# Patient Record
Sex: Male | Born: 1985 | Race: White | Hispanic: Yes | Marital: Single | Smoking: Never smoker
Health system: Southern US, Community
[De-identification: ages and names within clinical notes are randomized; demographics above are authoritative.]

## PROBLEM LIST (undated history)

## (undated) DIAGNOSIS — R519 Headache, unspecified: Secondary | ICD-10-CM

## (undated) DIAGNOSIS — I10 Essential (primary) hypertension: Secondary | ICD-10-CM

## (undated) DIAGNOSIS — E119 Type 2 diabetes mellitus without complications: Secondary | ICD-10-CM

## (undated) DIAGNOSIS — U071 COVID-19: Secondary | ICD-10-CM

## (undated) DIAGNOSIS — F1910 Other psychoactive substance abuse, uncomplicated: Secondary | ICD-10-CM

## (undated) DIAGNOSIS — K8020 Calculus of gallbladder without cholecystitis without obstruction: Secondary | ICD-10-CM

## (undated) DIAGNOSIS — F603 Borderline personality disorder: Secondary | ICD-10-CM

## (undated) HISTORY — PX: WISDOM TOOTH EXTRACTION: SHX21

## (undated) HISTORY — PX: CHOLECYSTECTOMY: SHX55

---
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
## 2006-03-17 ENCOUNTER — Ambulatory Visit: Payer: Self-pay | Admitting: Psychiatry

## 2006-03-17 ENCOUNTER — Inpatient Hospital Stay (HOSPITAL_COMMUNITY): Admission: AD | Admit: 2006-03-17 | Discharge: 2006-03-21 | Payer: Self-pay | Admitting: Psychiatry

## 2006-12-07 ENCOUNTER — Ambulatory Visit: Payer: Self-pay | Admitting: *Deleted

## 2006-12-07 ENCOUNTER — Inpatient Hospital Stay (HOSPITAL_COMMUNITY): Admission: AD | Admit: 2006-12-07 | Discharge: 2006-12-14 | Payer: Self-pay | Admitting: *Deleted

## 2009-01-11 ENCOUNTER — Inpatient Hospital Stay (HOSPITAL_COMMUNITY): Admission: RE | Admit: 2009-01-11 | Discharge: 2009-01-16 | Payer: Self-pay | Admitting: Psychiatry

## 2009-01-11 ENCOUNTER — Ambulatory Visit: Payer: Self-pay | Admitting: Psychiatry

## 2010-07-02 LAB — GLUCOSE, CAPILLARY
Glucose-Capillary: 110 mg/dL — ABNORMAL HIGH (ref 70–99)
Glucose-Capillary: 112 mg/dL — ABNORMAL HIGH (ref 70–99)
Glucose-Capillary: 120 mg/dL — ABNORMAL HIGH (ref 70–99)
Glucose-Capillary: 129 mg/dL — ABNORMAL HIGH (ref 70–99)
Glucose-Capillary: 149 mg/dL — ABNORMAL HIGH (ref 70–99)
Glucose-Capillary: 178 mg/dL — ABNORMAL HIGH (ref 70–99)
Glucose-Capillary: 190 mg/dL — ABNORMAL HIGH (ref 70–99)
Glucose-Capillary: 192 mg/dL — ABNORMAL HIGH (ref 70–99)
Glucose-Capillary: 204 mg/dL — ABNORMAL HIGH (ref 70–99)
Glucose-Capillary: 218 mg/dL — ABNORMAL HIGH (ref 70–99)
Glucose-Capillary: 255 mg/dL — ABNORMAL HIGH (ref 70–99)
Glucose-Capillary: 313 mg/dL — ABNORMAL HIGH (ref 70–99)
Glucose-Capillary: 317 mg/dL — ABNORMAL HIGH (ref 70–99)
Glucose-Capillary: 320 mg/dL — ABNORMAL HIGH (ref 70–99)
Glucose-Capillary: 344 mg/dL — ABNORMAL HIGH (ref 70–99)
Glucose-Capillary: 370 mg/dL — ABNORMAL HIGH (ref 70–99)
Glucose-Capillary: 382 mg/dL — ABNORMAL HIGH (ref 70–99)
Glucose-Capillary: 390 mg/dL — ABNORMAL HIGH (ref 70–99)
Glucose-Capillary: 49 mg/dL — ABNORMAL LOW (ref 70–99)
Glucose-Capillary: 53 mg/dL — ABNORMAL LOW (ref 70–99)
Glucose-Capillary: 53 mg/dL — ABNORMAL LOW (ref 70–99)
Glucose-Capillary: 57 mg/dL — ABNORMAL LOW (ref 70–99)
Glucose-Capillary: 85 mg/dL (ref 70–99)
Glucose-Capillary: 85 mg/dL (ref 70–99)
Glucose-Capillary: 90 mg/dL (ref 70–99)
Glucose-Capillary: 96 mg/dL (ref 70–99)

## 2010-07-02 LAB — COMPREHENSIVE METABOLIC PANEL
ALT: 25 U/L (ref 0–53)
AST: 24 U/L (ref 0–37)
Albumin: 4.2 g/dL (ref 3.5–5.2)
Alkaline Phosphatase: 115 U/L (ref 39–117)
BUN: 10 mg/dL (ref 6–23)
CO2: 31 mEq/L (ref 19–32)
Calcium: 9.6 mg/dL (ref 8.4–10.5)
Chloride: 103 mEq/L (ref 96–112)
Creatinine, Ser: 0.59 mg/dL (ref 0.4–1.5)
GFR calc Af Amer: >60 mL/min (ref >60)
GFR calc non Af Amer: >60 mL/min (ref >60)
Glucose, Bld: 50 mg/dL — ABNORMAL LOW (ref 70–99)
Potassium: 3.9 mEq/L (ref 3.5–5.1)
Sodium: 140 mEq/L (ref 135–145)
Total Bilirubin: 0.6 mg/dL (ref 0.3–1.2)
Total Protein: 7.7 g/dL (ref 6.0–8.3)

## 2010-07-02 LAB — CBC
HCT: 43 % (ref 39.0–52.0)
Hemoglobin: 14.5 g/dL (ref 13.0–17.0)
MCHC: 33.7 g/dL (ref 30.0–36.0)
MCV: 91.1 fL (ref 78.0–100.0)
Platelets: 309 10*3/uL (ref 150–400)
RBC: 4.72 MIL/uL (ref 4.22–5.81)
RDW: 13 % (ref 11.5–15.5)
WBC: 7.4 10*3/uL (ref 4.0–10.5)

## 2010-07-02 LAB — TSH: TSH: 2.37 u[IU]/mL (ref 0.350–4.500)

## 2010-08-11 NOTE — Discharge Summary (Signed)
NAME:  Raymond [name], Raymond [name] NO.:  [id number]   MEDICAL RECORD NO.:  [id number]          PATIENT TYPE:  IPS   LOCATION:  0507                          FACILITY:  BH   PHYSICIAN:  Jasmine Pang, M.D. DATE OF BIRTH:  [birth year]   DATE OF ADMISSION:  12/07/2006  DATE OF DISCHARGE:  12/14/2006                               DISCHARGE SUMMARY   IDENTIFICATION:  This is a 25 year old white male who was admitted on a  voluntary basis from [facility] ED on December 07, 2006.   HISTORY OF PRESENT ILLNESS:  The patient states he was at [employer] and  stole a bottle of maximum strength of Mucinex.  He took all the pills in  the bathroom.  Their staff realized that there was something amiss and  found his empty bottle of pills.  Security was called and apprehended  him.  Then, 911 was called and he was taken to the ED at [facility].  He states he has lost his job because of insubordination to  his supervisor.  He has been abusing dextromethorphan about 20 to 30  tablets on a binge in the past 2 weeks daily with a break about every  fourth day.  He has no transportation.  He has a poor relationship with  his father.  He sees Dr. Bayard Males in [place].  This is the third or  fourth [facility] admission.  He was at [facility] in 2006  and 2007.  He has 2 pending charges for paraphernalia.  He has cyber-  stalking charge.  He also states he shoplifts.  He says his mother has  bipolar disorder.  He drinks alcohol every 2 to 3 weeks as indicated  above.  He abuses dextromethorphan.  He has diabetes type 1,  uncontrolled, and migraine headaches.  He is supposed to be on Topamax,  but he is not taking this.  He is also on insulin 70/30, 30 units in the  morning and at h.s.  He is allergic to IBUPROFEN.  He has been on  numerous psychiatric medicines in the past including Cymbalta, which  helped; Effexor, which gave him withdrawal effects; Depakote, which  caused him to be drowsy; Seroquel caused him to be tired; Lexapro  worsened his symptoms; Prozac; and gabapentin.   PHYSICAL EXAMINATION:  This was done at the [facility] ED.  He  was given 1 g/kg of charcoal for the overdose on dextromethorphan  (Mucinex).  There were no other acute physical problems.   DIAGNOSTIC STUDIES:  Included a BMET, which had a slightly decreased CO2  of 95 and a slightly decreased creatinine of 0.57, platelets were  slightly increased to 285,000.  CBC was within normal limits.  Alcohol  level was 0.5.  UDS was negative.  Liver profile was remarkable for an  AST of 105 and an ALT of 171 and alkaline phosphatase of 191.   HOSPITAL COURSE:  Upon admission, the patient was started on a NovoLog  glycemic control protocol at a moderate strength.  He was also started  on Topamax 25 mg p.o.  daily and trazodone 50 mg p.o. q.h.s.  He was  started on 70/30 insulin 30 units in the morning and 30 units at p.m.  Topamax was discontinued and trazodone was discontinued.  He was instead  started on Ambien 5 mg p.o. q.h.s. 1 to 2 tablets p.r.n.  He was started  on Lamisil 1% cream to affected ringworm area on his arms b.i.d.  On  December 09, 2006, he was started on Celexa 20 mg p.o. daily and  Risperdal 0.5 mg p.o. q.h.s.  He was also started on Risperdal 0.5 mg M-  Tabs p.o. q.6h. p.r.n. anxiety.  The patient tolerated these medications  well with no significant side effects.  He was appropriate in the  hospital and able to participate in unit therapeutic groups and  activities.  He was talkative and cooperative with me.  He states he  feels he is bipolar.  He had no current psychiatric treatment.  He  admitted to be using dextromethorphan, alcohol, marijuana, mushrooms,  cocaine, and ecstasy.  He discussed his demoralization about the poor  relationship he has with his father.  He states his father would not let  him see his brother because he is afraid he will turn  out like me.  He  had some difficulty sleeping with middle of the night awakening.  Mood  continued to be somewhat angry and depressed and anxious.  Initially, as  hospitalization progressed, his mental status improved.  He was still  somewhat anxious with some dysphoria, but improved from admission  status.  He was still having some middle of the night awakening.  There  was no suicidal or homicidal ideation.  No auditory or visual  hallucinations.  He talked about the breakup with his girlfriend, which  he states had been very difficult for him.  He plans to go home to live  with his older brother.  On December 14, 2006, mental status had  improved markedly from admission status.  The patient was friendly and  cooperative with good eye contact.  Speech was normal rate and flow.  Psychomotor activity was within normal limits.  Mood, euthymic.  Affect,  wide range.  There was no suicidal or homicidal ideation.  No thoughts  of self-injurious behavior.  No auditory or visual hallucinations.  No  paranoia or delusions.  Thoughts were logical and goal directed.  Thought content, no predominant theme.  Cognitive was grossly back to  baseline.  It was felt the patient was safe to be discharged home today  to live with his brother.   DISCHARGE DIAGNOSES:  AXIS I:  Bipolar disorder, not otherwise  specified.  AXIS II:  Features of personality disorder, not otherwise specified.  AXIS III:  Ringworm on his left arm, diabetes mellitus type 1,  uncontrolled.  AXIS IV:  Severe (problems with primary support group, housing problem,  economic problem, problems related to legal system, medical problems,  burden of psychiatric illness).  AXIS V:  Global assessment of functioning upon discharge was 50.  Global  assessment of functioning upon admission 41.  Global assessment of  functioning highest past year 60 to 65.   DISCHARGE PLANS:  There were no specific activity level or dietary   restrictions.   POST HOSPITAL CARE PLANS:  The patient will be seen at the [facility] [place] Thursday, December 15, 2006, at 9:00 a.m.   DISCHARGE MEDICATIONS:  1. He was started on insulin NovoLog  mix 70/30, 30 units in the      morning and 15 units at 5:00 p.m.  2. Lamisil 1% cream twice daily to affected area.  3. Celexa 20 mg daily.  4. Risperdal 0.5 mg M-Tab at bedtime.  5. Ambien 5 mg at bedtime if needed.   He was also directed to go to the [facility] to establish  medical care for his diabetes.      Jasmine Pang, M.D.  Electronically Signed     BHS/MEDQ  D:  12/14/2006  T:  12/14/2006  Job:  [phone]

## 2010-08-14 NOTE — Discharge Summary (Signed)
NAME:  Raymond [name], Raymond [name]              ACCOUNT NO.:  [id number]   MEDICAL RECORD NO.:  [id number]          PATIENT TYPE:  IPS   LOCATION:  0502                          FACILITY:  [facility]   PHYSICIAN:  Anselm Jungling, MD  DATE OF BIRTH:  1985-08-14   DATE OF ADMISSION:  03/17/2006  DATE OF DISCHARGE:  03/21/2006                               DISCHARGE SUMMARY   IDENTIFYING DATA AND REASON FOR ADMISSION:  The patient is a 25 year old  single Caucasian male who presented with lacerations that he initially  reported were from falling down a well and being attacked by a mountain  lion.  He subsequently admitted that this was not true.  He apparently  had inflicted the injuries to communicate something to his girlfriend.  He had been taking Effexor prior to admission, but stopped during the  week before he came in.  He had no known chemical dependency issues.  Please refer to the admission note for further details pertaining to the  symptoms, circumstances and history that led to his hospitalization.  He  was given an initial Axis I diagnosis of bipolar disorder by history,  currently depressed, and rule out adjustment disorder with mixed  disturbance of emotions and conduct.   MEDICAL AND LABORATORY:  The patient had his laceration injuries treated  at the emergency department with suturing.  He presented with a bandaged  left arm.  He was alert, fully oriented, pleasant and polite, and once  he disavowed the mountain lion story, appeared to be a good historian.  His mood was depressed with a flat affect.  There were no signs or  symptoms of psychosis or thought disorder.  He denied any suicidal  ideation, and verbalized a desire for help.   He was restarted on his usual Effexor XR 150 mg daily.  To further  stabilize his bipolar disorder, he was started on a trial of Depakote at  bedtime.   He was involved in various therapeutic groups and activities designed  help him acquire better  coping skills, a better understanding of his  underlying disorders and dynamics, and the development of an aftercare  plan.   The patient came to [place] with insulin-dependent diabetes mellitus, and had  significant blood pressure fluctuations over the first 24 hours of his  stay.  This was reviewed with the nurse practitioner, and the diabetic  care coordinator for the hospital was contacted.   On the fourth hospital day there was a family session involving the  patient and his mother.  They discussed helping the patient to have  appropriate support in managing stress when he goes home.  They  discussed the diagnosis of bipolar disorder.  Coping skills were  discussed.  The family and patient were given information on suicide  prevention as well as the suicide prevention pamphlet and the crisis  hotline numbers card, which they agreed to use if needed.   The patient did reasonably well in the milieu program, and by the fifth  hospital day appeared appropriate for discharge.  He agreed to the  following aftercare plan.  AFTERCARE:  The patient was to follow-up with [facility] with an appointment on April 11, 2006 with Bartholomew Crews, and  with Dr. __________ on April 05, 2006.   DISCHARGE MEDICATIONS:  Effexor XR 150 mg daily, Depakote ER 500 mg  q.h.s., and his usual insulin regimen.   He was encouraged to follow up regarding his wound care and diabetic  management with his usual physician.   DISCHARGE DIAGNOSES:  AXIS I:  Bipolar disorder, type 2, most recently  depressed.   AXIS II: Deferred.   AXIS III: Self-inflicted lacerations.   AXIS IV: Stressors severe.   AXIS V: GAF on discharge 60.      Anselm Jungling, MD  Electronically Signed     SPB/MEDQ  D:  04/08/2006  T:  04/10/2006  Job:  [id number]

## 2010-08-14 NOTE — Discharge Summary (Signed)
NAME:  Raymond [name], Raymond [name]              ACCOUNT NO.:  [id number]   MEDICAL RECORD NO.:  [id number]          PATIENT TYPE:  IPS   LOCATION:  0502                          FACILITY:  BH   PHYSICIAN:  Geoffery Lyons, M.D.      DATE OF BIRTH:  1985/05/04   DATE OF ADMISSION:  08/19/2004  DATE OF DISCHARGE:  08/24/2004                                 DISCHARGE SUMMARY   CHIEF COMPLAINT AND PRESENT ILLNESS:  This was the first admission to [facility] for this 25 year old Hispanic male, single,  involuntarily committed.  Apparently, he has not been taking his insulin for  two or three days, became groggy at admission.  Diabetes mellitus was  uncontrolled.  He was admitted to the ICU and medically stabilized.  Endorsed mood swings with depression, hopelessness since high school, more  so since graduation due to poor support system.  Anxious, cutting on arms,  living out of a car, struggling with sexuality issues and diabetes mellitus  management, poor support.   PAST PSYCHIATRIC HISTORY:  First time at [facility].  No prior  admissions.  Endorsed mood swings since high school.  PCP tried Lamictal but  did not work according to his report.   ALCOHOL/DRUG HISTORY:  Pops Ritalin occasionally.   MEDICAL HISTORY:  Diabetes mellitus, insulin-dependent age 36, neuropathy  both feet.   MEDICATIONS:  Insulin 70/30 30 units in the morning, 12 units at suppertime.   PHYSICAL EXAMINATION:  Performed and failed to show any acute findings.   LABORATORY DATA:  CBC within normal limits.  Blood chemistry with glucose  398.  Liver enzymes with SGOT 39, SGPT 28, alkaline phosphatase 124,  hemoglobin A1C 13.7.  Hepatitis apparently was negative.  TSH 1.871.   MENTAL STATUS EXAM:  Fully alert, pleasant, cooperative male with lip  piercings.  Polite, bright affect, somewhat expansive.  Speech normal rate,  tempo and production.  Mood elevated.  Affect broad with some giddiness.  Thought  processes logical, coherent and relevant.  Suicidal ruminations.  No  active plan.  Endorsed racing thoughts and agitation.  No delusions.  No  hallucinations.  Cognition was well-preserved.   ADMISSION DIAGNOSES:   AXIS I:  Rule out bipolar disorder, depressed.   AXIS II:  No diagnosis.   AXIS III:  1.  Diabetes mellitus, type 1.  2.  Tinea corporis.  3.  Diabetic neuropathy.   AXIS IV:  Moderate.   AXIS V:  Global Assessment of Functioning upon admission 30; highest Global  Assessment of Functioning in the last year 65-70.   HOSPITAL COURSE:  He was admitted.  He was started in individual and group  psychotherapy.  He was initially given Ambien for sleep.  Maintained on his  insulin Novolin 70/30 30 units in the morning, 12 units in the afternoon.  Internal medicine was consulted due to uncontrolled diabetes.  We followed  the recommendations.  He was started on Lexapro 5 mg per day and Depakote ER  250 mg twice a day.  He did endorse that he might have been upset with his  diabetes mellitus and said that he would rather be dead.  He had said he  would not kill himself.  Endorsed cutting as a way of dealing with the  emotional pain.  Not to hurt himself.  Endorsed he has been living by  himself in a car.  Works in a Research scientist (medical).  Claimed he liked it.  Also endorsed that he was ready to move on.  He was going back to school  next semester.  Could not afford to go immediately after high school due to  financial difficulties.  The Lamictal trial that was already discussed, he  claimed, made him feel worse.  Endorsed that at times, he got too high,  different from feeling normal.  Endorsed multiple traumatic events in his  life that have affected him.  He did admit to feeling very anxious around  other people, having a hard time opening up.  We continued to work with him.  He readily started feeling better.  On May 29th, he was in full contact with  reality.  There were  no suicidal ideation, no homicidal ideation, no  hallucinations, no delusions.  Endorsed he was feeling much better.  He was  motivated to doing better.  Endorsed the worse time was when he was by  himself and he gets lonely.  He feels more depressed but felt that he had  worked enough on himself and had enough coping skills that he could handle  it this time around much better.   DISCHARGE DIAGNOSES:   AXIS I:  Mood disorder not otherwise specified.   AXIS II:  No diagnosis.   AXIS III:  1.  Diabetes mellitus, type 1.  2.  Tinea corporis.  3.  Status post acute diabetic ketoacidosis.  4.  Diabetic neuropathy.   AXIS IV:  Moderate.   AXIS V:  Global Assessment of Functioning upon discharge 50-55.   DISCHARGE MEDICATIONS:  1.  Lexapro 10 mg, 1/2 daily.  2.  Novolin 40 units subcutaneous at 7 a.m.  3.  Novolin 20 units subcutaneous at 5 p.m.   FOLLOW UP:  [facility].       IL/MEDQ  D:  09/21/2004  T:  09/22/2004  Job:  [id number]

## 2010-11-05 ENCOUNTER — Ambulatory Visit (HOSPITAL_COMMUNITY)
Admission: RE | Admit: 2010-11-05 | Discharge: 2010-11-05 | Disposition: A | Discharge status: Home / Self Care | Payer: Self-pay | Attending: Psychiatry | Admitting: Psychiatry

## 2010-11-05 DIAGNOSIS — F39 Unspecified mood [affective] disorder: Secondary | ICD-10-CM | POA: Insufficient documentation

## 2011-01-08 LAB — HEPATIC FUNCTION PANEL
ALT: 70 — ABNORMAL HIGH
AST: 33
Albumin: 3.3 — ABNORMAL LOW
Alkaline Phosphatase: 141 — ABNORMAL HIGH
Bilirubin, Direct: 0.1
Indirect Bilirubin: 0.4
Total Bilirubin: 0.5
Total Protein: 6.3

## 2011-01-08 LAB — HEPATITIS PANEL, ACUTE
HCV Ab: NEGATIVE
Hep A IgM: NEGATIVE
Hep B C IgM: NEGATIVE
Hepatitis B Surface Ag: NEGATIVE

## 2011-01-08 LAB — GLUCOSE, RANDOM: Glucose, Bld: 459 — ABNORMAL HIGH

## 2012-03-29 DIAGNOSIS — E119 Type 2 diabetes mellitus without complications: Secondary | ICD-10-CM

## 2012-03-29 HISTORY — DX: Type 2 diabetes mellitus without complications: E11.9

## 2014-07-21 ENCOUNTER — Emergency Department (HOSPITAL_COMMUNITY)
Admission: EM | Admit: 2014-07-21 | Discharge: 2014-07-22 | Disposition: A | Discharge status: Home / Self Care | Payer: Self-pay | Attending: Emergency Medicine | Admitting: Emergency Medicine

## 2014-07-21 ENCOUNTER — Encounter (HOSPITAL_COMMUNITY): Payer: Self-pay | Admitting: Emergency Medicine

## 2014-07-21 ENCOUNTER — Emergency Department (HOSPITAL_COMMUNITY): Payer: Self-pay

## 2014-07-21 DIAGNOSIS — Z8719 Personal history of other diseases of the digestive system: Secondary | ICD-10-CM | POA: Insufficient documentation

## 2014-07-21 DIAGNOSIS — E119 Type 2 diabetes mellitus without complications: Secondary | ICD-10-CM | POA: Insufficient documentation

## 2014-07-21 DIAGNOSIS — T1491XA Suicide attempt, initial encounter: Secondary | ICD-10-CM

## 2014-07-21 DIAGNOSIS — F332 Major depressive disorder, recurrent severe without psychotic features: Secondary | ICD-10-CM | POA: Insufficient documentation

## 2014-07-21 DIAGNOSIS — Z720 Tobacco use: Secondary | ICD-10-CM | POA: Insufficient documentation

## 2014-07-21 DIAGNOSIS — Z794 Long term (current) use of insulin: Secondary | ICD-10-CM | POA: Insufficient documentation

## 2014-07-21 DIAGNOSIS — R45851 Suicidal ideations: Secondary | ICD-10-CM

## 2014-07-21 HISTORY — DX: Calculus of gallbladder without cholecystitis without obstruction: K80.20

## 2014-07-21 LAB — COMPREHENSIVE METABOLIC PANEL
ALT: 22 U/L (ref 0–53)
AST: 20 U/L (ref 0–37)
Albumin: 3 g/dL — ABNORMAL LOW (ref 3.5–5.2)
Alkaline Phosphatase: 100 U/L (ref 39–117)
Anion gap: 5 (ref 5–15)
BUN: 11 mg/dL (ref 6–23)
CO2: 26 mmol/L (ref 19–32)
Calcium: 8 mg/dL — ABNORMAL LOW (ref 8.4–10.5)
Chloride: 110 mmol/L (ref 96–112)
Creatinine, Ser: 0.78 mg/dL (ref 0.50–1.35)
GFR calc Af Amer: >90 mL/min (ref >90)
GFR calc non Af Amer: >90 mL/min (ref >90)
Glucose, Bld: 154 mg/dL — ABNORMAL HIGH (ref 70–99)
Potassium: 3.3 mmol/L — ABNORMAL LOW (ref 3.5–5.1)
Sodium: 141 mmol/L (ref 135–145)
Total Bilirubin: 0.3 mg/dL (ref 0.3–1.2)
Total Protein: 5.3 g/dL — ABNORMAL LOW (ref 6.0–8.3)

## 2014-07-21 LAB — CBG MONITORING, ED
Glucose-Capillary: 222 mg/dL — ABNORMAL HIGH (ref 70–99)
Glucose-Capillary: 42 mg/dL — CL (ref 70–99)
Glucose-Capillary: 56 mg/dL — ABNORMAL LOW (ref 70–99)
Glucose-Capillary: 177 mg/dL — ABNORMAL HIGH (ref 70–99)
Glucose-Capillary: 245 mg/dL — ABNORMAL HIGH (ref 70–99)

## 2014-07-21 LAB — RAPID URINE DRUG SCREEN, HOSP PERFORMED
Amphetamines: NOT DETECTED
Barbiturates: NOT DETECTED
Benzodiazepines: NOT DETECTED
Cocaine: NOT DETECTED
Opiates: NOT DETECTED
Tetrahydrocannabinol: NOT DETECTED

## 2014-07-21 LAB — I-STAT CHEM 8, ED
BUN: 13 mg/dL (ref 6–23)
Calcium, Ion: 1.28 mmol/L — ABNORMAL HIGH (ref 1.12–1.23)
Chloride: 100 mmol/L (ref 96–112)
Creatinine, Ser: 1 mg/dL (ref 0.50–1.35)
Glucose, Bld: 496 mg/dL — ABNORMAL HIGH (ref 70–99)
HCT: 46 % (ref 39.0–52.0)
Hemoglobin: 15.6 g/dL (ref 13.0–17.0)
Potassium: 3.9 mmol/L (ref 3.5–5.1)
Sodium: 138 mmol/L (ref 135–145)
TCO2: 24 mmol/L (ref 0–100)

## 2014-07-21 LAB — CBC WITH DIFFERENTIAL/PLATELET
Basophils Absolute: 0 10*3/uL (ref 0.0–0.1)
Basophils Absolute: 0 10*3/uL (ref 0.0–0.1)
Basophils Relative: 0 % (ref 0–1)
Basophils Relative: 1 % (ref 0–1)
Eosinophils Absolute: 0.1 10*3/uL (ref 0.0–0.7)
Eosinophils Absolute: 0.1 10*3/uL (ref 0.0–0.7)
Eosinophils Relative: 1 % (ref 0–5)
Eosinophils Relative: 2 % (ref 0–5)
HCT: 43.8 % (ref 39.0–52.0)
HCT: 36.1 % — ABNORMAL LOW (ref 39.0–52.0)
Hemoglobin: 15 g/dL (ref 13.0–17.0)
Hemoglobin: 12.4 g/dL — ABNORMAL LOW (ref 13.0–17.0)
Lymphocytes Relative: 41 % (ref 12–46)
Lymphocytes Relative: 45 % (ref 12–46)
Lymphs Abs: 3.1 10*3/uL (ref 0.7–4.0)
Lymphs Abs: 2.8 10*3/uL (ref 0.7–4.0)
MCH: 29.2 pg (ref 26.0–34.0)
MCH: 29.5 pg (ref 26.0–34.0)
MCHC: 34.2 g/dL (ref 30.0–36.0)
MCHC: 34.3 g/dL (ref 30.0–36.0)
MCV: 85.4 fL (ref 78.0–100.0)
MCV: 86 fL (ref 78.0–100.0)
Monocytes Absolute: 0.5 10*3/uL (ref 0.1–1.0)
Monocytes Absolute: 0.5 10*3/uL (ref 0.1–1.0)
Monocytes Relative: 6 % (ref 3–12)
Monocytes Relative: 8 % (ref 3–12)
Neutro Abs: 3.9 10*3/uL (ref 1.7–7.7)
Neutro Abs: 2.8 10*3/uL (ref 1.7–7.7)
Neutrophils Relative %: 52 % (ref 43–77)
Neutrophils Relative %: 45 % (ref 43–77)
Platelets: 364 10*3/uL (ref 150–400)
Platelets: 298 10*3/uL (ref 150–400)
RBC: 5.13 MIL/uL (ref 4.22–5.81)
RBC: 4.2 MIL/uL — ABNORMAL LOW (ref 4.22–5.81)
RDW: 12 % (ref 11.5–15.5)
RDW: 12 % (ref 11.5–15.5)
WBC: 7.6 10*3/uL (ref 4.0–10.5)
WBC: 6.1 10*3/uL (ref 4.0–10.5)

## 2014-07-21 LAB — I-STAT CG4 LACTIC ACID, ED
Lactic Acid, Venous: 1.75 mmol/L (ref 0.5–2.0)
Lactic Acid, Venous: 0.97 mmol/L (ref 0.5–2.0)

## 2014-07-21 LAB — ACETAMINOPHEN LEVEL
Acetaminophen (Tylenol), Serum: <10 ug/mL — ABNORMAL LOW (ref 10–30)
Acetaminophen (Tylenol), Serum: <10 ug/mL — ABNORMAL LOW (ref 10–30)

## 2014-07-21 LAB — CBC
HCT: 38 % — ABNORMAL LOW (ref 39.0–52.0)
Hemoglobin: 12.8 g/dL — ABNORMAL LOW (ref 13.0–17.0)
MCH: 29.1 pg (ref 26.0–34.0)
MCHC: 33.7 g/dL (ref 30.0–36.0)
MCV: 86.4 fL (ref 78.0–100.0)
Platelets: 313 10*3/uL (ref 150–400)
RBC: 4.4 MIL/uL (ref 4.22–5.81)
RDW: 12.1 % (ref 11.5–15.5)
WBC: 6.5 10*3/uL (ref 4.0–10.5)

## 2014-07-21 LAB — PROTIME-INR
INR: 0.82 (ref 0.00–1.49)
Prothrombin Time: 11.3 seconds — ABNORMAL LOW (ref 11.6–15.2)

## 2014-07-21 LAB — LIPASE, BLOOD: Lipase: 26 U/L (ref 11–59)

## 2014-07-21 LAB — ETHANOL: Alcohol, Ethyl (B): <5 mg/dL (ref 0–9)

## 2014-07-21 LAB — SALICYLATE LEVEL: Salicylate Lvl: <4 mg/dL (ref 2.8–20.0)

## 2014-07-21 MED ORDER — SODIUM CHLORIDE 0.9 % IV BOLUS (SEPSIS)
1000.0000 mL | Freq: Once | INTRAVENOUS | Status: AC
  Administered 2014-07-21: 1000 mL via INTRAVENOUS

## 2014-07-21 MED ORDER — ONDANSETRON HCL 4 MG PO TABS: 4.0000 mg | ORAL_TABLET | Freq: Three times a day (TID) | ORAL | Status: DC | PRN

## 2014-07-21 MED ORDER — ALUM & MAG HYDROXIDE-SIMETH 200-200-20 MG/5ML PO SUSP: 30.0000 mL | ORAL | Status: DC | PRN

## 2014-07-21 NOTE — ED Notes (Signed)
Attempted to call report to TCU. Will try back

## 2014-07-21 NOTE — ED Notes (Signed)
Resting quietly with eye closed. Easily arousable. Verbally responsive. Resp even and unlabored. ABC's intact. No behavior problems noted. NAD noted. IV saline lock patent and intact. Family reported plans to leave but left contact numbers : [phone] and [phone].

## 2014-07-21 NOTE — BHH Counselor (Signed)
Contacted the following facilities for placement:  NO APPROPRIATE BEDS CURRENTLY AVAILABLE.  [facility]: [facility], [facility], per [facility], per [facility], per [facility], per [facility], per [facility], per [facility], per [facility], per [facility], per [facility], per [facility] Rad[facility], per [facility], per [facility] per [facility], per [facility], per [facility], per Jonah  NO RESPONSE: [facility]   [address]Raejean Swinford [facility], [facility], [facility], [facility] Triage Specialist [phone]

## 2014-07-21 NOTE — ED Notes (Signed)
Chales AbrahamsMary Ann with poison controlled called and given update on pt's condition.

## 2014-07-21 NOTE — BH Assessment (Addendum)
Tele Assessment Note   Raymond [name] is an 29 y.o. male who presents to [facility] accompanied by mother, [name] and brother, Irving ShowsMiguel. Patient reports suicidal attempt by drinking bleach after argument with his wife. Patient reported gesture was "a self harming attempt and if it would have ended in suicide that would have been fine." Patient stated he has been having ongoing issues since 29 y/o and reported multiple suicide attempts in the past. Patient identified current stressors as his wife, work and fiances. Patient endorses depressive sx such as fatigue, irritability, worthlessness and guilt. Patient stated he sleeps more than 12 hours a day. Patient denies HI and AVH. Patient reported ongoing use of cough medication when he gets in arguments with wife. Patient stated he will take 16 -30mg  tablets, sometimes daily depending on level of stress. Patient denies any other drug use.  Patient Ox4. Patient presents with flat, depressed mood. 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 Patient currently living with his wife and 29 year old daughter. Patient reported being diabetic and taking over the counter insulin. Patient stated he currently does not see a MD for insulin.   Patient reports 5 previous hospitalizations with most recent in July 2015 at [facility]. Patient stated he last had outpatient therapy with therapist in [place] in 2009 but no current outpatient.   Mother and brother discuss concern about patient's dependence on cough medication with him having 314 year old daughter. Patient stated he is open for outpatient tx following inpatient stabilization.   Axis I: Mood Disorder Unspecified Axis II: Borderline Personality Disorder (by patient account)  Past Medical History:  Past Medical History  Diagnosis Date  . Gallstones     Past Surgical History  Procedure Laterality Date  . Cholecystectomy      Family History: History reviewed. No  pertinent family history.  Social History:  reports that he has been smoking.  He has never used smokeless tobacco. He reports that he drinks alcohol. He reports that he does not use illicit drugs.  Additional Social History:  Alcohol / Drug Use Pain Medications: none Prescriptions: none Over the Counter: Insulin (for diabetes) History of alcohol / drug use?: Yes Longest period of sobriety (when/how long): unk Negative Consequences of Use: Personal relationships Substance #1 Name of Substance 1: cough medicine 1 - Age of First Use: 22 1 - Amount (size/oz): 16- 30mg  tablets 1 - Frequency: daily- occasional "depending on level of stress" 1 - Duration: ongoing 1 - Last Use / Amount: 07/20/14  CIWA: CIWA-Ar BP: (!) 158/101 mmHg Pulse Rate: 96 COWS:    PATIENT STRENGTHS: (choose at least two) Ability for insight Average or above average intelligence Communication skills Work skills  Allergies:  Allergies  Allergen Reactions  . Tramadol Other (See Comments)    Hypoglycemia Shock  . Ibuprofen Swelling    Home Medications:  (Not in a hospital admission)  OB/GYN Status:  No LMP for male patient.  General Assessment Data Location of Assessment: [facility] Is this a Tele or Face-to-Face Assessment?: Tele Assessment Is this an Initial Assessment or a Re-assessment for this encounter?: Initial Assessment Living Arrangements: Spouse/significant other Can pt return to current living arrangement?: Yes Admission Status: Voluntary Is patient capable of signing voluntary admission?: Yes Transfer from: Acute Hospital Referral Source: Self/Family/Friend     [facility] Living Arrangements: Spouse/significant other Name of Psychiatrist: none Name of Therapist: none     Risk  to self with the past 6 months Suicidal Ideation: Yes-Currently Present Suicidal Intent: Yes-Currently Present Is patient at risk for suicide?: Yes Suicidal Plan?: Yes-Currently Present Specify  Current Suicidal Plan: drinking bleach Access to Means: Yes Specify Access to Suicidal Means: bleach in the home What has been your use of drugs/alcohol within the last 12 months?: cough meds Previous Attempts/Gestures: Yes How many times?:  (multiple) Other Self Harm Risks: unk Triggers for Past Attempts: Spouse contact Intentional Self Injurious Behavior: None Family Suicide History: Unknown Recent stressful life event(s): Conflict (Comment) (Conflict with wife) Persecutory voices/beliefs?: No Depression: Yes Depression Symptoms: Despondent, Fatigue, Guilt, Loss of interest in usual pleasures, Feeling worthless/self pity, Feeling angry/irritable Substance abuse history and/or treatment for substance abuse?: Yes Suicide prevention information given to non-admitted patients: Not applicable  Risk to Others within the past 6 months Homicidal Ideation: No Thoughts of Harm to Others: No Current Homicidal Intent: No Current Homicidal Plan: No Access to Homicidal Means: No Identified Victim: NA History of harm to others?: No Assessment of Violence: None Noted Violent Behavior Description: Patient calm and cooperative during assessment.  Does patient have access to weapons?: No Criminal Charges Pending?: No Does patient have a court date: No  Psychosis Hallucinations: None noted Delusions: None noted  Mental Status Report Appearance/Hygiene: In scrubs Eye Contact: Good Motor Activity: Unremarkable Speech: Logical/coherent Level of Consciousness: Alert Mood: Depressed, Anxious Affect: Anxious, Depressed Anxiety Level: Minimal Thought Processes: Coherent, Relevant Judgement: Impaired Orientation: Person, Place, Time, Situation Obsessive Compulsive Thoughts/Behaviors: Minimal  Cognitive Functioning Concentration: Normal Memory: Recent Intact, Remote Intact IQ: Average Insight: Good Impulse Control: Poor Appetite: Poor Weight Loss:  (unk) Weight Gain:  (unk) Sleep:  Increased Total Hours of Sleep: 12 Vegetative Symptoms: Staying in bed  ADLScreening [facility]) Patient's cognitive ability adequate to safely complete daily activities?: Yes Patient able to express need for assistance with ADLs?: Yes Independently performs ADLs?: Yes (appropriate for developmental age)  Prior Inpatient Therapy Prior Inpatient Therapy: Yes Prior Therapy Dates: most recent 09/2013 Prior Therapy Facilty/Provider(s): [facility] Reason for Treatment: suicidal attempt  Prior Outpatient Therapy Prior Outpatient Therapy: Yes Prior Therapy Dates: 2009 Prior Therapy Facilty/Provider(s): [facility]- [facility] Reason for Treatment: BPD  ADL Screening (condition at time of admission) Patient's cognitive ability adequate to safely complete daily activities?: Yes Is the patient deaf or have difficulty hearing?: No Does the patient have difficulty seeing, even when wearing glasses/contacts?: No Does the patient have difficulty concentrating, remembering, or making decisions?: No Patient able to express need for assistance with ADLs?: Yes Independently performs ADLs?: Yes (appropriate for developmental age)       Abuse/Neglect Assessment (Assessment to be complete while patient is alone) Physical Abuse: Denies Verbal Abuse: Denies Sexual Abuse: Denies Exploitation of patient/patient's resources: Denies     Merchant navy officer (For Healthcare) Does patient have an advance directive?: No    Additional Information 1:1 In Past 12 Months?: No CIRT Risk: No Elopement Risk: No Does patient have medical clearance?: Yes   Disposition: To be evaluated by psychiatry this AM for disposition.  Disposition Initial Assessment Completed for this Encounter: Yes Disposition of Patient: Other dispositions Other disposition(s): Other (Comment) (Clinician to review with extender)  Saleena Tamas R 07/21/2014 8:17 AM

## 2014-07-21 NOTE — Progress Notes (Signed)
Clinician contacted Dr. Nicanor AlconPalumbo to gather information prior to assessment. MD reported patient presents to ED due to overdose attempt by drinking bleach.  Patient reports several previous suicide attempts in the past. [facility].    Clinician contacted Marylee FlorasBlanche, RN to set up tele assessment. Assessment to be initiated in 10 mins.   Nira Retortelilah Tunya Held, MSW, LCSW Triage Specialist [phone],

## 2014-07-21 NOTE — ED Provider Notes (Signed)
Patient signed out to follow-up repeat labs and reassess. Patient had multiple repeat labs and hemoglobin stabilized at 12, patient denies any vomiting, no blood in the stools, vitals unremarkable. Patient well-appearing on recheck. Poison control called by nursing for updated labs. Patient medically clear at this time and final recommendations per behavioral health for psychiatric care/inpatient. Pt tolerating po.   Labs Reviewed  PROTIME-INR - Abnormal; Notable for the following:    Prothrombin Time 11.3 (*)    All other components within normal limits  ACETAMINOPHEN LEVEL - Abnormal; Notable for the following:    Acetaminophen (Tylenol), Serum <10.0 (*)    All other components within normal limits  ACETAMINOPHEN LEVEL - Abnormal; Notable for the following:    Acetaminophen (Tylenol), Serum <10.0 (*)    All other components within normal limits  CBC WITH DIFFERENTIAL/PLATELET - Abnormal; Notable for the following:    RBC 4.20 (*)    Hemoglobin 12.4 (*)    HCT 36.1 (*)    All other components within normal limits  COMPREHENSIVE METABOLIC PANEL - Abnormal; Notable for the following:    Potassium 3.3 (*)    Glucose, Bld 154 (*)    Calcium 8.0 (*)    Total Protein 5.3 (*)    Albumin 3.0 (*)    All other components within normal limits  CBC - Abnormal; Notable for the following:    Hemoglobin 12.8 (*)    HCT 38.0 (*)    All other components within normal limits  I-STAT CHEM 8, ED - Abnormal; Notable for the following:    Glucose, Bld 496 (*)    Calcium, Ion 1.28 (*)    All other components within normal limits  CBG MONITORING, ED - Abnormal; Notable for the following:    Glucose-Capillary 222 (*)    All other components within normal limits  CBG MONITORING, ED - Abnormal; Notable for the following:    Glucose-Capillary 42 (*)    All other components within normal limits  CBG MONITORING, ED - Abnormal; Notable for the following:    Glucose-Capillary 56 (*)    All other components  within normal limits  CBG MONITORING, ED - Abnormal; Notable for the following:    Glucose-Capillary 177 (*)    All other components within normal limits  CBC WITH DIFFERENTIAL/PLATELET  LIPASE, BLOOD  ETHANOL  SALICYLATE LEVEL  URINE RAPID DRUG SCREEN (HOSP PERFORMED)  I-STAT CG4 LACTIC ACID, ED  I-STAT CG4 LACTIC ACID, ED    Suicide attempt    Blane OharaJoshua Jameshia Hayashida, MD 07/21/14 1539

## 2014-07-21 NOTE — ED Notes (Addendum)
Awake. Verbally responsive. A/O x4. Resp even and unlabored. No audible adventitious breath sounds noted. ABC's intact. Pt CBG at 56. Pt given OJ with packs of regular sugar. PA aware. Pt order meal tray for hypoglycemia episode.

## 2014-07-21 NOTE — ED Notes (Signed)
TTS to call with telepysch in room.

## 2014-07-21 NOTE — ED Notes (Signed)
Resting quietly with eye closed. Easily arousable. Verbally responsive. Resp even and unlabored. ABC's intact. No behavior problems noted. NAD noted. Sitter at bedside.  

## 2014-07-21 NOTE — ED Provider Notes (Signed)
CSN: [id number]     Arrival date & time 07/21/14  0505 History   First MD Initiated Contact with Patient 07/21/14 [phone]     Chief Complaint  Patient presents with  . Suicidal     (Consider location/radiation/quality/duration/timing/severity/associated sxs/prior Treatment) Patient is a 29 y.o. male presenting with mental health disorder. The history is provided by the patient and the EMS personnel.  Mental Health Problem Presenting symptoms: suicide attempt   Patient accompanied by:  Law enforcement Degree of incapacity (severity):  Severe Onset quality:  Sudden Timing:  Constant Progression:  Unchanged Chronicity:  Recurrent Context: noncompliance   Relieved by:  Nothing Worsened by:  Nothing tried Ineffective treatments:  None tried Associated symptoms: no abdominal pain and no weight change   Risk factors: hx of mental illness, hx of suicide attempts and recent psychiatric admission   Drank a small amount of chlorox bleach at 4 am in attempt to kill himself.  Has OD on multiple substances in the past  Past Medical History  Diagnosis Date  . Gallstones    Past Surgical History  Procedure Laterality Date  . Cholecystectomy     History reviewed. No pertinent family history. History  Substance Use Topics  . Smoking status: Current Some Day Smoker  . Smokeless tobacco: Never Used  . Alcohol Use: Yes    Review of Systems  Gastrointestinal: Negative for abdominal pain.  All other systems reviewed and are negative.     Allergies  Tramadol and Ibuprofen  Home Medications   Prior to Admission medications   Medication Sig Start Date End Date Taking? Authorizing Provider  insulin NPH-regular Human (NOVOLIN 70/30) (70-30) 100 UNIT/ML injection Inject 25 Units into the skin 2 (two) times daily with a meal.   Yes Historical Provider, MD   BP 130/76 mmHg  Pulse 89  Temp(Src) 98.2 F (36.8 C) (Oral)  Resp 18  SpO2 97% Physical Exam  Constitutional: He is oriented to  person, place, and time. He appears well-developed and well-nourished. No distress.  HENT:  Head: Normocephalic and atraumatic.  Mouth/Throat: Oropharynx is clear and moist.  Eyes: Conjunctivae are normal. Pupils are equal, round, and reactive to light.  Neck: Normal range of motion. Neck supple.  Cardiovascular: Normal rate, regular rhythm and intact distal pulses.   Pulmonary/Chest: Effort normal and breath sounds normal. No respiratory distress. He has no wheezes. He has no rales.  Abdominal: Soft. Bowel sounds are normal. There is no tenderness. There is no rebound and no guarding.  Musculoskeletal: Normal range of motion.  Neurological: He is alert and oriented to person, place, and time. He has normal reflexes.  Skin: Skin is warm and dry.  Psychiatric: He expresses suicidal ideation.    ED Course  Procedures (including critical care time) Labs Review Labs Reviewed  I-STAT CHEM 8, ED - Abnormal; Notable for the following:    Glucose, Bld 496 (*)    Calcium, Ion 1.28 (*)    All other components within normal limits  CBC WITH DIFFERENTIAL/PLATELET  LIPASE, BLOOD  PROTIME-INR  ETHANOL  ACETAMINOPHEN LEVEL  SALICYLATE LEVEL  URINE RAPID DRUG SCREEN (HOSP PERFORMED)  I-STAT CG4 LACTIC ACID, ED    Imaging Review Dg Chest Portable 1 View  07/21/2014   CLINICAL DATA:  Initial evaluation for suicidal ideation.  EXAM: PORTABLE CHEST - 1 VIEW  COMPARISON:  Prior study 09/27/2006  FINDINGS: The cardiac and mediastinal silhouettes are stable in size and contour, and remain within normal limits.  The lungs  are normally inflated. No airspace consolidation, pleural effusion, or pulmonary edema is identified. There is no pneumothorax.  No acute osseous abnormality identified.  IMPRESSION: No active cardiopulmonary disease.   Electronically Signed   By: Rise Mu M.D.   On: 07/21/2014 06:21     EKG Interpretation   Date/Time:  Sunday Lionardo Haze 24 2016 06:19:53 EDT Ventricular  Rate:  86 PR Interval:  143 QRS Duration: 105 QT Interval:  385 QTC Calculation: 460 R Axis:   58 Text Interpretation:  Sinus rhythm ST elev, probable normal early repol  pattern Confirmed by [facility]  MD, Maddilyn Campus ([id number]) on 07/21/2014 6:39:04  AM      MDM   Final diagnoses:  None  No signs of perforations on exam or xray.  Labs unremarkable.    Per [employer] report wife went to magistrate to commit patient.    Will need inpatient psychiatric therapy when cleared medically.  Will need a recheck of labs and reassessment at 9 am.    NPO until dinner and advance to clears.  IVF started.  Holding orders placed.        Cy Blamer, MD 07/21/14 [phone]

## 2014-07-21 NOTE — Consult Note (Addendum)
[facility] Consult   Reason for Consult:  ED Physician Referring Physician:  Depression, Suicide attempt by ingesting bleach Patient Identification: Raymond [name] MRN:  [id number] Principal Diagnosis: MDD  Diagnosis:  There are no active problems to display for this patient.   Total Time spent with patient: 30 minutes  Subjective:   Raymond [name] is a 29 y.o. male patient admitted with  Suicidal attempt- reportedly ingested bleach.  HPI:   Patient is a 29 year old married man, lives with wife and young child, employed .  States he has been feeling more depressed recently due to increasing marital conflict,discord. In the context of ongoing argument with wife he impulsively ingested bleach, after which a family member  Contacted EMS . Reports he has a history of depression, and had had prior  Psychiatric admissions for suicidal ideations. (+) history of overdose attempts and (+) history of self cutting but not in a long time. He also has a history of " cough medication" / dextromethorphan abuse, particularly during periods of increased marital stress. Last used 1-2 days ago. Medical History is remarkable for Type I DM.  At this time he states he remains depressed , reports low energy, sadness, some anhedonia, and states he feels he needs psychiatric admission. Denies any psychotic symptoms. He was not taking any psychiatric medications prior to his admission.     HPI Elements:  Long history of Depression, worsening recently in the context of marital discord, resulting in suicidal ideations and impulsive suicidal gesture by ingesting bleach.  Past Medical History:  Past Medical History  Diagnosis Date  . Gallstones     Past Surgical History  Procedure Laterality Date  . Cholecystectomy     Family History: History reviewed. No pertinent family history. Social History:  History  Alcohol Use  . Yes     History  Drug Use No    History   Social  History  . Marital Status: Single    Spouse Name: N/A  . Number of Children: N/A  . Years of Education: N/A   Social History Main Topics  . Smoking status: Current Some Day Smoker  . Smokeless tobacco: Never Used  . Alcohol Use: Yes  . Drug Use: No  . Sexual Activity: Not on file   Other Topics Concern  . None   Social History Narrative  . None   Additional Social History:    Pain Medications: none Prescriptions: none Over the Counter: Insulin (for diabetes) History of alcohol / drug use?: Yes Longest period of sobriety (when/how long): unk Negative Consequences of Use: Personal relationships Name of Substance 1: cough medicine 1 - Age of First Use: 22 1 - Amount (size/oz): 16- 61m tablets 1 - Frequency: daily- occasional "depending on level of stress" 1 - Duration: ongoing 1 - Last Use / Amount: 07/20/14                   Allergies:   Allergies  Allergen Reactions  . Tramadol Other (See Comments)    Hypoglycemia Shock  . Ibuprofen Swelling    Labs:  Results for orders placed or performed during the hospital encounter of 07/21/14 (from the past 48 hour(s))  CBC with Differential/Platelet     Status: None   Collection Time: 07/21/14  5:36 AM  Result Value Ref Range   WBC 7.6 4.0 - 10.5 K/uL   RBC 5.13 4.22 - 5.81 MIL/uL   Hemoglobin 15.0 13.0 - 17.0 g/dL  HCT 43.8 39.0 - 52.0 %   MCV 85.4 78.0 - 100.0 fL   MCH 29.2 26.0 - 34.0 pg   MCHC 34.2 30.0 - 36.0 g/dL   RDW 12.0 11.5 - 15.5 %   Platelets 364 150 - 400 K/uL   Neutrophils Relative % 52 43 - 77 %   Neutro Abs 3.9 1.7 - 7.7 K/uL   Lymphocytes Relative 41 12 - 46 %   Lymphs Abs 3.1 0.7 - 4.0 K/uL   Monocytes Relative 6 3 - 12 %   Monocytes Absolute 0.5 0.1 - 1.0 K/uL   Eosinophils Relative 1 0 - 5 %   Eosinophils Absolute 0.1 0.0 - 0.7 K/uL   Basophils Relative 0 0 - 1 %   Basophils Absolute 0.0 0.0 - 0.1 K/uL  Lipase, blood     Status: None   Collection Time: 07/21/14  5:36 AM  Result  Value Ref Range   Lipase 26 11 - 59 U/L  Protime-INR     Status: Abnormal   Collection Time: 07/21/14  5:36 AM  Result Value Ref Range   Prothrombin Time 11.3 (L) 11.6 - 15.2 seconds   INR 0.82 0.00 - 1.49  Ethanol     Status: None   Collection Time: 07/21/14  5:36 AM  Result Value Ref Range   Alcohol, Ethyl (B) <5 0 - 9 mg/dL    Comment:        LOWEST DETECTABLE LIMIT FOR SERUM ALCOHOL IS 11 mg/dL FOR MEDICAL PURPOSES ONLY   Acetaminophen level     Status: Abnormal   Collection Time: 07/21/14  5:36 AM  Result Value Ref Range   Acetaminophen (Tylenol), Serum <10.0 (L) 10 - 30 ug/mL    Comment:        THERAPEUTIC CONCENTRATIONS VARY SIGNIFICANTLY. A RANGE OF 10-30 ug/mL MAY BE AN EFFECTIVE CONCENTRATION FOR MANY PATIENTS. HOWEVER, SOME ARE BEST TREATED AT CONCENTRATIONS OUTSIDE THIS RANGE. ACETAMINOPHEN CONCENTRATIONS >150 ug/mL AT 4 HOURS AFTER INGESTION AND >50 ug/mL AT 12 HOURS AFTER INGESTION ARE OFTEN ASSOCIATED WITH TOXIC REACTIONS.   Salicylate level     Status: None   Collection Time: 07/21/14  5:36 AM  Result Value Ref Range   Salicylate Lvl <7.4 2.8 - 20.0 mg/dL  I-Stat Chem 8, ED     Status: Abnormal   Collection Time: 07/21/14  5:47 AM  Result Value Ref Range   Sodium 138 135 - 145 mmol/L   Potassium 3.9 3.5 - 5.1 mmol/L   Chloride 100 96 - 112 mmol/L   BUN 13 6 - 23 mg/dL   Creatinine, Ser 1.00 0.50 - 1.35 mg/dL   Glucose, Bld 496 (H) 70 - 99 mg/dL   Calcium, Ion 1.28 (H) 1.12 - 1.23 mmol/L   TCO2 24 0 - 100 mmol/L   Hemoglobin 15.6 13.0 - 17.0 g/dL   HCT 46.0 39.0 - 52.0 %  I-Stat CG4 Lactic Acid, ED     Status: None   Collection Time: 07/21/14  5:47 AM  Result Value Ref Range   Lactic Acid, Venous 1.75 0.5 - 2.0 mmol/L  Drug screen panel, emergency     Status: None   Collection Time: 07/21/14  6:45 AM  Result Value Ref Range   Opiates NONE DETECTED NONE DETECTED   Cocaine NONE DETECTED NONE DETECTED   Benzodiazepines NONE DETECTED NONE  DETECTED   Amphetamines NONE DETECTED NONE DETECTED   Tetrahydrocannabinol NONE DETECTED NONE DETECTED   Barbiturates NONE DETECTED NONE DETECTED  Comment:        DRUG SCREEN FOR MEDICAL PURPOSES ONLY.  IF CONFIRMATION IS NEEDED FOR ANY PURPOSE, NOTIFY LAB WITHIN 5 DAYS.        LOWEST DETECTABLE LIMITS FOR URINE DRUG SCREEN Drug Class       Cutoff (ng/mL) Amphetamine      1000 Barbiturate      200 Benzodiazepine   023 Tricyclics       343 Opiates          300 Cocaine          300 THC              50   POC CBG, ED     Status: Abnormal   Collection Time: 07/21/14  7:25 AM  Result Value Ref Range   Glucose-Capillary 222 (H) 70 - 99 mg/dL  I-Stat CG4 Lactic Acid, ED     Status: None   Collection Time: 07/21/14  8:57 AM  Result Value Ref Range   Lactic Acid, Venous 0.97 0.5 - 2.0 mmol/L  Acetaminophen level     Status: Abnormal   Collection Time: 07/21/14  9:44 AM  Result Value Ref Range   Acetaminophen (Tylenol), Serum <10.0 (L) 10 - 30 ug/mL    Comment:        THERAPEUTIC CONCENTRATIONS VARY SIGNIFICANTLY. A RANGE OF 10-30 ug/mL MAY BE AN EFFECTIVE CONCENTRATION FOR MANY PATIENTS. HOWEVER, SOME ARE BEST TREATED AT CONCENTRATIONS OUTSIDE THIS RANGE. ACETAMINOPHEN CONCENTRATIONS >150 ug/mL AT 4 HOURS AFTER INGESTION AND >50 ug/mL AT 12 HOURS AFTER INGESTION ARE OFTEN ASSOCIATED WITH TOXIC REACTIONS.   CBC with Differential/Platelet     Status: Abnormal   Collection Time: 07/21/14  9:47 AM  Result Value Ref Range   WBC 6.1 4.0 - 10.5 K/uL   RBC 4.20 (L) 4.22 - 5.81 MIL/uL   Hemoglobin 12.4 (L) 13.0 - 17.0 g/dL    Comment: RESULT REPEATED AND VERIFIED DELTA CHECK NOTED    HCT 36.1 (L) 39.0 - 52.0 %   MCV 86.0 78.0 - 100.0 fL   MCH 29.5 26.0 - 34.0 pg   MCHC 34.3 30.0 - 36.0 g/dL   RDW 12.0 11.5 - 15.5 %   Platelets 298 150 - 400 K/uL   Neutrophils Relative % 45 43 - 77 %   Neutro Abs 2.8 1.7 - 7.7 K/uL   Lymphocytes Relative 45 12 - 46 %   Lymphs Abs 2.8  0.7 - 4.0 K/uL   Monocytes Relative 8 3 - 12 %   Monocytes Absolute 0.5 0.1 - 1.0 K/uL   Eosinophils Relative 2 0 - 5 %   Eosinophils Absolute 0.1 0.0 - 0.7 K/uL   Basophils Relative 1 0 - 1 %   Basophils Absolute 0.0 0.0 - 0.1 K/uL  Comprehensive metabolic panel     Status: Abnormal   Collection Time: 07/21/14  9:47 AM  Result Value Ref Range   Sodium 141 135 - 145 mmol/L   Potassium 3.3 (L) 3.5 - 5.1 mmol/L   Chloride 110 96 - 112 mmol/L    Comment: DELTA CHECK NOTED   CO2 26 19 - 32 mmol/L   Glucose, Bld 154 (H) 70 - 99 mg/dL   BUN 11 6 - 23 mg/dL   Creatinine, Ser 0.78 0.50 - 1.35 mg/dL   Calcium 8.0 (L) 8.4 - 10.5 mg/dL   Total Protein 5.3 (L) 6.0 - 8.3 g/dL   Albumin 3.0 (L) 3.5 - 5.2 g/dL   AST  20 0 - 37 U/L   ALT 22 0 - 53 U/L   Alkaline Phosphatase 100 39 - 117 U/L   Total Bilirubin 0.3 0.3 - 1.2 mg/dL   GFR calc non Af Amer >90 >90 mL/min   GFR calc Af Amer >90 >90 mL/min    Comment: (NOTE) The eGFR has been calculated using the CKD EPI equation. This calculation has not been validated in all clinical situations. eGFR's persistently <90 mL/min signify possible Chronic Kidney Disease.    Anion gap 5 5 - 15  CBG monitoring, ED     Status: Abnormal   Collection Time: 07/21/14 12:18 PM  Result Value Ref Range   Glucose-Capillary 42 (LL) 70 - 99 mg/dL    Vitals: Blood pressure 169/96, pulse 73, temperature 97.8 F (36.6 C), temperature source Oral, resp. rate 20, SpO2 98 %.  Risk to Self: Suicidal Ideation: Yes-Currently Present Suicidal Intent: Yes-Currently Present Is patient at risk for suicide?: Yes Suicidal Plan?: Yes-Currently Present Specify Current Suicidal Plan: drinking bleach Access to Means: Yes Specify Access to Suicidal Means: bleach in the home What has been your use of drugs/alcohol within the last 12 months?: cough meds How many times?:  (multiple) Other Self Harm Risks: unk Triggers for Past Attempts: Spouse contact Intentional Self  Injurious Behavior: None Risk to Others: Homicidal Ideation: No Thoughts of Harm to Others: No Current Homicidal Intent: No Current Homicidal Plan: No Access to Homicidal Means: No Identified Victim: NA History of harm to others?: No Assessment of Violence: None Noted Violent Behavior Description: Patient calm and cooperative during assessment.  Does patient have access to weapons?: No Criminal Charges Pending?: No Does patient have a court date: No Prior Inpatient Therapy: Prior Inpatient Therapy: Yes Prior Therapy Dates: most recent 09/2013 Prior Therapy Facilty/Provider(s): [facility] Reason for Treatment: suicidal attempt Prior Outpatient Therapy: Prior Outpatient Therapy: Yes Prior Therapy Dates: 2009 Prior Therapy Facilty/Provider(s): [facility]- Levada Dy Reason for Treatment: BPD  Current Facility-Administered Medications  Medication Dose Route Frequency Provider Last Rate Last Dose  . alum & mag hydroxide-simeth (MAALOX/MYLANTA) 200-200-20 MG/5ML suspension 30 mL  30 mL Oral PRN April Palumbo, MD      . ondansetron [facility]) tablet 4 mg  4 mg Oral Q8H PRN April Palumbo, MD       Current Outpatient Prescriptions  Medication Sig Dispense Refill  . insulin NPH-regular Human (NOVOLIN 70/30) (70-30) 100 UNIT/ML injection Inject 25 Units into the skin 2 (two) times daily with a meal.      Musculoskeletal: Strength & Muscle Tone: within normal limits Gait & Station: normal Patient leans: N/A  Psychiatric Specialty Exam: Physical Exam  ROS  Blood pressure 169/96, pulse 73, temperature 97.8 F (36.6 C), temperature source Oral, resp. rate 20, SpO2 98 %.There is no height or weight on file to calculate BMI.  General Appearance: Fairly Groomed- multiple tattoos visible on hands , face   Eye Contact::  Good  Speech:  Normal Rate  Volume:  Normal  Mood:  Depressed  Affect:  Constricted  Thought Process:  Goal Directed and Linear  Orientation:  Full (Time, Place,  and Person)  Thought Content:  denies hallucinations, no delusions, ruminative about relationship issues   Suicidal Thoughts:  Yes.  without intent/plan- at this time denies any ongoing plan or intention of hurting self and contracts for safety on the unit. Denies any homicidal or violent ideations towards wife .  Homicidal Thoughts:  No  Memory:  recent and remote grossly intact  Judgement:  Good  Insight:  Good  Psychomotor Activity:  Decreased  Concentration:  Good  Recall:  Good  Fund of Knowledge:Good  Language: Good  Akathisia:  Negative  Handed:  Right  AIMS (if indicated):     Assets:  Desire for Improvement Resilience Vocational/Educational  ADL's:  Fair   Cognition: WNL  Sleep:      Medical Decision Making: Review of Psycho-Social Stressors (1), Review or order clinical lab tests (1), Established Problem, Worsening (2) and Review of Medication Regimen & Side Effects (2)  Treatment Plan Summary: See below   Plan:  Recommend psychiatric Inpatient admission when medically cleared. Patient is in agreement and states he is willing to sign self in voluntarily  Patient in ED and at this time being medically cleared. Labs remarkable for hypokalemia  And hypoglycemia. Patient encouraged to consider antidepressant therapy once further medically cleared   Jamear Carbonneau, [facility] 07/21/2014 12:32 PM

## 2014-07-21 NOTE — ED Notes (Signed)
Resting quietly with eye closed. Easily arousable. Verbally responsive. Resp even and unlabored. ABC's intact. No behavior problems noted. IV infusing NS at 9399ml/hr x2 liters. NAD noted. Family at bedside.

## 2014-07-21 NOTE — ED Notes (Signed)
Patient appears calm, cooperative. Denies SI, HI, AVH at present. Reports feelings of anxiety 3/10, depression 5/10. States that he has been sleeping too much in recent weeks. Reports fluctuations in appetite in recent weeks.  Encouragement offered. Oriented to the unit.  Q 15 safety checks in place.

## 2014-07-21 NOTE — ED Notes (Signed)
Resting quietly with eye closed. Easily arousable. Verbally responsive. Resp even and unlabored. ABC's intact. No behavior problems noted. IV saline lock patent and intact. No reported of n/v at this time. Family at bedside.

## 2014-07-21 NOTE — ED Notes (Signed)
Resting quietly with eye closed. Easily arousable. Verbally responsive. Resp even and unlabored. ABC's intact. No behavior problems noted. CBG increased to 177. NAD noted. Sitter at bedside.

## 2014-07-21 NOTE — ED Notes (Signed)
Resting quietly with eye closed. Easily arousable. Verbally responsive. Resp even and unlabored. ABC's intact. No behavior problems noted. NAD noted.  

## 2014-07-21 NOTE — ED Notes (Signed)
Resting quietly with eye closed. Easily arousable. Verbally responsive. Resp even and unlabored. ABC's intact. No behavior problems noted. IV saline lock patent and intact. NAD noted. Family at bedside.

## 2014-07-21 NOTE — ED Notes (Signed)
Bed: WA09 Expected date:  Expected time:  Means of arrival:  Comments: EMS 29 yo M, SI / drank bleach

## 2014-07-21 NOTE — ED Notes (Signed)
Resting quietly with eye closed. Easily arousable. Verbally responsive. Resp even and unlabored. ABC's intact. No behavior problems noted. NAD noted. IV saline lock patent and intact. Family at bedside.

## 2014-07-21 NOTE — ED Notes (Signed)
Contact Poison Controlled and d/t condition stable pt cleared of their services at this time.

## 2014-07-21 NOTE — ED Notes (Signed)
Brought in by EMS from home  after pt's suicide attempt.  Pt's brother called EMS and reported that pt wanted to kill self and drank "Chlorox bleach"--- pt drank an unknown amount of the bleach solution.  Pt reported that he just "took a few sips".  Pt arrived to ED A/Ox4, denies abdominal pain, denies nausea or vomiting.

## 2014-07-21 NOTE — ED Notes (Signed)
Provided pt with OJ

## 2014-07-21 NOTE — Progress Notes (Signed)
Clinician completed TTS assessment.   Nira Retortelilah Shamel Galyean, MSW, LCSW Triage Specialist [phone],

## 2014-07-21 NOTE — ED Notes (Signed)
Resting quietly with eye closed. Easily arousable. Verbally responsive. Resp even and unlabored. ABC's intact. No behavior problems noted. NAD noted. IV saline lock patent and intact. Family at bedside. 

## 2014-07-21 NOTE — ED Notes (Addendum)
Resting quietly with eye closed. Easily arousable. Verbally responsive. Resp even and unlabored. ABC's intact. No behavior problems noted. NAD noted. Sitter at bedside.  

## 2014-07-22 ENCOUNTER — Inpatient Hospital Stay (HOSPITAL_COMMUNITY)
Admission: AD | Admit: 2014-07-22 | Discharge: 2014-07-28 | DRG: 885 | Disposition: A | Discharge status: Home / Self Care | Payer: Federal, State, Local not specified - Other | Source: Intra-hospital | Attending: Psychiatry | Admitting: Psychiatry

## 2014-07-22 ENCOUNTER — Encounter (HOSPITAL_COMMUNITY): Payer: Self-pay | Admitting: *Deleted

## 2014-07-22 DIAGNOSIS — F1910 Other psychoactive substance abuse, uncomplicated: Secondary | ICD-10-CM | POA: Diagnosis not present

## 2014-07-22 DIAGNOSIS — G4700 Insomnia, unspecified: Secondary | ICD-10-CM | POA: Diagnosis present

## 2014-07-22 DIAGNOSIS — E119 Type 2 diabetes mellitus without complications: Secondary | ICD-10-CM | POA: Diagnosis present

## 2014-07-22 DIAGNOSIS — T1491XA Suicide attempt, initial encounter: Secondary | ICD-10-CM | POA: Insufficient documentation

## 2014-07-22 DIAGNOSIS — G4710 Hypersomnia, unspecified: Secondary | ICD-10-CM | POA: Diagnosis present

## 2014-07-22 DIAGNOSIS — F419 Anxiety disorder, unspecified: Secondary | ICD-10-CM | POA: Diagnosis present

## 2014-07-22 DIAGNOSIS — T1491 Suicide attempt: Secondary | ICD-10-CM | POA: Diagnosis not present

## 2014-07-22 DIAGNOSIS — IMO0002 Reserved for concepts with insufficient information to code with codable children: Secondary | ICD-10-CM | POA: Diagnosis present

## 2014-07-22 DIAGNOSIS — F332 Major depressive disorder, recurrent severe without psychotic features: Secondary | ICD-10-CM | POA: Diagnosis present

## 2014-07-22 DIAGNOSIS — T5492XA Toxic effect of unspecified corrosive substance, intentional self-harm, initial encounter: Secondary | ICD-10-CM | POA: Diagnosis not present

## 2014-07-22 HISTORY — DX: Type 2 diabetes mellitus without complications: E11.9

## 2014-07-22 LAB — GLUCOSE, CAPILLARY
Glucose-Capillary: 246 mg/dL — ABNORMAL HIGH (ref 70–99)
Glucose-Capillary: 145 mg/dL — ABNORMAL HIGH (ref 70–99)

## 2014-07-22 LAB — CBG MONITORING, ED
Glucose-Capillary: 288 mg/dL — ABNORMAL HIGH (ref 70–99)
Glucose-Capillary: 131 mg/dL — ABNORMAL HIGH (ref 70–99)

## 2014-07-22 MED ORDER — INSULIN ASPART 100 UNIT/ML ~~LOC~~ SOLN
0.0000 [IU] | Freq: Three times a day (TID) | SUBCUTANEOUS | Status: DC
Start: 2014-07-22 — End: 2014-07-22
  Administered 2014-07-22: 5 [IU] via SUBCUTANEOUS
  Administered 2014-07-22: 1 [IU] via SUBCUTANEOUS
  Filled 2014-07-22: qty 1

## 2014-07-22 MED ORDER — INSULIN ASPART PROT & ASPART (70-30 MIX) 100 UNIT/ML ~~LOC~~ SUSP
25.0000 [IU] | Freq: Two times a day (BID) | SUBCUTANEOUS | Status: DC
  Administered 2014-07-22 – 2014-07-28 (×12): 25 [IU] via SUBCUTANEOUS

## 2014-07-22 MED ORDER — TRAZODONE HCL 50 MG PO TABS: 50.0000 mg | ORAL_TABLET | Freq: Every day | ORAL | Status: DC

## 2014-07-22 MED ORDER — INSULIN ASPART PROT & ASPART (70-30 MIX) 100 UNIT/ML ~~LOC~~ SUSP
25.0000 [IU] | Freq: Two times a day (BID) | SUBCUTANEOUS | Status: DC
  Administered 2014-07-22: 25 [IU] via SUBCUTANEOUS
  Filled 2014-07-22: qty 10

## 2014-07-22 MED ORDER — ALUM & MAG HYDROXIDE-SIMETH 200-200-20 MG/5ML PO SUSP
30.0000 mL | ORAL | Status: DC | PRN
  Administered 2014-07-24: 30 mL via ORAL
  Filled 2014-07-22: qty 30

## 2014-07-22 MED ORDER — ONDANSETRON HCL 4 MG PO TABS: 4.0000 mg | ORAL_TABLET | Freq: Three times a day (TID) | ORAL | Status: DC | PRN

## 2014-07-22 MED ORDER — TRAZODONE HCL 50 MG PO TABS
50.0000 mg | ORAL_TABLET | Freq: Every day | ORAL | Status: DC
  Administered 2014-07-22 – 2014-07-23 (×2): 50 mg via ORAL
  Filled 2014-07-22 (×4): qty 1

## 2014-07-22 MED ORDER — INSULIN ASPART 100 UNIT/ML ~~LOC~~ SOLN
0.0000 [IU] | Freq: Three times a day (TID) | SUBCUTANEOUS | Status: DC
  Administered 2014-07-22: 3 [IU] via SUBCUTANEOUS
  Administered 2014-07-23: 7 [IU] via SUBCUTANEOUS
  Administered 2014-07-23 – 2014-07-24 (×3): 5 [IU] via SUBCUTANEOUS
  Administered 2014-07-24: 2 [IU] via SUBCUTANEOUS
  Administered 2014-07-25: 1 [IU] via SUBCUTANEOUS
  Administered 2014-07-25: 7 [IU] via SUBCUTANEOUS
  Administered 2014-07-25: 9 [IU] via SUBCUTANEOUS
  Administered 2014-07-26: 2 [IU] via SUBCUTANEOUS
  Administered 2014-07-26: 3 [IU] via SUBCUTANEOUS
  Administered 2014-07-26: 2 [IU] via SUBCUTANEOUS
  Administered 2014-07-27: 3 [IU] via SUBCUTANEOUS
  Administered 2014-07-27: 5 [IU] via SUBCUTANEOUS
  Administered 2014-07-27: 2 [IU] via SUBCUTANEOUS
  Administered 2014-07-28 (×2): 1 [IU] via SUBCUTANEOUS

## 2014-07-22 NOTE — Consult Note (Signed)
[facility] Psychiatry Consult   Reason for Consult:  ED Physician Referring Physician:  Depression, Suicide attempt by ingesting bleach Patient Identification: Raymond [name] MRN:  [id number] Principal Diagnosis: MDD  Diagnosis:  There are no active problems to display for this patient.   Total Time spent with patient: 30 minutes  Subjective:   Raymond [name] is a 29 y.o. male patient admitted with  Suicidal attempt- reportedly ingested bleach.  HPI:   Patient is a 29 year old married man, lives with wife and young child, employed .  States he has been feeling more depressed recently due to increasing marital conflict,discord. In the context of ongoing argument with wife he impulsively ingested bleach, after which a family member  Contacted EMS . Reports he has a history of depression, and had had prior  Psychiatric admissions for suicidal ideations. (+) history of overdose attempts and (+) history of self cutting but not in a long time. He also has a history of " cough medication" / dextromethorphan abuse, particularly during periods of increased marital stress. Last used 1-2 days ago. Medical History is remarkable for Type I DM.  At this time he states he remains depressed , reports low energy, sadness, some anhedonia, and states he feels he needs psychiatric admission. Denies any psychotic symptoms. He was not taking any psychiatric medications prior to his admission.  07/21/14:  Patient was arguing with providers this morning because he was informed that his actions warranted suicidal diagnosis.  Patient stated that his taking the cough syrup and Bleach was "just to relieve my anger" and not suicide.  He also admitted to multiple ingestion of different house hold liquids and chemicals when ever there is an argument with his wife.  We  Have accepted patient for admission and we will be treating his Borderline personality disorder with counseling and group therapy.  We will also  encourage patient to consider antidepressant for his mood.  He denies SI/HI/AVH.  He has an assigned room and will be moving to his room as soon as transportation is here.   HPI Elements:  Long history of Depression, worsening recently in the context of marital discord, resulting in suicidal ideations and impulsive suicidal gesture by ingesting bleach.  Past Medical History:  Past Medical History  Diagnosis Date  . Gallstones     Past Surgical History  Procedure Laterality Date  . Cholecystectomy     Family History: History reviewed. No pertinent family history. Social History:  History  Alcohol Use  . Yes     History  Drug Use No    History   Social History  . Marital Status: Single    Spouse Name: N/A  . Number of Children: N/A  . Years of Education: N/A   Social History Main Topics  . Smoking status: Current Some Day Smoker  . Smokeless tobacco: Never Used  . Alcohol Use: Yes  . Drug Use: No  . Sexual Activity: Not on file   Other Topics Concern  . None   Social History Narrative  . None   Additional Social History:    Pain Medications: none Prescriptions: none Over the Counter: Insulin (for diabetes) History of alcohol / drug use?: Yes Longest period of sobriety (when/how long): unk Negative Consequences of Use: Personal relationships Name of Substance 1: cough medicine 1 - Age of First Use: 22 1 - Amount (size/oz): 16- $RemoveBefore'30mg'KREvXgUxnaxtL$  tablets 1 - Frequency: daily- occasional "depending on level of stress" 1 - Duration: ongoing  1 - Last Use / Amount: 07/20/14                   Allergies:   Allergies  Allergen Reactions  . Tramadol Other (See Comments)    Hypoglycemia Shock  . Ibuprofen Swelling    Labs:  Results for orders placed or performed during the hospital encounter of 07/21/14 (from the past 48 hour(s))  CBC with Differential/Platelet     Status: None   Collection Time: 07/21/14  5:36 AM  Result Value Ref Range   WBC 7.6 4.0 - 10.5 K/uL    RBC 5.13 4.22 - 5.81 MIL/uL   Hemoglobin 15.0 13.0 - 17.0 g/dL   HCT 43.8 39.0 - 52.0 %   MCV 85.4 78.0 - 100.0 fL   MCH 29.2 26.0 - 34.0 pg   MCHC 34.2 30.0 - 36.0 g/dL   RDW 12.0 11.5 - 15.5 %   Platelets 364 150 - 400 K/uL   Neutrophils Relative % 52 43 - 77 %   Neutro Abs 3.9 1.7 - 7.7 K/uL   Lymphocytes Relative 41 12 - 46 %   Lymphs Abs 3.1 0.7 - 4.0 K/uL   Monocytes Relative 6 3 - 12 %   Monocytes Absolute 0.5 0.1 - 1.0 K/uL   Eosinophils Relative 1 0 - 5 %   Eosinophils Absolute 0.1 0.0 - 0.7 K/uL   Basophils Relative 0 0 - 1 %   Basophils Absolute 0.0 0.0 - 0.1 K/uL  Lipase, blood     Status: None   Collection Time: 07/21/14  5:36 AM  Result Value Ref Range   Lipase 26 11 - 59 U/L  Protime-INR     Status: Abnormal   Collection Time: 07/21/14  5:36 AM  Result Value Ref Range   Prothrombin Time 11.3 (L) 11.6 - 15.2 seconds   INR 0.82 0.00 - 1.49  Ethanol     Status: None   Collection Time: 07/21/14  5:36 AM  Result Value Ref Range   Alcohol, Ethyl (B) <5 0 - 9 mg/dL    Comment:        LOWEST DETECTABLE LIMIT FOR SERUM ALCOHOL IS 11 mg/dL FOR MEDICAL PURPOSES ONLY   Acetaminophen level     Status: Abnormal   Collection Time: 07/21/14  5:36 AM  Result Value Ref Range   Acetaminophen (Tylenol), Serum <10.0 (L) 10 - 30 ug/mL    Comment:        THERAPEUTIC CONCENTRATIONS VARY SIGNIFICANTLY. A RANGE OF 10-30 ug/mL MAY BE AN EFFECTIVE CONCENTRATION FOR MANY PATIENTS. HOWEVER, SOME ARE BEST TREATED AT CONCENTRATIONS OUTSIDE THIS RANGE. ACETAMINOPHEN CONCENTRATIONS >150 ug/mL AT 4 HOURS AFTER INGESTION AND >50 ug/mL AT 12 HOURS AFTER INGESTION ARE OFTEN ASSOCIATED WITH TOXIC REACTIONS.   Salicylate level     Status: None   Collection Time: 07/21/14  5:36 AM  Result Value Ref Range   Salicylate Lvl <9.5 2.8 - 20.0 mg/dL  I-Stat Chem 8, ED     Status: Abnormal   Collection Time: 07/21/14  5:47 AM  Result Value Ref Range   Sodium 138 135 - 145 mmol/L    Potassium 3.9 3.5 - 5.1 mmol/L   Chloride 100 96 - 112 mmol/L   BUN 13 6 - 23 mg/dL   Creatinine, Ser 1.00 0.50 - 1.35 mg/dL   Glucose, Bld 496 (H) 70 - 99 mg/dL   Calcium, Ion 1.28 (H) 1.12 - 1.23 mmol/L   TCO2 24 0 - 100 mmol/L  Hemoglobin 15.6 13.0 - 17.0 g/dL   HCT 46.0 39.0 - 52.0 %  I-Stat CG4 Lactic Acid, ED     Status: None   Collection Time: 07/21/14  5:47 AM  Result Value Ref Range   Lactic Acid, Venous 1.75 0.5 - 2.0 mmol/L  Drug screen panel, emergency     Status: None   Collection Time: 07/21/14  6:45 AM  Result Value Ref Range   Opiates NONE DETECTED NONE DETECTED   Cocaine NONE DETECTED NONE DETECTED   Benzodiazepines NONE DETECTED NONE DETECTED   Amphetamines NONE DETECTED NONE DETECTED   Tetrahydrocannabinol NONE DETECTED NONE DETECTED   Barbiturates NONE DETECTED NONE DETECTED    Comment:        DRUG SCREEN FOR MEDICAL PURPOSES ONLY.  IF CONFIRMATION IS NEEDED FOR ANY PURPOSE, NOTIFY LAB WITHIN 5 DAYS.        LOWEST DETECTABLE LIMITS FOR URINE DRUG SCREEN Drug Class       Cutoff (ng/mL) Amphetamine      1000 Barbiturate      200 Benzodiazepine   789 Tricyclics       381 Opiates          300 Cocaine          300 THC              50   POC CBG, ED     Status: Abnormal   Collection Time: 07/21/14  7:25 AM  Result Value Ref Range   Glucose-Capillary 222 (H) 70 - 99 mg/dL  I-Stat CG4 Lactic Acid, ED     Status: None   Collection Time: 07/21/14  8:57 AM  Result Value Ref Range   Lactic Acid, Venous 0.97 0.5 - 2.0 mmol/L  Acetaminophen level     Status: Abnormal   Collection Time: 07/21/14  9:44 AM  Result Value Ref Range   Acetaminophen (Tylenol), Serum <10.0 (L) 10 - 30 ug/mL    Comment:        THERAPEUTIC CONCENTRATIONS VARY SIGNIFICANTLY. A RANGE OF 10-30 ug/mL MAY BE AN EFFECTIVE CONCENTRATION FOR MANY PATIENTS. HOWEVER, SOME ARE BEST TREATED AT CONCENTRATIONS OUTSIDE THIS RANGE. ACETAMINOPHEN CONCENTRATIONS >150 ug/mL AT 4 HOURS  AFTER INGESTION AND >50 ug/mL AT 12 HOURS AFTER INGESTION ARE OFTEN ASSOCIATED WITH TOXIC REACTIONS.   CBC with Differential/Platelet     Status: Abnormal   Collection Time: 07/21/14  9:47 AM  Result Value Ref Range   WBC 6.1 4.0 - 10.5 K/uL   RBC 4.20 (L) 4.22 - 5.81 MIL/uL   Hemoglobin 12.4 (L) 13.0 - 17.0 g/dL    Comment: RESULT REPEATED AND VERIFIED DELTA CHECK NOTED    HCT 36.1 (L) 39.0 - 52.0 %   MCV 86.0 78.0 - 100.0 fL   MCH 29.5 26.0 - 34.0 pg   MCHC 34.3 30.0 - 36.0 g/dL   RDW 12.0 11.5 - 15.5 %   Platelets 298 150 - 400 K/uL   Neutrophils Relative % 45 43 - 77 %   Neutro Abs 2.8 1.7 - 7.7 K/uL   Lymphocytes Relative 45 12 - 46 %   Lymphs Abs 2.8 0.7 - 4.0 K/uL   Monocytes Relative 8 3 - 12 %   Monocytes Absolute 0.5 0.1 - 1.0 K/uL   Eosinophils Relative 2 0 - 5 %   Eosinophils Absolute 0.1 0.0 - 0.7 K/uL   Basophils Relative 1 0 - 1 %   Basophils Absolute 0.0 0.0 - 0.1 K/uL  Comprehensive metabolic  panel     Status: Abnormal   Collection Time: 07/21/14  9:47 AM  Result Value Ref Range   Sodium 141 135 - 145 mmol/L   Potassium 3.3 (L) 3.5 - 5.1 mmol/L   Chloride 110 96 - 112 mmol/L    Comment: DELTA CHECK NOTED   CO2 26 19 - 32 mmol/L   Glucose, Bld 154 (H) 70 - 99 mg/dL   BUN 11 6 - 23 mg/dL   Creatinine, Ser 0.78 0.50 - 1.35 mg/dL   Calcium 8.0 (L) 8.4 - 10.5 mg/dL   Total Protein 5.3 (L) 6.0 - 8.3 g/dL   Albumin 3.0 (L) 3.5 - 5.2 g/dL   AST 20 0 - 37 U/L   ALT 22 0 - 53 U/L   Alkaline Phosphatase 100 39 - 117 U/L   Total Bilirubin 0.3 0.3 - 1.2 mg/dL   GFR calc non Af Amer >90 >90 mL/min   GFR calc Af Amer >90 >90 mL/min    Comment: (NOTE) The eGFR has been calculated using the CKD EPI equation. This calculation has not been validated in all clinical situations. eGFR's persistently <90 mL/min signify possible Chronic Kidney Disease.    Anion gap 5 5 - 15  CBG monitoring, ED     Status: Abnormal   Collection Time: 07/21/14 12:18 PM  Result Value  Ref Range   Glucose-Capillary 42 (LL) 70 - 99 mg/dL  CBG monitoring, ED     Status: Abnormal   Collection Time: 07/21/14  1:47 PM  Result Value Ref Range   Glucose-Capillary 56 (L) 70 - 99 mg/dL  CBC     Status: Abnormal   Collection Time: 07/21/14  2:16 PM  Result Value Ref Range   WBC 6.5 4.0 - 10.5 K/uL   RBC 4.40 4.22 - 5.81 MIL/uL   Hemoglobin 12.8 (L) 13.0 - 17.0 g/dL   HCT 38.0 (L) 39.0 - 52.0 %   MCV 86.4 78.0 - 100.0 fL   MCH 29.1 26.0 - 34.0 pg   MCHC 33.7 30.0 - 36.0 g/dL   RDW 12.1 11.5 - 15.5 %   Platelets 313 150 - 400 K/uL  CBG monitoring, ED     Status: Abnormal   Collection Time: 07/21/14  2:41 PM  Result Value Ref Range   Glucose-Capillary 177 (H) 70 - 99 mg/dL  CBG monitoring, ED     Status: Abnormal   Collection Time: 07/21/14  6:45 PM  Result Value Ref Range   Glucose-Capillary 245 (H) 70 - 99 mg/dL  CBG monitoring, ED     Status: Abnormal   Collection Time: 07/22/14  7:54 AM  Result Value Ref Range   Glucose-Capillary 288 (H) 70 - 99 mg/dL    Vitals: Blood pressure 148/84, pulse 73, temperature 97.8 F (36.6 C), temperature source Oral, resp. rate 18, SpO2 100 %.  Risk to Self: Suicidal Ideation: Yes-Currently Present Suicidal Intent: Yes-Currently Present Is patient at risk for suicide?: Yes Suicidal Plan?: Yes-Currently Present Specify Current Suicidal Plan: drinking bleach Access to Means: Yes Specify Access to Suicidal Means: bleach in the home What has been your use of drugs/alcohol within the last 12 months?: cough meds How many times?:  (multiple) Other Self Harm Risks: unk Triggers for Past Attempts: Spouse contact Intentional Self Injurious Behavior: None Risk to Others: Homicidal Ideation: No Thoughts of Harm to Others: No Current Homicidal Intent: No Current Homicidal Plan: No Access to Homicidal Means: No Identified Victim: NA History of harm to others?: No  Assessment of Violence: None Noted Violent Behavior Description: Patient  calm and cooperative during assessment.  Does patient have access to weapons?: No Criminal Charges Pending?: No Does patient have a court date: No Prior Inpatient Therapy: Prior Inpatient Therapy: Yes Prior Therapy Dates: most recent 09/2013 Prior Therapy Facilty/Provider(s): [facility] Reason for Treatment: suicidal attempt Prior Outpatient Therapy: Prior Outpatient Therapy: Yes Prior Therapy Dates: 2009 Prior Therapy Facilty/Provider(s): [facility]- [facility] Reason for Treatment: BPD  Current Facility-Administered Medications  Medication Dose Route Frequency Provider Last Rate Last Dose  . alum & mag hydroxide-simeth (MAALOX/MYLANTA) 200-200-20 MG/5ML suspension 30 mL  30 mL Oral PRN April Palumbo, MD      . insulin aspart (novoLOG) injection 0-9 Units  0-9 Units Subcutaneous TID WC Dorie Rank, MD   5 Units at 07/22/14 [phone]  . insulin aspart protamine- aspart (NOVOLOG MIX 70/30) injection 25 Units  25 Units Subcutaneous BID WC Dorie Rank, MD   25 Units at 07/22/14 [phone]  . ondansetron (ZOFRAN) tablet 4 mg  4 mg Oral Q8H PRN April Palumbo, MD      . traZODone (DESYREL) tablet 50 mg  50 mg Oral QHS Kaedance Magos       Current Outpatient Prescriptions  Medication Sig Dispense Refill  . insulin NPH-regular Human (NOVOLIN 70/30) (70-30) 100 UNIT/ML injection Inject 25 Units into the skin 2 (two) times daily with a meal.      Musculoskeletal: Strength & Muscle Tone: within normal limits Gait & Station: normal Patient leans: N/A  Psychiatric Specialty Exam: Physical Exam  ROS  Blood pressure 148/84, pulse 73, temperature 97.8 F (36.6 C), temperature source Oral, resp. rate 18, SpO2 100 %.There is no height or weight on file to calculate BMI.  General Appearance: Fairly Groomed- multiple tattoos visible on hands , face   Eye Contact::  Good  Speech:  Normal Rate  Volume:  Normal  Mood:  Depressed  Affect:  Constricted  Thought Process:  Goal Directed and Linear   Orientation:  Full (Time, Place, and Person)  Thought Content:  denies hallucinations, no delusions, ruminative about relationship issues   Suicidal Thoughts:  No-.  Homicidal Thoughts:  No  Memory:  recent and remote grossly intact   Judgement:  Good  Insight:  Good  Psychomotor Activity:  Decreased  Concentration:  Good  Recall:  Good  Fund of Knowledge:Good  Language: Good  Akathisia:  Negative  Handed:  Right  AIMS (if indicated):     Assets:  Desire for Improvement Resilience Vocational/Educational  ADL's:  Fair   Cognition: WNL  Sleep:      Medical Decision Making: Review of Psycho-Social Stressors (1), Review or order clinical lab tests (1), Established Problem, Worsening (2) and Review of Medication Regimen & Side Effects (2)  Treatment Plan Summary: See below   Plan:  Recommend psychiatric Inpatient admission when medically cleared. Patient is in agreement and states he is willing to sign self in voluntarily.  He is reluctant on starting antidepressant.  He has agreed to engage in counseling and group discussion for his Borderline personality disorder.   Delfin Gant   PMHNP-BC 07/22/2014 11:51 AM Patient seen face-to-face for psychiatric evaluation, chart reviewed and case discussed with the physician extender and developed treatment plan. Reviewed the information documented and agree with the treatment plan. Corena Pilgrim, MD

## 2014-07-22 NOTE — Progress Notes (Signed)
D: Pt observed talking and watching tv with the other patients in the dayroom. Pt verbalizes to be adjusting well to the unit. Pt present for group this evening. Pt is negative for any SI/HI/AVH. Pt is cooperative with his current POC.  A: Writer administered scheduled medications to pt, per MD orders. Continued support and avialabity as needed was extended to this pt. Staff continue to monitor pt with q4415min checks.  R: No adverse drug reactions noted. Pt receptive to treatment. Pt remains safe at this time.

## 2014-07-22 NOTE — ED Provider Notes (Signed)
Pt has history of diabetes.  Blood sugar elevated this am.  Pt's home insulin had not been ordered.  Pharmacy notes indicate pt takes 25-50 units BID.  Will start with 25 units bid.  Continue to monitor blood sugar.  Raymond [name], MLinwood Dibbles 07/22/14 [phone]

## 2014-07-22 NOTE — [facility] Counselor (Signed)
[facility] per Berneice Heinrichina Tate ,[facility] Dub MikesLugo accepting. Report [phone].  Raymond PlummerKristin Vonya [name], M.S., LPCA, [place]ASA, [facility] Licensed Professional Counselor Associate  Triage Specialist  [facility]  Therapeutic Triage Services Phone: [phone] Fax: [phone]

## 2014-07-22 NOTE — ED Notes (Signed)
Patient transferred to [facility].  Left the unit ambulatory with [facility] [employer].  All belongings given to the driver.  

## 2014-07-22 NOTE — Progress Notes (Signed)
[facility]  Pt has self-reported history of abusing cough medications.  He and his wife were arguing and pt stated,"I drank bleach I was feeling so frustrated"  Pt would not elaborate on the amount taken. He lives with his wife and has one daughter.  He denies any S/I or H/I and denies any A/V/H. Past medical history IDDM takes 70/30 25 units at breakfast and 25 units at dinner. He was given the opportunity to get numbers from cell phone but denied he needed any numbers. We did discuss how we don't go back into lockers and he did voice understanding. He is wanting help with his "frustration and anxiety"  He was oriented to the unit.

## 2014-07-22 NOTE — BH Assessment (Signed)
[facility] Assessment Progress Note  Per Thedore MinsMojeed Akintayo, MD, this pt requires psychiatric hospitalization at this time.  Berneice Heinrichina Tate, RN, [facility] Rm [facility].  Pt has signed Voluntary Admission and Consent for Treatment, as well as Consent to Release Information, and signed forms [facility].  Pt's nurse, Rudean HittDawnaly, has been notified, and agrees to send original paperwork along with pt via [place] [place], and to call report to [phone].  Doylene Canninghomas Janeil Schexnayder, MA Triage Specialist [phone]

## 2014-07-22 NOTE — Tx Team (Signed)
Initial Interdisciplinary Treatment Plan   PATIENT STRESSORS: Marital or family conflict Substance abuse   PATIENT STRENGTHS: Ability for insight Average or above average intelligence General fund of knowledge   PROBLEM LIST: Problem List/Patient Goals Date to be addressed Date deferred Reason deferred Estimated date of resolution  "better ways to cope with my frustration" 07/22/14     "find some positive coping skills"                                                 DISCHARGE CRITERIA:  Improved stabilization in mood, thinking, and/or behavior Motivation to continue treatment in a less acute level of care  PRELIMINARY DISCHARGE PLAN: Outpatient therapy  PATIENT/FAMIILY INVOLVEMENT: This treatment plan has been presented to and reviewed with the patient, Raymond [name]. The patient has been given the opportunity to ask questions and make suggestions.  Jule SerKent, Traci Gafford Gail 07/22/2014, 4:05 PM

## 2014-07-23 DIAGNOSIS — IMO0002 Reserved for concepts with insufficient information to code with codable children: Secondary | ICD-10-CM | POA: Diagnosis present

## 2014-07-23 LAB — GLUCOSE, CAPILLARY
Glucose-Capillary: 76 mg/dL (ref 70–99)
Glucose-Capillary: 294 mg/dL — ABNORMAL HIGH (ref 70–99)
Glucose-Capillary: 327 mg/dL — ABNORMAL HIGH (ref 70–99)
Glucose-Capillary: 174 mg/dL — ABNORMAL HIGH (ref 70–99)

## 2014-07-23 MED ORDER — BUPROPION HCL ER (XL) 150 MG PO TB24
150.0000 mg | ORAL_TABLET | Freq: Every day | ORAL | Status: DC
  Administered 2014-07-23 – 2014-07-26 (×4): 150 mg via ORAL
  Filled 2014-07-23 (×6): qty 1

## 2014-07-23 MED ORDER — TRAZODONE HCL 50 MG PO TABS
50.0000 mg | ORAL_TABLET | Freq: Every evening | ORAL | Status: DC | PRN
  Administered 2014-07-23: 50 mg via ORAL
  Filled 2014-07-23 (×2): qty 1

## 2014-07-23 NOTE — Progress Notes (Signed)
Pt may wear lip ring on unit per [place], AC. Due to pt's religious practices.

## 2014-07-23 NOTE — Tx Team (Signed)
Interdisciplinary Treatment Plan Update (Adult)   Date: 07/23/2014   Time Reviewed: 10:25 AM  Progress in Treatment:  Attending groups: yes  Participating in groups:  Yes  Taking medication as prescribed: Yes  Tolerating medication: Yes  Family/Significant othe contact made: Not yet. SPE required for this pt.    Patient understands diagnosis: Yes, AEB seeking treatment for SI attempt (drank bleach), increased depression, cough medication abuse, and for medication stabilization.  Discussing patient identified problems/goals with staff: Yes  Medical problems stabilized or resolved: Yes  Denies suicidal/homicidal ideation: Yes during self report.  Patient has not harmed self or Others: Yes  New problem(s) identified:  Discharge Plan or Barriers: Pt plans to return home [facility]. CSW assessing.  Additional comments: Raymond MontaneVictor A [name] is an 29 y.o. male who presents to [facility] accompanied by mother, [name] and brother, Irving ShowsMiguel. Patient reports suicidal attempt by drinking bleach after argument with his wife. Patient reported gesture was "a self harming attempt and if it would have ended in suicide that would have been fine." Patient stated he has been having ongoing issues since 29 y/o and reported multiple suicide attempts in the past. Patient identified current stressors as his wife, work and fiances. Patient endorses depressive sx such as fatigue, irritability, worthlessness and guilt. Patient stated he sleeps more than 12 hours a day. Patient denies HI and AVH. Patient reported ongoing use of cough medication when he gets in arguments with wife. Patient stated he will take 16 -30mg  tablets, sometimes daily depending on level of stress. Patient denies any other drug use.Patient Ox4. Patient presents with flat, depressed mood. 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 Patient currently living with his wife and 29 year old  daughter. Patient reported being diabetic and taking over the counter insulin. Patient stated he currently does not see a MD for insulin. Patient reports 5 previous hospitalizations with [facility]. Patient stated he last had outpatient therapy with therapist in [place] in 2009 but no current outpatient. Mother and brother discuss concern about patient's dependence on cough medication with him having 29 year old daughter. Patient stated he is open for outpatient tx following inpatient stabilization.  Reason for Continuation of Hospitalization: Medication management Depression/anxiety/mood instability Estimated length of stay: 3-5 days  For review of initial/current patient goals, please see plan of care.  Attendees:  Patient:    Family:    Physician: [name] MD 07/23/2014 10:28 AM   Nursing: Meriam SpragueBeverly RN; Cala BradfordKimberly RN; ArthurtownBrittany RN 07/23/2014 10:28 AM   Clinical Social Worker Nyja Westbrook Smart, LCSWA  07/23/2014 10:28 AM   Other: Earley AbideKristin D. LCSWA; Gerilyn PilgrimQuylle H. LCSW 07/23/2014 10:28 AM   Other: Darden DatesJennifer C. Nurse CM 07/23/2014 10:28 AM   Other: Liliane Badeolora Sutton, Community Care Coordinator  07/23/2014 10:28 AM   Other:    Scribe for Treatment Team:  Herbert SetaHeather Smart LCSWA 07/23/2014 10:28 AM

## 2014-07-23 NOTE — Progress Notes (Signed)
Recreation Therapy Notes  Animal-Assisted Activity (AAA) Program Checklist/Progress Notes Patient Eligibility Criteria Checklist & Daily Group note for Rec Tx Intervention  Date: 04.26.2016 Time: 2:45pm Location: [address] Dayroom    AAA/T Program Assumption of Risk Form signed by Patient/ or Parent Legal Guardian yes  Patient is free of allergies or sever asthma yes  Patient reports no fear of animals yes  Patient reports no history of cruelty to animals yes  Patient understands his/her participation is voluntary yes  Patient washes hands before animal contact yes  Patient washes hands after animal contact yes  Behavioral Response: Engaged, Appropriate   Education: Hand Washing, Appropriate Animal Interaction   Education Outcome: Acknowledges education.   Clinical Observations/Feedback: Patient actively engaged in session, interacting appropriately with therapy dog and peers.   Timiya Howells L Durell Lofaso, LRT/CTRS  Gautam Langhorst L 07/23/2014 5:01 PM 

## 2014-07-23 NOTE — H&P (Signed)
Psychiatric Admission Assessment Adult  Patient Identification: Raymond [name] MRN:  [id number] Date of Evaluation:  07/23/2014 Chief Complaint:  Mood Disorder Unspecified Principal Diagnosis: <principal problem not specified> Diagnosis:   Patient Active Problem List   Diagnosis Date Noted  . Severe recurrent major depression without psychotic features [F33.2] 07/22/2014  . Major depressive disorder, recurrent, severe without psychotic features [F33.2]   . Suicide attempt [T14.91]    History of Present Illness:: 29 Y/o male who states he got in argument with wife and "impulsively" drank some bleach. States they had gotten into an argument earlier about bills etc. States that wife found out he had been taking cough medicine. That led into another argument. States that she told him she was not going to let him see his daughter. States when they argue he takes "something" to sedate himself as he cant handle it. States he used to take cough medicine "recreationally" until she got pregnant. States a year ago he started using it after they argue. States he does things he knows he could be hurt and does not avoid. When he was 17 went trough a "Bulimia" phase. Now sometimes he induces his vomit when he feels too stress The initial assessment at the ED is as follows: Raymond [name] is an 29 y.o. male who presents to [facility] accompanied by mother, Arna Medici and brother,  Shows. Patient reports suicidal attempt by drinking bleach after argument with his wife. Patient reported gesture was "a self harming attempt and if it would have ended in suicide that would have been fine." Patient stated he has been having ongoing issues since 29 y/o and reported multiple suicide attempts in the past. Patient identified current stressors as his wife, work and fiances. Patient endorses depressive sx such as fatigue, irritability, worthlessness and guilt. Patient stated he sleeps more than 12 hours a day. Patient denies HI and AVH.  Patient reported ongoing use of cough medication when he gets in arguments with wife. Patient stated he will take 16 -30mg  tablets, sometimes daily depending on level of stress. Patient denies any other drug use. Patient Ox4. Patient presents with flat, depressed mood. Patient stated he is currently working full time as a Designer, fashion/clothing at [employer]. Patient currently living with his wife and 61 year old daughter. Patient reported being diabetic and taking over the counter insulin. Patient stated he currently does not see a MD for insulin.  Patient reports 5 previous hospitalizations with most recent in July 2015 at [facility]. Patient stated he last had outpatient therapy with therapist in [place] in 2009 but no current outpatient.  Mother and brother discuss concern about patient's dependence on cough medication with him having 55 year old daughter. Patient stated he is open for outpatient tx following inpatient stabilization.   Elements:  Location:  mood disorder cough medicine abuse. Quality:  using cough medicine to cope with stress. Severity:  severe. Timing:  every day. Duration:  builing up as the conflict with wife escalates. Context:  mood disorder, increased conflict with wife using cough medicine to cope drank bleach "impulisvely" history of suicidal gesutres self harm behavior . Associated Signs/Symptoms: Depression Symptoms:  depressed mood, anhedonia, hypersomnia, fatigue, difficulty concentrating, anxiety, hypersomnia, loss of energy/fatigue, disturbed sleep,  "obsession with death" since he was diagnosed with Diabetes Last year or so the thoughts of suicide have been coming back (Hypo) Manic Symptoms:  Irritable Mood, Labiality of Mood, Anxiety Symptoms:  "desesperacion" Psychotic Symptoms:  Paranoia, PTSD Symptoms: Negative Total  Time spent with patient: 45 minutes  Past Medical History:  Past Medical History  Diagnosis Date  . Gallstones   . Diabetes  mellitus without complication     Past Surgical History  Procedure Laterality Date  . Cholecystectomy     Family History: History reviewed. No pertinent family history.  Mother; Bipolar Disorder parents used to drink do drugs Social History:  History  Alcohol Use  . Yes     History  Drug Use No    History   Social History  . Marital Status: Single    Spouse Name: N/A  . Number of Children: N/A  . Years of Education: N/A   Social History Main Topics  . Smoking status: Never Smoker   . Smokeless tobacco: Never Used  . Alcohol Use: Yes  . Drug Use: No  . Sexual Activity: Yes    Birth Control/ Protection: None   Other Topics Concern  . 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  HENT: Negative.   Eyes: Negative.   Respiratory: Negative.   Cardiovascular: Negative.   Gastrointestinal: Negative.   Genitourinary: Negative.   Musculoskeletal: Negative.   Skin: Negative.   Neurological: Positive for weakness.  Endo/Heme/Allergies: Negative.   Psychiatric/Behavioral: Positive for depression. The patient is nervous/anxious.     Blood pressure 134/91, pulse 82, temperature 97.6 F (36.4 C), temperature source Oral, resp. rate 18, height 5\' 10"  (1.778 m), weight 87.544 kg (193 lb), SpO2 99 %.Body mass index is 27.69 kg/(m^2).  General Appearance: Fairly Groomed  Patent attorneyye Contact::  Fair  Speech:  Clear and Coherent  Volume:   Normal  Mood:  Anxious and Depressed  Affect:  Restricted  Thought Process:  Coherent and Goal Directed  Orientation:  Full (Time, Place, and Person)  Thought Content:  symtptoms events worries concerns  Suicidal Thoughts:  No  Homicidal Thoughts:  No  Memory:  Immediate;   Fair Recent;   Fair Remote;   Fair  Judgement:  Fair  Insight:  Present  Psychomotor Activity:  Restlessness  Concentration:  Fair  Recall:  FiservFair  Fund of Knowledge:Fair  Language: Fair  Akathisia:  No  Handed:  Right  AIMS (if indicated):     Assets:  Desire for Improvement  ADL's:  Intact  Cognition: WNL  Sleep:      Risk to Self: Is patient at risk for suicide?: No Risk to Others:   Prior Inpatient Therapy:  [facility], [facility] a month before they closed Prior Outpatient Therapy:  Not currently  Alcohol Screening: 1. How often do you have a drink containing alcohol?: Monthly or less 2. How many drinks containing alcohol do you have on a typical day when you are drinking?: 1 or 2 3. How often do you have six or more drinks on one occasion?: Never Preliminary Score: 0 9. Have you or  someone else been injured as a result of your drinking?: No 10. Has a relative or friend or a doctor or another health worker been concerned about your drinking or suggested you cut down?: No Alcohol Use Disorder Identification Test Final Score (AUDIT): 1 Brief Intervention: AUDIT score less than 7 or less-screening does not suggest unhealthy drinking-brief intervention not indicated  Allergies:   Allergies  Allergen Reactions  . Tramadol Other (See Comments)    Hypoglycemia Shock  . Ibuprofen Swelling   Lab Results:  Results for orders placed or performed during the hospital encounter of 07/22/14 (from the past 48 hour(s))  Glucose, capillary     Status: Abnormal   Collection Time: 07/22/14  4:57 PM  Result Value Ref Range   Glucose-Capillary 246 (H) 70 - 99 mg/dL  Glucose, capillary     Status: Abnormal    Collection Time: 07/22/14  9:01 PM  Result Value Ref Range   Glucose-Capillary 145 (H) 70 - 99 mg/dL   Comment 1 Notify RN   Glucose, capillary     Status: None   Collection Time: 07/23/14  6:18 AM  Result Value Ref Range   Glucose-Capillary 76 70 - 99 mg/dL   Current Medications: Current Facility-Administered Medications  Medication Dose Route Frequency Provider Last Rate Last Dose  . alum & mag hydroxide-simeth (MAALOX/MYLANTA) 200-200-20 MG/5ML suspension 30 mL  30 mL Oral PRN Earney Navy, NP      . insulin aspart (novoLOG) injection 0-9 Units  0-9 Units Subcutaneous TID WC Earney Navy, NP   3 Units at 07/22/14 1725  . insulin aspart protamine- aspart (NOVOLOG MIX 70/30) injection 25 Units  25 Units Subcutaneous BID WC Earney Navy, NP   25 Units at 07/23/14 0752  . ondansetron (ZOFRAN) tablet 4 mg  4 mg Oral Q8H PRN Earney Navy, NP      . traZODone (DESYREL) tablet 50 mg  50 mg Oral QHS Earney Navy, NP   50 mg at 07/22/14 2255   PTA Medications: Prescriptions prior to admission  Medication Sig Dispense Refill Last Dose  . insulin NPH-regular Human (NOVOLIN 70/30) (70-30) 100 UNIT/ML injection Inject 25 Units into the skin 2 (two) times daily with a meal.   07/21/2014 at Unknown time    Previous Psychotropic Medications: Yes Wellbutrin, Depakote, Abilify, Lexapro ( made the depression worst) , Effexor Lamictal Risperdal,   Substance Abuse History in the last 12 months:  Yes.      Consequences of Substance Abuse: Legal Consequences:  one DWI  Results for orders placed or performed during the hospital encounter of 07/22/14 (from the past 72 hour(s))  Glucose, capillary     Status: Abnormal   Collection Time: 07/22/14  4:57 PM  Result Value Ref Range   Glucose-Capillary 246 (H) 70 - 99 mg/dL  Glucose, capillary     Status: Abnormal   Collection Time: 07/22/14  9:01 PM  Result Value Ref Range   Glucose-Capillary 145 (H) 70 - 99 mg/dL   Comment  1 Notify RN   Glucose, capillary     Status: None   Collection Time: 07/23/14  6:18 AM  Result Value Ref Range   Glucose-Capillary 76 70 - 99 mg/dL    Observation Level/Precautions:  15 minute checks  Laboratory:  As per the ED  Psychotherapy:  Individual/group  Medications:  Will start a trial with Wellbutrin XL 150 mg in AM  Consultations:    Discharge Concerns:    Estimated  LOS: 3-5 days  Other:     Psychological Evaluations: No   Treatment Plan Summary: Daily contact with patient to assess and evaluate symptoms and progress in treatment and Medication management Supportive approach/coping skills Cough medicine abuse; assess for withdrawal/develop a relapse prevention plan Depression; will start a trial with Wellbutrin XL 150 mg that he found effective in the past Stress management: will work on developing healthier stress management skills to take the place of the cough medicine Will work with CBT/mindfulness  Medical Decision Making:  Review of Psycho-Social Stressors (1), Review or order clinical lab tests (1), Review of Medication Regimen & Side Effects (2) and Review of New Medication or Change in Dosage (2)  I certify that inpatient services furnished can reasonably be expected to improve the patient's condition.   Camran Keady A 4/26/20168:44 AM

## 2014-07-23 NOTE — [facility] Group Notes (Signed)
[facility] LCSW Group Therapy  07/23/2014 1:23 PM  Type of Therapy:  Group Therapy  Participation Level:  Active  Participation Quality:  Attentive  Affect:  Appropriate  Cognitive:  Alert and Oriented  Insight:  Engaged  Engagement in Therapy:  Engaged  Modes of Intervention:  Confrontation, Discussion, Education, Exploration, Problem-solving, Rapport Building, Socialization and Support  Summary of Progress/Problems: [facility] Speaker came to talk about his personal journey with substance abuse and addiction. The pt processed ways by which to relate to the speaker. MHA speaker provided handouts and educational information pertaining to groups and services offered by the [facility].  Smart, Raymond [name] LCSWA 07/23/2014, 1:23 PM
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
## 2014-07-23 NOTE — Plan of Care (Signed)
Problem: Ineffective individual coping Goal: STG: Patient will remain free from self harm Outcome: Progressing Patient remains free from self harm. 15 minute checks continued per protocol for patient safety.   Problem: Diagnosis: Increased Risk For Suicide Attempt Goal: STG-Patient Will Attend All Groups On The Unit Outcome: Progressing Patient is attending unit groups today. Goal: STG-Patient Will Comply With Medication Regime Outcome: Progressing Patient has adhered to medication regimen today with ease.     

## 2014-07-23 NOTE — [facility] Suicide Risk Assessment (Signed)
[facility]   Nursing information obtained from:    Demographic factors:    Current Mental Status:    Loss Factors:    Historical Factors:    Risk Reduction Factors:    Total Time spent with patient: 45 minutes Principal Problem: Severe recurrent major depression without psychotic features Diagnosis:   Patient Active Problem List   Diagnosis Date Noted  . 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 Severe recurrent major depression without psychotic features [F33.2] 07/22/2014  . Suicide attempt [T14.91]      Continued Clinical Symptoms:  Alcohol Use Disorder Identification Test Final Score (AUDIT): 1 The "Alcohol Use Disorders Identification Test", Guidelines for Use in Primary Care, Second Edition.  World Science writerHealth Organization [facility](WHO). Score between 0-7:  no or low risk or alcohol related problems. Score between 8-15:  moderate risk of alcohol related problems. Score between 16-19:  high risk of alcohol related problems. Score 20 or above:  warrants further diagnostic evaluation for alcohol dependence and treatment.   CLINICAL FACTORS:   Depression:   Comorbid alcohol abuse/dependence Impulsivity  Psychiatric Specialty Exam: Physical Exam  ROS  Blood pressure 134/91, pulse 82, temperature 97.6 F (36.4 C), temperature source Oral, resp. rate 18, height 5\' 10"  (1.778 m), weight 87.544 kg (193 lb), SpO2 99 %.Body mass index is 27.69 kg/(m^2).   COGNITIVE FEATURES THAT CONTRIBUTE TO RISK:  Closed-mindedness, Polarized thinking and Thought constriction (tunnel vision)    SUICIDE RISK:   Moderate:  Frequent suicidal ideation with limited intensity, and duration, some specificity in terms of plans, no associated intent, good self-control, limited dysphoria/symptomatology, some risk factors present, and identifiable protective factors, including available and accessible social support.  PLAN OF CARE: Supportive approach/coping skills               Cough medicine abuse: work a relapse prevention plan                               Depression; start a trial with Wellbutrin XL 150 mg in AM                               CBT/mindfulness, strategies to deal with the stress without having to use cough medicine  Medical Decision Making:  Review of Psycho-Social Stressors (1), Review or order clinical lab tests (1), Review of Medication Regimen & Side Effects (2) and Review of New Medication or Change in Dosage (2)  I certify that inpatient services furnished can reasonably be expected to improve the patient's condition.   Shaun Runyon A 07/23/2014, 6:23 PM

## 2014-07-23 NOTE — Progress Notes (Signed)
D: Patient is alert and oriented. Pt's mood and affect is pleasant/euthymic and appropriate to circumstance. Pt denies SI/HI and AVH. Pt is attending unit groups. Pt denies having any complaints today. A: Active listening by RN. Encouragement/Support provided to pt. Medication education reviewed with pt. Scheduled medications administered per providers orders (See MAR). 15 minute checks continued per protocol for patient safety.  R: Patient cooperative and receptive to nursing interventions. Pt remains safe.

## 2014-07-23 NOTE — Progress Notes (Signed)
D:Patient in the hallway on approach.  Patient appears anxious but states he is depressed.  Patient states he has bee trying to eat well to keep his blood sugar under control.  Patient denies SI/HI and denies AVH. A: Staff to monitor Q 15 mins for safety.  Encouragement and support offered.  Scheduled medications administered per orders. R: Patient remains safe on the unit.  Patient attended group tonight.  Patient visible on the unit and interacting with peers.  Patient taking administered medications.

## 2014-07-23 NOTE — [facility] Group Notes (Signed)
[facility] Group Notes:  (Nursing/MHT/Case Management/Adjunct)  Date:  07/23/2014  Time:  0915am  Type of Therapy:  Nurse Education  Participation Level:  Active  Participation Quality:  Appropriate and Attentive  Affect:  Appropriate  Cognitive:  Alert and Appropriate  Insight:  Appropriate and Good  Engagement in Group:  Engaged  Modes of Intervention:  Discussion, Education and Support  Summary of Progress/Problems: Patient attended group, remained engaged, and responded appropriately when prompted.  Lendell CapriceGuthrie, Kestrel Mis A 07/23/2014, 10:37 AM

## 2014-07-24 LAB — GLUCOSE, CAPILLARY
Glucose-Capillary: 274 mg/dL — ABNORMAL HIGH (ref 70–99)
Glucose-Capillary: 157 mg/dL — ABNORMAL HIGH (ref 70–99)
Glucose-Capillary: 285 mg/dL — ABNORMAL HIGH (ref 70–99)
Glucose-Capillary: 87 mg/dL (ref 70–99)

## 2014-07-24 MED ORDER — LISINOPRIL 5 MG PO TABS
5.0000 mg | ORAL_TABLET | Freq: Every day | ORAL | Status: DC
Start: 2014-07-24 — End: 2014-07-28
  Administered 2014-07-24 – 2014-07-28 (×5): 5 mg via ORAL
  Filled 2014-07-24 (×4): qty 1
  Filled 2014-07-24: qty 14
  Filled 2014-07-24: qty 1
  Filled 2014-07-24: qty 14
  Filled 2014-07-24 (×2): qty 1

## 2014-07-24 MED ORDER — TRAZODONE HCL 100 MG PO TABS
100.0000 mg | ORAL_TABLET | Freq: Every evening | ORAL | Status: DC | PRN
  Administered 2014-07-24 – 2014-07-25 (×2): 100 mg via ORAL
  Filled 2014-07-24 (×6): qty 1

## 2014-07-24 NOTE — Progress Notes (Signed)
[facility] MD Progress Note  07/24/2014 4:38 PM Raymond [name]  MRN:  [id number] Subjective:  Raymond [name] endorses that he is still dealing with depression and worry but that he has started feeling what he felt before on Wellbutrin, her worry decreasing. She heard from his wife and they are going to pursue the separation. She is putting some pressure on him as was told that their daughter keeps asking for him. Once they separate as they work different shifts, their daughter is going to be with them on different times. He is trying to get in the mind set that he will not have to use the Dextromethorphan.  Principal Problem: Severe recurrent major depression without psychotic features Diagnosis:   Patient Active Problem List   Diagnosis Date Noted  . 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 Severe recurrent major depression without psychotic features [F33.2] 07/22/2014  . Suicide attempt [T14.91]    Total Time spent with patient: 30 minutes   Past Medical History:  Past Medical History  Diagnosis Date  . Gallstones   . Diabetes mellitus without complication     Past Surgical History  Procedure Laterality Date  . Cholecystectomy     Family History: History reviewed. No pertinent family history. Social History:  History  Alcohol Use  . Yes     History  Drug Use No    History   Social History  . Marital Status: Single    Spouse Name: N/A  . Number of Children: N/A  . Years of Education: N/A   Social History Main Topics  . Smoking status: Never Smoker   . Smokeless tobacco: Never Used  . Alcohol Use: Yes  . Drug Use: No  . Sexual Activity: Yes    Birth Control/ Protection: None   Other Topics Concern  . None   Social History Narrative   Additional History:    Sleep: Poor  Appetite:  Poor   Assessment:   Musculoskeletal: Strength & Muscle Tone: within normal limits Gait & Station: normal Patient leans: N/A   Psychiatric Specialty Exam: Physical Exam   Review of Systems  Constitutional: Negative.   HENT: Negative.   Eyes: Negative.   Respiratory: Negative.   Cardiovascular: Negative.   Gastrointestinal: Negative.   Genitourinary: Negative.   Musculoskeletal: Negative.   Skin: Negative.   Neurological: Negative.   Endo/Heme/Allergies: Negative.   Psychiatric/Behavioral: Positive for depression and substance abuse. The patient is nervous/anxious.     Blood pressure 153/94, pulse 89, temperature 97.8 F (36.6 C), temperature source Oral, resp. rate 18, height  (1.778 m), weight 87.544 kg (193 lb), SpO2 99 %.Body mass index is 27.69 kg/(m^2).  General Appearance: Fairly Groomed  Patent attorney::  Fair  Speech:  Clear and Coherent  Volume:  Normal  Mood:  Anxious and Depressed  Affect:  Restricted  Thought Process:  Coherent and Goal Directed  Orientation:  Full (Time, Place, and Person)  Thought Content:  symptoms events worries concerns  Suicidal Thoughts:  No  Homicidal Thoughts:  No  Memory:  Immediate;   Fair Recent;   Fair Remote;   Fair  Judgement:  Fair  Insight:  Present and Shallow  Psychomotor Activity:  Restlessness  Concentration:  Fair  Recall:  [employer] of Knowledge:Fair  Language: Fair  Akathisia:  No  Handed:  Right  AIMS (if indicated):     Assets:  Desire for Improvement Housing Vocational/Educational  ADL's:  Intact  Cognition: WNL  Sleep:  Current Medications: Current Facility-Administered Medications  Medication Dose Route Frequency Provider Last Rate Last Dose  . alum & mag hydroxide-simeth (MAALOX/MYLANTA) 200-200-20 MG/5ML suspension 30 mL  30 mL Oral PRN Earney NavyJosephine C Onuoha, NP   30 mL at 07/24/14 0801  . buPROPion (WELLBUTRIN XL) 24 hr tablet 150 mg  150 mg Oral Daily Rachael FeeIrving A Maple Odaniel, MD   150 mg at 07/24/14 0801  . insulin aspart (novoLOG) injection 0-9 Units  0-9 Units Subcutaneous TID WC Earney NavyJosephine C Onuoha, NP   2 Units at 07/24/14 1207  . insulin aspart protamine- aspart  (NOVOLOG MIX 70/30) injection 25 Units  25 Units Subcutaneous BID WC Earney NavyJosephine C Onuoha, NP   25 Units at 07/24/14 0802  . lisinopril (PRINIVIL,ZESTRIL) tablet 5 mg  5 mg Oral Daily Rachael FeeIrving A Audric Venn, MD   5 mg at 07/24/14 1157  . ondansetron (ZOFRAN) tablet 4 mg  4 mg Oral Q8H PRN Earney NavyJosephine C Onuoha, NP      . traZODone (DESYREL) tablet 100 mg  100 mg Oral QHS,MR X 1 Rachael FeeIrving A Isla Sabree, MD        Lab Results:  Results for orders placed or performed during the hospital encounter of 07/22/14 (from the past 48 hour(s))  Glucose, capillary     Status: Abnormal   Collection Time: 07/22/14  4:57 PM  Result Value Ref Range   Glucose-Capillary 246 (H) 70 - 99 mg/dL  Glucose, capillary     Status: Abnormal   Collection Time: 07/22/14  9:01 PM  Result Value Ref Range   Glucose-Capillary 145 (H) 70 - 99 mg/dL   Comment 1 Notify RN   Glucose, capillary     Status: None   Collection Time: 07/23/14  6:18 AM  Result Value Ref Range   Glucose-Capillary 76 70 - 99 mg/dL  Glucose, capillary     Status: Abnormal   Collection Time: 07/23/14 11:55 AM  Result Value Ref Range   Glucose-Capillary 294 (H) 70 - 99 mg/dL  Glucose, capillary     Status: Abnormal   Collection Time: 07/23/14  4:48 PM  Result Value Ref Range   Glucose-Capillary 327 (H) 70 - 99 mg/dL  Glucose, capillary     Status: Abnormal   Collection Time: 07/23/14  9:00 PM  Result Value Ref Range   Glucose-Capillary 174 (H) 70 - 99 mg/dL  Glucose, capillary     Status: Abnormal   Collection Time: 07/24/14  6:15 AM  Result Value Ref Range   Glucose-Capillary 274 (H) 70 - 99 mg/dL  Glucose, capillary     Status: Abnormal   Collection Time: 07/24/14 12:06 PM  Result Value Ref Range   Glucose-Capillary 157 (H) 70 - 99 mg/dL   Comment 1 Notify RN     Physical Findings: AIMS: Facial and Oral Movements Muscles of Facial Expression: None, normal Lips and Perioral Area: None, normal Jaw: None, normal Tongue: None, normal,Extremity  Movements Upper (arms, wrists, hands, fingers): None, normal Lower (legs, knees, ankles, toes): None, normal, Trunk Movements Neck, shoulders, hips: None, normal, Overall Severity Severity of abnormal movements (highest score from questions above): None, normal Incapacitation due to abnormal movements: None, normal Patient's awareness of abnormal movements (rate only patient's report): No Awareness, Dental Status Current problems with teeth and/or dentures?: No Does patient usually wear dentures?: No  CIWA:    COWS:     Treatment Plan Summary: Daily contact with patient to assess and evaluate symptoms and progress in treatment and Medication management Supportive approach/coping  skills Depression; continue the Wellbutrin and plan to increase it to 300 mg in AM Anxiety; work with CBT/mindfulness DXM abuse/dependence; work a relapse prevention plan Diabetes; optimize glucose control, consult dietitian  Medical Decision Making:  Review of Psycho-Social Stressors (1), Review or order clinical lab tests (1), Review of Medication Regimen & Side Effects (2) and Review of New Medication or Change in Dosage (2)     Uri Covey A 07/24/2014, 4:38 PM

## 2014-07-24 NOTE — [facility] Group Notes (Signed)
[facility] LCSW Group Therapy  07/24/2014 2:34 PM  Type of Therapy:  Group Therapy  Participation Level:  Active  Participation Quality:  Attentive  Affect:  Appropriate  Cognitive:  Alert and Oriented  Insight:  Improving  Engagement in Therapy:  Engaged  Modes of Intervention:  Confrontation, Discussion, Education, Exploration, Problem-solving, Rapport Building, Socialization and Support  Summary of Progress/Problems: Emotion Regulation: This group focused on both positive and negative emotion identification and allowed group members to process ways to identify feelings, regulate negative emotions, and find healthy ways to manage internal/external emotions. Group members were asked to reflect on a time when their reaction to an emotion led to a negative outcome and explored how alternative responses using emotion regulation would have benefited them. Group members were also asked to discuss a time when emotion regulation was utilized when a negative emotion was experienced. Raymond [name] was attentive and engaged during today's processing group. He shared that he struggles with anger and believes that this is the primary problem in his marriage. He plans to go to anger management classes and take medication to manage his mental health symptoms. Raymond [name] continues to make progress in the group setting and was able to identify positive coping skills "getting back into martial arts, walking with my daughter every day, getting exercise, and seeing a therapist."    Smart, Raymond [name] LCSWA  07/24/2014, 2:34 PM

## 2014-07-24 NOTE — Progress Notes (Signed)
Found patient in dayroom with peers. His affect and eye contact are appropriate this morning. Mood pleasant. He denies any pain however complaining of heartburn. Denies withdrawal. Support offered. Medicated per orders. Maalox given with relief. He denies SI/HI and remains safe. Lawrence MarseillesFriedman, Juel Bellerose Eakes

## 2014-07-24 NOTE — [facility] Group Notes (Signed)
[facility]   07/24/2014 9:53 AM  Participation Quality:  Appropriate   Mood/Affect:  Appropriate  Depression Rating:  5 "normal range"  Anxiety Rating:  5  Thoughts of Suicide:  No Will you contract for safety?   NA  Current AVH:  No  Plan for Discharge/Comments:  Pt reports that he is returning home but he and his wife are separated. Pt reports no withdrawals and is interested in anger management-[facility]ental [facility]. [facility]. He reports some swelling in his right hand and right knee.   Transportation Means: unknown at this time.   Supports: some family support/mom  Smart, OncologistHeather LCSWA

## 2014-07-24 NOTE — Progress Notes (Signed)
Recreation Therapy Notes  Date: 04.27.2016 Time: 9:30am Location: [address] Group Room   Group Topic: Stress Management  Goal Area(s) Addresses:  Patient will actively participate in stress management techniques presented during session.   Behavioral Response: Engaged, Attentive  Intervention: Stress management techniques  Activity :  Deep Breathing and Guided Imagery. LRT provided instruction and demonstration on practice of Guided Imagery. Technique was coupled with deep breathing.   Education:  Stress Management, Discharge Planning.   Education Outcome: Acknowledges education  Clinical Observations/Feedback: Patient actively engaged in listening to guided imagery script about walking through a forrest. Patient expressed no complaints during session and confidence that he could practice independently post d/c.    Marykay Lexenise L Jamyla Ard, LRT/CTRS  Madox Corkins L 07/24/2014 3:30 PM

## 2014-07-24 NOTE — Plan of Care (Signed)
Problem: Diagnosis: Increased Risk For Suicide Attempt Goal: LTG-Patient Will Report Absence of Withdrawal Symptoms LTG (by discharge): Patient will report absence of withdrawal symptoms.  Outcome: Progressing Patient denying withdrawal, no physical complaints. Goal: STG-Patient Will Report Suicidal Feelings to Staff Outcome: Progressing Patient denies SI and is safe on unit

## 2014-07-24 NOTE — Progress Notes (Signed)
D   Pt is appropriate and pleasant on approach   He has spent a lot of time this evening talking on the phone and has become angry and talking loud   He interacts well with his peers A   Verbal support given   Medications administered and effectiveness monitored  Q 15 min checks R   Pt is safe at present time

## 2014-07-25 LAB — GLUCOSE, CAPILLARY
Glucose-Capillary: 307 mg/dL — ABNORMAL HIGH (ref 70–99)
Glucose-Capillary: 326 mg/dL — ABNORMAL HIGH (ref 70–99)
Glucose-Capillary: 140 mg/dL — ABNORMAL HIGH (ref 70–99)
Glucose-Capillary: 441 mg/dL — ABNORMAL HIGH (ref 70–99)
Glucose-Capillary: 395 mg/dL — ABNORMAL HIGH (ref 70–99)
Glucose-Capillary: 239 mg/dL — ABNORMAL HIGH (ref 70–99)

## 2014-07-25 MED ORDER — INSULIN ASPART 100 UNIT/ML ~~LOC~~ SOLN: 11.0000 [IU] | Freq: Once | SUBCUTANEOUS | Status: DC

## 2014-07-25 NOTE — Progress Notes (Signed)
Adult Psychoeducational Group Note  Date:  07/25/2014 Time: 0900am  Group Topic/Focus:  Orientation:   The focus of this group is to educate the patient on the purpose and policies of crisis stabilization and provide a format to answer questions about their admission.  The group details unit policies and expectations of patients while admitted.  Participation Level:  Active  Participation Quality:  Attentive  Affect:  Flat  Cognitive:  Alert  Insight: Improving  Engagement in Group:  Improving  Modes of Intervention:  Activity, Discussion, Orientation and Support  Additional Comments:  Pt able to identify one goal to accomplish today.   Aurora Maskwyman, Keeanna Villafranca E 07/25/2014, 9:41 AM

## 2014-07-25 NOTE — Progress Notes (Signed)
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  He says he has worked there for years and is nervous about trying to find another job.  He said the bright spot of the day was visiting with his 503 yo daughter a little while ago.  He says he has always worked since he was young and needs to take care of his family.  He says he and his wife are having some problems, but is hopeful they can work it out.  Pt denies SI/HI/AV.  He plans to return home or somewhere he will be near his daughter.  He hopes to find a job soon.  Pt makes his needs known to staff.  Support and encouragement offered.  Pt is pleasant and cooperative with staff.  Safety maintained with q15 minute checks.

## 2014-07-25 NOTE — Progress Notes (Addendum)
Patient ID: Raymond MontaneVictor A Tagliaferro, male   DOB: [birth year], 29 y.o.   MRN: [id number]  Pt CBG at 1711 was 441. CBG rechecked at 1732 - 395. Shuvon Rankin, NP notified. Sliding scale given based on 1732 CBG, 9 units of Novolog. One time order of 11 units of Novolog aspart not given.

## 2014-07-25 NOTE — Progress Notes (Signed)
NUTRITION ASSESSMENT  Pt identified as at risk on the Malnutrition Screen Tool  INTERVENTION: 1. Educated patient on the importance of nutrition and encouraged intake of food and beverages; talked about healthy food options to help in controlling DM. 2. Discussed weight goals. 3. Supplements: not needed at this time  NUTRITION DIAGNOSIS: None  Goal: Pt to meet >/= 90% of their estimated nutrition needs.  Monitor:  PO intake  Assessment:  Pt seen for consult for assessment of nutrition requirements and status. Pt reports hx of DM that is well managed at home but has been a little more difficult here due to food choices and medications.   Pt reports stable weight and good appetite PTA and now. He is likely meeting his needs based on dietary recall. No needs at this time.  29 y.o. male  Height: Ht Readings from Last 1 Encounters:  07/22/14 5\' 10"  (1.778 m)    Weight: Wt Readings from Last 1 Encounters:  07/22/14 193 lb (87.544 kg)    Weight Hx: Wt Readings from Last 10 Encounters:  07/22/14 193 lb (87.544 kg)    BMI:  Body mass index is 27.69 kg/(m^2). Pt meets criteria for overweight status based on current BMI.  Estimated Nutritional Needs: Kcal: 25-30 kcal/kg Protein: > 1 gram protein/kg Fluid: 1 ml/kcal  Diet Order: Diet Carb Modified Fluid consistency:: Thin; Room service appropriate?: Yes Pt is also offered choice of unit snacks mid-morning and mid-afternoon.  Pt is eating as desired.   Lab results and medications reviewed.    Trenton GammonJessica Kendy Haston, RD, LDN Inpatient Clinical Dietitian Pager # [phone] After hours/weekend pager # [phone]

## 2014-07-25 NOTE — Progress Notes (Signed)
Patient ID: Raymond [name], male   DOB: Apr 09, 1985, 29 y.o.   MRN: [id number]   Pt currently presents with a labile affect that can go from flat to animated.  Pt presents with anxious behavior. Per self inventory, pt rates depression at a 5, hopelessness 5 and anxiety 5. Pt's daily goal is "getting s**t done" and they intend to do so by "getting s**t done." Pt notified writer that his place of work shut down and he is worried about finding another job when he is discharged. Pt reports good sleep, normal energy and a good appetite.   Pt provided with medications per providers orders. Pt's labs and vitals were monitored throughout the day. Pt supported emotionally and encouraged to express concerns and questions. Pt educated on medications. Pt consulted with a 1:1. Pt requested more diabetic snack options, cafeteria and secretary notified, snacks ordered.   Pt's safety ensured with 15 minute and environmental checks. Pt currently denies SI/HI and A/V hallucinations. Pt verbally agrees to seek staff if SI/HI or A/VH occurs and to consult with staff before acting on these thoughts.

## 2014-07-25 NOTE — Progress Notes (Signed)
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 This is the last weekend the place is going to be open. He states he has worked there for years. States he at this time does not know what he is going to do. This will add more stress to his life. Still endorsing depression. Understands that it will take a while before the medication works Principal Problem: Severe recurrent major depression without psychotic features Diagnosis:   Patient Active Problem List   Diagnosis Date Noted  . 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 Severe recurrent major depression without psychotic features [F33.2] 07/22/2014  . Suicide attempt [T14.91]    Total Time spent with patient: 30 minutes   Past Medical History:  Past Medical History  Diagnosis Date  . Gallstones   . Diabetes mellitus without complication     Past Surgical History  Procedure Laterality Date  . Cholecystectomy     Family History: History reviewed. No pertinent family history. Social History:  History  Alcohol Use  . Yes     History  Drug Use No    History   Social History  . Marital Status: Single    Spouse Name: N/A  . Number of Children: N/A  . Years of Education: N/A   Social History Main Topics  . Smoking status: Never Smoker   . Smokeless tobacco: Never Used  . Alcohol Use: Yes  . Drug Use: No  . Sexual Activity: Yes    Birth Control/ Protection: None   Other Topics Concern  . None   Social History Narrative   Additional History:    Sleep: Fair  Appetite:  Fair   Assessment:   Musculoskeletal: Strength & Muscle Tone: within normal limits Gait & Station: normal Patient leans: N/A   Psychiatric Specialty Exam: Physical Exam  Review of Systems  Constitutional: Negative.   HENT: Negative.   Eyes: Negative.   Respiratory: Negative.   Cardiovascular: Negative.   Gastrointestinal:  Negative.   Genitourinary: Negative.   Musculoskeletal: Negative.   Skin: Negative.   Neurological: Negative.   Endo/Heme/Allergies: Negative.   Psychiatric/Behavioral: Positive for depression and substance abuse. The patient is nervous/anxious and has insomnia.     Blood pressure 119/65, pulse 82, temperature 97.8 F (36.6 C), temperature source Oral, resp. rate 18, height  (1.778 m), weight 87.544 kg (193 lb), SpO2 99 %.Body mass index is 27.69 kg/(m^2).  General Appearance: Fairly Groomed  Patent attorney::  Fair  Speech:  Clear and Coherent  Volume:  Decreased  Mood:  Anxious and Depressed  Affect:  anxious sad worried  Thought Process:  Coherent and Goal Directed  Orientation:  Full (Time, Place, and Person)  Thought Content:  symptoms events worries concerns  Suicidal Thoughts:  No  Homicidal Thoughts:  No  Memory:  Immediate;   Fair Recent;   Fair Remote;   Fair  Judgement:  Fair  Insight:  Present and Shallow  Psychomotor Activity:  Restlessness  Concentration:  Fair  Recall:  [employer] of Knowledge:Fair  Language: Fair  Akathisia:  No  Handed:  Right  AIMS (if indicated):     Assets:  Desire for Improvement  ADL's:  Intact  Cognition: WNL  Sleep:        Current Medications: Current Facility-Administered Medications  Medication Dose Route Frequency Provider Last Rate Last Dose  . alum &  mag hydroxide-simeth (MAALOX/MYLANTA) 200-200-20 MG/5ML suspension 30 mL  30 mL Oral PRN Earney Navy, NP   30 mL at 07/24/14 0801  . buPROPion (WELLBUTRIN XL) 24 hr tablet 150 mg  150 mg Oral Daily Rachael Fee, MD   150 mg at 07/25/14 0804  . insulin aspart (novoLOG) injection 0-9 Units  0-9 Units Subcutaneous TID WC Earney Navy, NP   1 Units at 07/25/14 1224  . insulin aspart protamine- aspart (NOVOLOG MIX 70/30) injection 25 Units  25 Units Subcutaneous BID WC Earney Navy, NP   25 Units at 07/25/14 0806  . lisinopril (PRINIVIL,ZESTRIL) tablet 5 mg  5  mg Oral Daily Rachael Fee, MD   5 mg at 07/25/14 0804  . ondansetron (ZOFRAN) tablet 4 mg  4 mg Oral Q8H PRN Earney Navy, NP      . traZODone (DESYREL) tablet 100 mg  100 mg Oral QHS,MR X 1 Rachael Fee, MD   100 mg at 07/24/14 2148    Lab Results:  Results for orders placed or performed during the hospital encounter of 07/22/14 (from the past 48 hour(s))  Glucose, capillary     Status: Abnormal   Collection Time: 07/23/14  4:48 PM  Result Value Ref Range   Glucose-Capillary 327 (H) 70 - 99 mg/dL  Glucose, capillary     Status: Abnormal   Collection Time: 07/23/14  9:00 PM  Result Value Ref Range   Glucose-Capillary 174 (H) 70 - 99 mg/dL  Glucose, capillary     Status: Abnormal   Collection Time: 07/24/14  6:15 AM  Result Value Ref Range   Glucose-Capillary 274 (H) 70 - 99 mg/dL  Glucose, capillary     Status: Abnormal   Collection Time: 07/24/14 12:06 PM  Result Value Ref Range   Glucose-Capillary 157 (H) 70 - 99 mg/dL   Comment 1 Notify RN   Glucose, capillary     Status: Abnormal   Collection Time: 07/24/14  5:28 PM  Result Value Ref Range   Glucose-Capillary 285 (H) 70 - 99 mg/dL   Comment 1 Notify RN    Comment 2 Document in Chart   Glucose, capillary     Status: None   Collection Time: 07/24/14  9:19 PM  Result Value Ref Range   Glucose-Capillary 87 70 - 99 mg/dL  Glucose, capillary     Status: Abnormal   Collection Time: 07/25/14  6:01 AM  Result Value Ref Range   Glucose-Capillary 307 (H) 70 - 99 mg/dL  Glucose, capillary     Status: Abnormal   Collection Time: 07/25/14  7:57 AM  Result Value Ref Range   Glucose-Capillary 326 (H) 70 - 99 mg/dL  Glucose, capillary     Status: Abnormal   Collection Time: 07/25/14 12:23 PM  Result Value Ref Range   Glucose-Capillary 140 (H) 70 - 99 mg/dL    Physical Findings: AIMS: Facial and Oral Movements Muscles of Facial Expression: None, normal Lips and Perioral Area: None, normal Jaw: None, normal Tongue:  None, normal,Extremity Movements Upper (arms, wrists, hands, fingers): None, normal Lower (legs, knees, ankles, toes): None, normal, Trunk Movements Neck, shoulders, hips: None, normal, Overall Severity Severity of abnormal movements (highest score from questions above): None, normal Incapacitation due to abnormal movements: None, normal Patient's awareness of abnormal movements (rate only patient's report): No Awareness, Dental Status Current problems with teeth and/or dentures?: No Does patient usually wear dentures?: No  CIWA:    COWS:  Treatment Plan Summary: Daily contact with patient to assess and evaluate symptoms and progress in treatment and Medication management Supportive approach/copign skills Depression; continue to work with Wellbutrin and optimize response/CBT, mindfulness DXM abuse; work a relapse prevention plan Loss of his job; process the loss and help make plans to address his unemployment Overall work to improve his coping with stressors develop healthier coping skills not use cough medicine  Medical Decision Making:  Review of Psycho-Social Stressors (1), Review of Medication Regimen & Side Effects (2) and Review of New Medication or Change in Dosage (2)     Santiel Topper A 07/25/2014, 4:04 PM

## 2014-07-25 NOTE — [facility] Group Notes (Signed)
[facility] LCSW Group Therapy  07/25/2014 3:54 PM  Type of Therapy:  Group Therapy  Participation Level:  Active  Participation Quality:  Attentive  Affect:  Appropriate  Cognitive:  Alert and Oriented  Insight:  Improving  Engagement in Therapy:  Engaged  Modes of Intervention:  Confrontation, Discussion, Education, Exploration, Problem-solving, Rapport Building, Socialization and Support  Summary of Progress/Problems: Today's Topic: Overcoming Obstacles. Pt identified obstacles faced currently and processed barriers involved in overcoming these obstacles. Pt identified steps necessary for overcoming these obstacles and explored motivation (internal and external) for facing these difficulties head on. Pt further identified one area of concern in their lives and chose a skill of focus pulled from their "toolbox." Alecia LemmingVictor was attentive and engaged during today's processing group. He shared that his biggest obstacle involves "putting myself first" and paying for medications. He talked about how his wife does not allow him to have time to himself because of the young child they co parent. Alecia LemmingVictor also shared that he just found out his place of employment is shutting down on Saturday and is stressed about finding work. 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   Smart, Mirriam Vadala LCSWA  07/25/2014, 3:54 PM

## 2014-07-26 LAB — GLUCOSE, CAPILLARY
Glucose-Capillary: 250 mg/dL — ABNORMAL HIGH (ref 70–99)
Glucose-Capillary: 159 mg/dL — ABNORMAL HIGH (ref 70–99)
Glucose-Capillary: 199 mg/dL — ABNORMAL HIGH (ref 70–99)
Glucose-Capillary: 134 mg/dL — ABNORMAL HIGH (ref 70–99)

## 2014-07-26 MED ORDER — MIRTAZAPINE 30 MG PO TABS: 30.0000 mg | ORAL_TABLET | Freq: Every day | ORAL | Status: DC

## 2014-07-26 MED ORDER — MIRTAZAPINE 15 MG PO TABS
15.0000 mg | ORAL_TABLET | Freq: Every evening | ORAL | Status: DC | PRN
  Filled 2014-07-26: qty 14

## 2014-07-26 MED ORDER — BUPROPION HCL ER (XL) 300 MG PO TB24
300.0000 mg | ORAL_TABLET | Freq: Every day | ORAL | Status: DC
  Administered 2014-07-27 – 2014-07-28 (×2): 300 mg via ORAL
  Filled 2014-07-26 (×2): qty 1
  Filled 2014-07-26 (×2): qty 14
  Filled 2014-07-26: qty 1

## 2014-07-26 NOTE — [facility] Group Notes (Signed)
[facility] LCSW Group Therapy  07/26/2014 4:06 PM  Type of Therapy:  Group Therapy  Participation Level:  Active  Participation Quality:  Attentive  Affect:  Appropriate  Cognitive:  Alert and Oriented  Insight:  Engaged  Engagement in Therapy:  Engaged  Modes of Intervention:  Confrontation, Discussion, Education, Exploration, Problem-solving, Rapport Building, Socialization and Support  Summary of Progress/Problems: Feelings around Relapse. Group members discussed the meaning of relapse and shared personal stories of relapse, how it affected them and others, and how they perceived themselves during this time. Group members were encouraged to identify triggers, warning signs and coping skills used when facing the possibility of relapse. Social supports were discussed and explored in detail. Post Acute Withdrawal Syndrome (handout provided) was introduced and examined. Pt's were encouraged to ask questions, talk about key points associated with PAWS, and process this information in terms of relapse prevention. Raymond [name] was attentive and engaged during today's processing group and shared his struggles with anger and mood instability. 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 Raymond [name] shared that he is motivated to stay on his mental health medications because "they really help me stay balanced."   Raymond [name], Raymond [name] LCSWA  07/26/2014, 4:06 PM

## 2014-07-26 NOTE — Progress Notes (Signed)
D: Took over patient's care @ 2330. Patient in bed sleeping. Respiration regular and unlabored. No sign of distress noted at this time A: 15 mins checks for safety. R: Patient remains asleep. Pt is safe.   

## 2014-07-26 NOTE — Progress Notes (Signed)
  [facility] :  Will you be returning to the same living situation after discharge:  Yes,  home At discharge, do you have transportation home?: yes, pt's wife is picking up pt after lunch on Sunday 07/28/14. Pt able to d/c per Dr. Dub MikesLugo on this date.  Do you have the ability to pay for your medications: Yes,  mental health  Release of information consent forms completed and submitted to medical records by CSW.  Patient to Follow up at: Follow-up Information    [facility] [place] On 07/29/2014.   Why:  Appt on this date at 9:45AM for hospital follow-up/medication management/assessment for therapy services.    Contact information:   [address] [place], [place] [address] Phone: [phone] Fax: [phone]    Pt also given information to [facility] for PCP/orange card application.   Patient denies SI/HI: Yes,  during group/self report.     Safety Planning and Suicide Prevention discussed: Yes,  SPE completed with pt's wife. SPI pamphlet provided to pt and he was encouraged to share infromation with support network, ask questions, and talk about any concerns relating to SPE.   Have you used any form of tobacco in the last 30 days? (Cigarettes, Smokeless Tobacco, Cigars, and/or Pipes): No  Has patient been referred to the Quitline?: N/A patient is not a smoker  Smart, WalnutHeather LCSWA  07/26/2014, 10:37 AM

## 2014-07-26 NOTE — Progress Notes (Signed)
[facility]  07/26/2014 5:47 PM Melissa MontaneVictor A Linderman  MRN:  [id number] Subjective:  Continues to deal with the loss of his job. He is trying to turn it around into a positive. He is still endorsing depression. He did not sleep well last night. The Trazodone was not as effective and it still lingered in the morning. He states he would rather try to sleep on his own tonight. He is still talking to his wife and trying to figure out how are they going to handle things when he goes back  home. As far as the DXM abuse he feels that once he is not dealing with the on going conflict with his wife he is going to be able to cope better with the other stuff and not recur to using the cough medicine Principal Problem: Severe recurrent major depression without psychotic features Diagnosis:   Patient Active Problem List   Diagnosis Date Noted  . 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 Severe recurrent major depression without psychotic features [F33.2] 07/22/2014  . Suicide attempt [T14.91]    Total Time spent with patient: 30 minutes   Past Medical History:  Past Medical History  Diagnosis Date  . Gallstones   . Diabetes mellitus without complication     Past Surgical History  Procedure Laterality Date  . Cholecystectomy     Family History: History reviewed. No pertinent family history. Social History:  History  Alcohol Use  . Yes     History  Drug Use No    History   Social History  . Marital Status: Single    Spouse Name: N/A  . Number of Children: N/A  . Years of Education: N/A   Social History Main Topics  . Smoking status: Never Smoker   . Smokeless tobacco: Never Used  . Alcohol Use: Yes  . Drug Use: No  . Sexual Activity: Yes    Birth Control/ Protection: None   Other Topics Concern  . None   Social History Narrative   Additional History:    Sleep: Poor  Appetite:  Fair   Assessment:   Musculoskeletal: Strength & Muscle Tone: within normal  limits Gait & Station: normal Patient leans: N/A   Psychiatric Specialty Exam: Physical Exam  Review of Systems  Constitutional: Negative.   HENT: Negative.   Eyes: Negative.   Respiratory: Negative.   Cardiovascular: Negative.   Gastrointestinal: Negative.   Genitourinary: Negative.   Musculoskeletal: Negative.   Skin: Negative.   Neurological: Negative.   Endo/Heme/Allergies: Negative.   Psychiatric/Behavioral: Positive for depression. The patient is nervous/anxious and has insomnia.     Blood pressure 141/77, pulse 82, temperature 98.7 F (37.1 C), temperature source Oral, resp. rate 16, height 5\' 10"  (1.778 m), weight 87.544 kg (193 lb), SpO2 99 %.Body mass index is 27.69 kg/(m^2).  General Appearance: Fairly Groomed  Patent attorneyye Contact::  Fair  Speech:  Clear and Coherent  Volume:  Normal  Mood:  Anxious and Depressed  Affect:  Restricted  Thought Process:  Coherent and Goal Directed  Orientation:  Full (Time, Place, and Person)  Thought Content:  symptoms events worries concerns  Suicidal Thoughts:  No  Homicidal Thoughts:  No  Memory:  Immediate;   Fair Recent;   Fair Remote;   Fair  Judgement:  Fair  Insight:  Present  Psychomotor Activity:  Restlessness  Concentration:  Fair  Recall:  FiservFair  Fund of Knowledge:Fair  Language: Fair  Akathisia:  No  Handed:  Right  AIMS (if indicated):     Assets:  Desire for Improvement Housing Social Support  ADL's:  Intact  Cognition: WNL  Sleep:  Number of Hours: 5.25     Current Medications: Current Facility-Administered Medications  Medication Dose Route Frequency Provider Last Rate Last Dose  . alum & mag hydroxide-simeth (MAALOX/MYLANTA) 200-200-20 MG/5ML suspension 30 mL  30 mL Oral PRN Earney Navy, NP   30 mL at 07/24/14 0801  . [START ON 07/27/2014] buPROPion (WELLBUTRIN XL) 24 hr tablet 300 mg  300 mg Oral Daily Rachael Fee, MD      . insulin aspart (novoLOG) injection 0-9 Units  0-9 Units Subcutaneous  TID WC Earney Navy, NP   2 Units at 07/26/14 1735  . insulin aspart (novoLOG) injection 11 Units  11 Units Subcutaneous Once Shuvon B Rankin, NP   11 Units at 07/25/14 1802  . insulin aspart protamine- aspart (NOVOLOG MIX 70/30) injection 25 Units  25 Units Subcutaneous BID WC Earney Navy, NP   25 Units at 07/26/14 1735  . lisinopril (PRINIVIL,ZESTRIL) tablet 5 mg  5 mg Oral Daily Rachael Fee, MD   5 mg at 07/26/14 0758  . mirtazapine (REMERON) tablet 15 mg  15 mg Oral QHS PRN Rachael Fee, MD      . ondansetron [facility]) tablet 4 mg  4 mg Oral Q8H PRN Earney Navy, NP        Lab Results:  Results for orders placed or performed during the hospital encounter of 07/22/14 (from the past 48 hour(s))  Glucose, capillary     Status: None   Collection Time: 07/24/14  9:19 PM  Result Value Ref Range   Glucose-Capillary 87 70 - 99 mg/dL  Glucose, capillary     Status: Abnormal   Collection Time: 07/25/14  6:01 AM  Result Value Ref Range   Glucose-Capillary 307 (H) 70 - 99 mg/dL  Glucose, capillary     Status: Abnormal   Collection Time: 07/25/14  7:57 AM  Result Value Ref Range   Glucose-Capillary 326 (H) 70 - 99 mg/dL  Glucose, capillary     Status: Abnormal   Collection Time: 07/25/14 12:23 PM  Result Value Ref Range   Glucose-Capillary 140 (H) 70 - 99 mg/dL  Glucose, capillary     Status: Abnormal   Collection Time: 07/25/14  5:11 PM  Result Value Ref Range   Glucose-Capillary 441 (H) 70 - 99 mg/dL   Comment 1 Notify RN   Glucose, capillary     Status: Abnormal   Collection Time: 07/25/14  5:32 PM  Result Value Ref Range   Glucose-Capillary 395 (H) 70 - 99 mg/dL  Glucose, capillary     Status: Abnormal   Collection Time: 07/25/14  8:16 PM  Result Value Ref Range   Glucose-Capillary 239 (H) 70 - 99 mg/dL   Comment 1 Notify RN    Comment 2 Document in Chart   Glucose, capillary     Status: Abnormal   Collection Time: 07/26/14  6:13 AM  Result Value Ref Range    Glucose-Capillary 250 (H) 70 - 99 mg/dL   Comment 1 Notify RN    Comment 2 Document in Chart   Glucose, capillary     Status: Abnormal   Collection Time: 07/26/14 12:10 PM  Result Value Ref Range   Glucose-Capillary 159 (H) 70 - 99 mg/dL  Glucose, capillary     Status: Abnormal   Collection Time: 07/26/14  5:11 PM  Result Value Ref Range   Glucose-Capillary 199 (H) 70 - 99 mg/dL    Physical Findings: AIMS: Facial and Oral Movements Muscles of Facial Expression: None, normal Lips and Perioral Area: None, normal Jaw: None, normal Tongue: None, normal,Extremity Movements Upper (arms, wrists, hands, fingers): None, normal Lower (legs, knees, ankles, toes): None, normal, Trunk Movements Neck, shoulders, hips: None, normal, Overall Severity Severity of abnormal movements (highest score from questions above): None, normal Incapacitation due to abnormal movements: None, normal Patient's awareness of abnormal movements (rate only patient's report): No Awareness, Dental Status Current problems with teeth and/or dentures?: No Does patient usually wear dentures?: No  CIWA:    COWS:     Treatment Plan Summary: Daily contact with patient to assess and evaluate symptoms and progress in treatment and Medication management Supportive approach/coping skills Depression; will increase the Wellbutrin to 300 mg AM/will use CBT, mindfulness Insomnia; will D/C the Trazodone will make Remeron 15 mg HS PRN sleep availabe DXM abuse; 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

## 2014-07-26 NOTE — Tx Team (Signed)
Interdisciplinary Treatment Plan Update (Adult)   Date: 07/26/2014   Time Reviewed: 10:19 AM  Progress in Treatment:  Attending groups: yes  Participating in groups:  Yes  Taking medication as prescribed: Yes  Tolerating medication: Yes  Family/Significant othe contact made: SPE completed with pt's wife Patient understands diagnosis: Yes, AEB seeking treatment for SI attempt (drank bleach), increased depression, cough medication abuse, and for medication stabilization.  Discussing patient identified problems/goals with staff: Yes  Medical problems stabilized or resolved: Yes  Denies suicidal/homicidal ideation: Yes during self report and group.  Patient has not harmed self or Others: Yes  New problem(s) identified:  Discharge Plan or Barriers: Pt will return home and [facility]. Pt also given Mental health association pamphlet and is interested in pursuing the anger management and peer support options.  Additional comments: Raymond [name] is an 29 y.o. male who presents to [facility] accompanied by mother, Raymond [name] and brother, Raymond [name]. Patient reports suicidal attempt by drinking bleach after argument with his wife. Patient reported gesture was "a self harming attempt and if it would have ended in suicide that would have been fine." Patient stated he has been having ongoing issues since 29 y/o and reported multiple suicide attempts in the past. Patient identified current stressors as his wife, work and fiances. Patient endorses depressive sx such as fatigue, irritability, worthlessness and guilt. Patient stated he sleeps more than 12 hours a day. Patient denies HI and AVH. Patient reported ongoing use of cough medication when he gets in arguments with wife. Patient stated he will take 16 -30mg  tablets, sometimes daily depending on level of stress. Patient denies any other drug use.Patient Ox4. Patient presents with flat, depressed mood. 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 Patient currently living with his wife and 29 year old daughter. Patient reported being diabetic and taking over the counter insulin. Patient stated he currently does not see a MD for insulin. Patient reports 5 previous hospitalizations with [facility]. Patient stated he last had outpatient therapy with therapist in [place] in 2009 but no current outpatient. Mother and brother discuss concern about patient's dependence on cough medication with him having 29 year old daughter. Patient stated he is open for outpatient tx following inpatient stabilization.   07/26/14: Pt has been pleasant and active in the group setting. Denies SI/HI/AVH and no withdrawals. Pt reports that he is ready to return home on Sunday and reports that his wife is supportive, but they are planning to take some time apart. Pt rates anxiety and depression as 5/10 today "that is my baseline."  Reason for Continuation of Hospitalization: Medication stabilization  Estimated length of stay: 2 days (pt scheduled for d/c on Sunday)  For review of initial/current patient goals, please see plan of care.  Attendees:  Patient:    Family:    Physician: [name] MD 07/26/2014 10:19 AM   Nursing: Conard NovakVivian, Marian, Britney T RN 07/26/2014 10:19 AM   Clinical Social Worker Larell Baney Smart, LCSWA  07/26/2014 10:19 AM   Other: Earley AbideKristin D. LCSWA 07/26/2014 10:19 AM   Other: Darden DatesJennifer C. Nurse CM 07/26/2014 10:19 AM   Other: Liliane Badeolora Sutton, Community Care Coordinator  07/26/2014 10:19 AM   Other:    Scribe for Treatment Team:  The Sherwin-WilliamsHeather Smart LCSWA 07/26/2014 10:19 AM

## 2014-07-26 NOTE — Progress Notes (Addendum)
Recreation Therapy Notes  Date: 04.29.2016 Time: 9:30am Location: [address] Group Room   Group Topic: Stress Management  Goal Area(s) Addresses:  Patient will actively participate in stress management techniques presented during session.   Behavioral Response: Engaged, Attentive, Appropriate   Intervention: Stress management techniques  Activity :  Deep Breathing and Progressive Body Scan. LRT provided instruction and demonstration on practice of Progressive Body Scan. Technique was coupled with deep breathing.   Education:  Stress Management, Discharge Planning.   Education Outcome: Acknowledges education  Clinical Observations/Feedback: Patient actively engaged in presented techniques, patient expressed no difficulties with practicing techniques and reported he felt confident practicing independently post d/c.    Marykay Lexenise L Terriah Reggio, LRT/CTRS  Palmina Clodfelter L 07/26/2014 1:33 PM

## 2014-07-26 NOTE — [facility] Suicide Risk Assessment (Signed)
[facility] INPATIENT:  Family/Significant Other Suicide Prevention Education  Suicide Prevention Education:  Education Completed; Raymond LamingChristina [name] (pt's wife) [phone] has been identified by the patient as the family member/significant other with whom the patient will be residing, and identified as the person(s) who will aid the patient in the event of a mental health crisis (suicidal ideations/suicide attempt).  With written consent from the patient, the family member/significant other has been provided the following suicide prevention education, prior to the and/or following the discharge of the patient.  The suicide prevention education provided includes the following:  Suicide risk factors  Suicide prevention and interventions  National Suicide Hotline telephone number  [facility] assessment telephone number  [facility] Emergency Assistance 911  [facility] and/or Residential Mobile Crisis Unit telephone number  Request made of family/significant other to:  Remove weapons (e.g., guns, rifles, knives), all items previously/currently identified as safety concern.    Remove drugs/medications (over-the-counter, prescriptions, illicit drugs), all items previously/currently identified as a safety concern.  The family member/significant other verbalizes understanding of the suicide prevention education information provided.  The family member/significant other agrees to remove the items of safety concern listed above.  Smart, Geraldine Tesar LCSWA  07/26/2014, 10:33 AM

## 2014-07-26 NOTE — Progress Notes (Signed)
Pt has been up and active in the milieu today.  He rated all his depression hopelessness and anxiety a 5 on his self-inventory.  He denies any S/H ideation or A/V/H.  His goal today,"work on myself" by "talking in groups think what is being said and plan ways to care for myself"  He plans to [facility] [place].

## 2014-07-26 NOTE — [facility] Group Notes (Signed)
[facility] Dis[facility] Planning [facility] Note   07/26/2014 11:48 AM  Participation Quality:    Appropriate   Mood/Affect:  Appropriate  Depression Rating:  5  Anxiety Rating:  5  Thoughts of Suicide:  No Will you contract for safety?   NA  Current AVH:  No  Plan for Discharge/Comments:  Pt plans to d/c on Sunday to [place] in [place] and plans [facility] [place].        Transportation Means: wife  Supports: Naval architectwife/family   Smart, OncologistHeather LCSWA

## 2014-07-27 DIAGNOSIS — F1910 Other psychoactive substance abuse, uncomplicated: Secondary | ICD-10-CM

## 2014-07-27 DIAGNOSIS — F332 Major depressive disorder, recurrent severe without psychotic features: Principal | ICD-10-CM

## 2014-07-27 LAB — GLUCOSE, CAPILLARY
Glucose-Capillary: 194 mg/dL — ABNORMAL HIGH (ref 70–99)
Glucose-Capillary: 240 mg/dL — ABNORMAL HIGH (ref 70–99)
Glucose-Capillary: 264 mg/dL — ABNORMAL HIGH (ref 70–99)
Glucose-Capillary: 290 mg/dL — ABNORMAL HIGH (ref 70–99)

## 2014-07-27 MED ORDER — ACETAMINOPHEN 325 MG PO TABS
650.0000 mg | ORAL_TABLET | Freq: Four times a day (QID) | ORAL | Status: DC | PRN
  Administered 2014-07-27 – 2014-07-28 (×3): 650 mg via ORAL
  Filled 2014-07-27 (×3): qty 2

## 2014-07-27 NOTE — [facility] Group Notes (Signed)
The focus of this group is to educate the patient on the purpose and policies of crisis stabilization and provide a format to answer questions about their admission.  The group details unit policies and expectations of patients while admitted. Patient attended group and was engaged. Patient had no questions or concerns about this orientation group.

## 2014-07-27 NOTE — [facility] Group Notes (Signed)
[facility] Group Notes:  (Clinical Social Work)  07/27/2014     10-11AM  Summary of Progress/Problems:   The main focus of today's process group was to learn how to use a decisional balance exercise to move forward in the Stages of Change, which were described and discussed.  Motivational Interviewing and a worksheet were utilized to help patients explore in depth the perceived benefits and costs of unhealthy coping techniques, as well as the  benefits and costs of replacing that with a healthy coping skills.   The patient expressed that their unhealthy coping involves self-injurious behaviors.  He expressed that a healthy coping skill he uses is to run with his daughter in the stroller.  Type of Therapy:  Group Therapy - Process   Participation Level:  Active  Participation Quality:  Attentive, Sharing and Supportive  Affect:  Blunted  Cognitive:  Alert, Appropriate and Oriented  Insight:  Engaged  Engagement in Therapy:  Engaged  Modes of Intervention:  Education, Motivational Interviewing  Ambrose MantleMareida Grossman-Orr, LCSW 07/27/2014, 2:33 PM

## 2014-07-27 NOTE — [facility] Group Notes (Signed)
[facility] Group Notes:  (Nursing/MHT/Case Management/Adjunct)  Date:  07/27/2014  Time:  11:59 AM  Type of Therapy:  Psychoeducational Skills  Participation Level:  Active  Participation Quality:  Sharing and Supportive  Affect:  Appropriate  Cognitive:  Alert and Appropriate  Insight:  Improving  Engagement in Group:  Engaged  Modes of Intervention:  Discussion and Education  Summary of Progress/Problems: This group was to discuss the topic of the day which is healthy coping skills. Patient was engaged in group, supportive, and sharing. Patient reported his coping skill is to work on his art or push his daughter in a Financial risk analyststroller.   Zamariyah Furukawa E 07/27/2014, 11:59 AM

## 2014-07-27 NOTE — Plan of Care (Signed)
Problem: Diagnosis: Increased Risk For Suicide Attempt Goal: LTG-Patient Will Report Improved Mood and Deny Suicidal LTG (by discharge) Patient will report improved mood and deny suicidal ideation.  Outcome: Progressing Patient able to admit mood is improving. Rates depression and hopelessness 5/10. Patient denies SI.

## 2014-07-27 NOTE — Progress Notes (Signed)
Patient ID: Raymond [name], male   DOB: February 03, 1986, 29 y.o.   MRN: [id number] D: Patient reports decreased anxiety and depressive symptoms. Pt thought process is organized and behavior is appropriate. Pt observed in dayroom interacting / watching TV well with peer. Pt attended evening AA group and engaged in discussion. Cooperative with assessment.   A: Met with pt 1:1.  Writer encouraged pt to discuss feelings. Pt encouraged to come to staff with any questions or concerns.   R: Patient is safe on the unit. Pt denies S/HI/AVH and pain.

## 2014-07-27 NOTE — Progress Notes (Signed)
Patient ID: Raymond MontaneVictor A [name], male   DOB: [birth year], [age] y.o.   MRN: [id number]  DAR: Pt. Denies SI/HI and A/V Hallucinations. Patient reports sleep is good, appetite is good, and energy level is normal. Patient rates his depression, hopelessness, and anxiety at 5/10. Patient reports mild discomfort due to jamming his finger accidentally and received PRN medication and ice pack and it proved to be helpful. Support and encouragement provided to the patient. Scheduled medications administered to patient per physician's orders. Patient is receptive and cooperative with staff. Patient is seen in the milieu interacting with peers and is attending groups. Q15 minute checks are maintained for safety.

## 2014-07-27 NOTE — Progress Notes (Signed)
Patient ID: Raymond [name], male   DOB: 1986/01/02, 29 y.o.   MRN: [id number] [facility] MD Progress Note  07/27/2014 1:09 PM Raymond [name]  MRN:  [id number]  Subjective:  Valerio says he feels regular. Rates his depression at #5. Says he has always had an underlying depressive episodes most of his life. He says he is currently doing well on his medications. Says he slept well last night. Complained of puffy ankles, (mild). Instructed to elevate legs when in bed or sitting up in a chair. He currently denies any SIHI, AVH.  Principal Problem: Severe recurrent major depression without psychotic features  Diagnosis:   Patient Active Problem List   Diagnosis Date Noted  . 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 Severe recurrent major depression without psychotic features [F33.2] 07/22/2014  . Suicide attempt [T14.91]    Total Time spent with patient: 30 minutes   Past Medical History:  Past Medical History  Diagnosis Date  . Gallstones   . Diabetes mellitus without complication     Past Surgical History  Procedure Laterality Date  . Cholecystectomy     Family History: History reviewed. No pertinent family history. Social History:  History  Alcohol Use  . Yes     History  Drug Use No    History   Social History  . Marital Status: Single    Spouse Name: N/A  . Number of Children: N/A  . Years of Education: N/A   Social History Main Topics  . Smoking status: Never Smoker   . Smokeless tobacco: Never Used  . Alcohol Use: Yes  . Drug Use: No  . Sexual Activity: Yes    Birth Control/ Protection: None   Other Topics Concern  . None   Social History Narrative   Additional History:    Sleep: Poor  Appetite:  Fair   Assessment:   Musculoskeletal: Strength & Muscle Tone: within normal limits Gait & Station: normal Patient leans: N/A  Psychiatric Specialty Exam: Physical Exam  ROS  Blood pressure 137/82, pulse 73, temperature 97.6 F (36.4 C),  temperature source Oral, resp. rate 20, height  (1.778 m), weight 87.544 kg (193 lb), SpO2 99 %.Body mass index is 27.69 kg/(m^2).  General Appearance: Fairly Groomed  Patent attorney::  Fair  Speech:  Clear and Coherent  Volume:  Normal  Mood:  Anxious and Depressed  Affect:  Restricted  Thought Process:  Coherent and Goal Directed  Orientation:  Full (Time, Place, and Person)  Thought Content:  symptoms events worries concerns  Suicidal Thoughts:  No  Homicidal Thoughts:  No  Memory:  Immediate;   Fair Recent;   Fair Remote;   Fair  Judgement:  Fair  Insight:  Present  Psychomotor Activity:  Restlessness  Concentration:  Fair  Recall:  [employer] of Knowledge:Fair  Language: Fair  Akathisia:  No  Handed:  Right  AIMS (if indicated):     Assets:  Desire for Improvement Housing Social Support  ADL's:  Intact  Cognition: WNL  Sleep:  Number of Hours: 6.25   Current Medications: Current Facility-Administered Medications  Medication Dose Route Frequency Provider Last Rate Last Dose  . acetaminophen (TYLENOL) tablet 650 mg  650 mg Oral Q6H PRN Adonis Brook, NP   650 mg at 07/27/14 1114  . alum & mag hydroxide-simeth (MAALOX/MYLANTA) 200-200-20 MG/5ML suspension 30 mL  30 mL Oral PRN Earney Navy, NP   30 mL at 07/24/14 0801  . buPROPion (WELLBUTRIN XL)  24 hr tablet 300 mg  300 mg Oral Daily Rachael FeeIrving A Lugo, MD   300 mg at 07/27/14 [id number]  . insulin aspart (novoLOG) injection 0-9 Units  0-9 Units Subcutaneous TID WC Earney NavyJosephine C Onuoha, NP   3 Units at 07/27/14 1210  . insulin aspart (novoLOG) injection 11 Units  11 Units Subcutaneous Once Shuvon B Rankin, NP   11 Units at 07/25/14 1802  . insulin aspart protamine- aspart (NOVOLOG MIX 70/30) injection 25 Units  25 Units Subcutaneous BID WC Earney NavyJosephine C Onuoha, NP   25 Units at 07/27/14 [phone]  . lisinopril (PRINIVIL,ZESTRIL) tablet 5 mg  5 mg Oral Daily Rachael FeeIrving A Lugo, MD   5 mg at 07/27/14 [phone]  . mirtazapine (REMERON) tablet  15 mg  15 mg Oral QHS PRN Rachael FeeIrving A Lugo, MD      . ondansetron [facility](ZOFRAN) tablet 4 mg  4 mg Oral Q8H PRN Earney NavyJosephine C Onuoha, NP        Lab Results:  Results for orders placed or performed during the hospital encounter of 07/22/14 (from the past 48 hour(s))  Glucose, capillary     Status: Abnormal   Collection Time: 07/25/14  5:11 PM  Result Value Ref Range   Glucose-Capillary 441 (H) 70 - 99 mg/dL   Comment 1 Notify RN   Glucose, capillary     Status: Abnormal   Collection Time: 07/25/14  5:32 PM  Result Value Ref Range   Glucose-Capillary 395 (H) 70 - 99 mg/dL  Glucose, capillary     Status: Abnormal   Collection Time: 07/25/14  8:16 PM  Result Value Ref Range   Glucose-Capillary 239 (H) 70 - 99 mg/dL   Comment 1 Notify RN    Comment 2 Document in Chart   Glucose, capillary     Status: Abnormal   Collection Time: 07/26/14  6:13 AM  Result Value Ref Range   Glucose-Capillary 250 (H) 70 - 99 mg/dL   Comment 1 Notify RN    Comment 2 Document in Chart   Glucose, capillary     Status: Abnormal   Collection Time: 07/26/14 12:10 PM  Result Value Ref Range   Glucose-Capillary 159 (H) 70 - 99 mg/dL  Glucose, capillary     Status: Abnormal   Collection Time: 07/26/14  5:11 PM  Result Value Ref Range   Glucose-Capillary 199 (H) 70 - 99 mg/dL  Glucose, capillary     Status: Abnormal   Collection Time: 07/26/14  9:04 PM  Result Value Ref Range   Glucose-Capillary 134 (H) 70 - 99 mg/dL   Comment 1 Notify RN   Glucose, capillary     Status: Abnormal   Collection Time: 07/27/14  6:36 AM  Result Value Ref Range   Glucose-Capillary 194 (H) 70 - 99 mg/dL   Comment 1 Notify RN   Glucose, capillary     Status: Abnormal   Collection Time: 07/27/14 11:35 AM  Result Value Ref Range   Glucose-Capillary 240 (H) 70 - 99 mg/dL   Comment 1 Notify RN    Comment 2 Document in Chart     Physical Findings: AIMS: Facial and Oral Movements Muscles of Facial Expression: None, normal Lips and  Perioral Area: None, normal Jaw: None, normal Tongue: None, normal,Extremity Movements Upper (arms, wrists, hands, fingers): None, normal Lower (legs, knees, ankles, toes): None, normal, Trunk Movements Neck, shoulders, hips: None, normal, Overall Severity Severity of abnormal movements (highest score from questions above): None, normal Incapacitation due to abnormal movements:  None, normal Patient's awareness of abnormal movements (rate only patient's report): No Awareness, Dental Status Current problems with teeth and/or dentures?: No Does patient usually wear dentures?: No  CIWA:    COWS:     Treatment Plan Summary: Daily contact with patient to assess and evaluate symptoms and progress in treatment and Medication management Supportive approach/coping skills Depression; continue the Wellbutrin XL 300 mg AM/will use CBT, mindfulness Insomnia; continue Remeron 15 mg HS PRN sleep availabe DXM abuse; 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 Instruck to elevate lower extremities when in bed or sitting up on a chair.  Medical Decision Making:  Review of Psycho-Social Stressors (1), Review of Medication Regimen & Side Effects (2) and Review of New Medication or Change in Dosage (2)  Sanjuana Kava, PMHNP, FNP-BC 07/27/2014, 1:09 PM

## 2014-07-27 NOTE — Plan of Care (Signed)
Problem: Diagnosis: Increased Risk For Suicide Attempt Goal: STG-Patient Will Attend All Groups On The Unit Outcome: Progressing Pt reports decrease anxiety and depressive symptoms. Pt attended evening AA this evening.

## 2014-07-28 DIAGNOSIS — T1491 Suicide attempt: Secondary | ICD-10-CM

## 2014-07-28 DIAGNOSIS — T5492XA Toxic effect of unspecified corrosive substance, intentional self-harm, initial encounter: Secondary | ICD-10-CM

## 2014-07-28 LAB — GLUCOSE, CAPILLARY
Glucose-Capillary: 129 mg/dL — ABNORMAL HIGH (ref 70–99)
Glucose-Capillary: 140 mg/dL — ABNORMAL HIGH (ref 70–99)

## 2014-07-28 MED ORDER — INSULIN NPH ISOPHANE & REGULAR (70-30) 100 UNIT/ML ~~LOC~~ SUSP: 25.0000 [IU] | Freq: Two times a day (BID) | SUBCUTANEOUS | Status: DC

## 2014-07-28 MED ORDER — MIRTAZAPINE 15 MG PO TABS: 15.0000 mg | ORAL_TABLET | Freq: Every evening | ORAL | Status: DC | PRN

## 2014-07-28 MED ORDER — BUPROPION HCL ER (XL) 300 MG PO TB24: 300.0000 mg | ORAL_TABLET | Freq: Every day | ORAL | Status: DC

## 2014-07-28 MED ORDER — LISINOPRIL 5 MG PO TABS: 5.0000 mg | ORAL_TABLET | Freq: Every day | ORAL | Status: DC

## 2014-07-28 NOTE — Discharge Summary (Signed)
Physician Discharge Summary Note  Patient:  Raymond [name] is an 29 y.o., male MRN:  [id number] DOB:  1985/04/27 Patient phone:  [phone] (home)  Patient address:   [address] [place] [address],  Total Time spent with patient: 30 minutes  Date of Admission:  07/22/2014 Date of Discharge: 07/28/2014  Reason for Admission:  Depression  Principal Problem: Severe recurrent major depression without psychotic features Discharge Diagnoses: Patient Active Problem List   Diagnosis Date Noted  . 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 Severe recurrent major depression without psychotic features [F33.2] 07/22/2014  . Suicide attempt [T14.91]     Musculoskeletal: Strength & Muscle Tone: within normal limits Gait & Station: normal Patient leans: N/A  Psychiatric Specialty Exam:  SEE SRA Physical Exam  Vitals reviewed. Psychiatric: His mood appears anxious.    Review of Systems  All other systems reviewed and are negative.   Blood pressure 150/89, pulse 76, temperature 97.6 F (36.4 C), temperature source Oral, resp. rate 20, height  (1.778 m), weight 87.544 kg (193 lb), SpO2 99 %.Body mass index is 27.69 kg/(m^2).   Past Medical History:  Past Medical History  Diagnosis Date  . Gallstones   . Diabetes mellitus without complication     Past Surgical History  Procedure Laterality Date  . Cholecystectomy     Family History: History reviewed. No pertinent family history. Social History:  History  Alcohol Use  . Yes     History  Drug Use No    History   Social History  . Marital Status: Single    Spouse Name: N/A  . Number of Children: N/A  . Years of Education: N/A   Social History Main Topics  . Smoking status: Never Smoker   . Smokeless tobacco: Never Used  . Alcohol Use: Yes  . Drug Use: No  . Sexual Activity: Yes    Birth Control/ Protection: None   Other Topics Concern  . None   Social History Narrative   Risk to  Self: Is patient at risk for suicide?: No What has been your use of drugs/alcohol within the last 12 months?: cough medication. "I take it whenver my wife and I fight-up to ." pt reports this happens "at least once or twice a week." no other substance abuse/alcohol abuse reported.  Risk to Others:   Prior Inpatient Therapy:   Prior Outpatient Therapy:    Level of Care:  OP  Hospital Course:   The initial assessment at the ED is as follows: Raymond [name] is an 29 y.o. male who presents to [facility] accompanied by mother, Arna Medici and brother, Irving Shows. Patient reports suicidal attempt by drinking bleach after argument with his wife. Patient reported gesture was "a self harming attempt and if it would have ended in suicide that would have been fine."  Patient stated he has been having ongoing issues since 29 y/o and reported multiple suicide attempts in the past. Patient identified current stressors as his wife, work and finances. Patient endorses depressive sx such as fatigue, irritability, worthlessness and guilt. Patient stated he sleeps more than 12 hours a day. Patient denies HI and AVH. Patient reported ongoing use of cough medication when he gets in arguments with wife. Patient stated he will take 16 -  tablets, sometimes daily depending on level of stress. Patient denies any other drug use. Patient Ox4. Patient presents with flat, depressed mood. Patient stated he is currently working full time as a Designer, fashion/clothing  at a restaurant. Patient currently living with his wife and 76 year old daughter. Patient reported being diabetic and taking over the counter insulin. Patient stated he currently does not see a MD for insulin.  Patient reports 5 previous hospitalizations with most recent in July 2015 at [facility]. Patient stated he last had outpatient therapy with therapist in [place] in 2009 but no current outpatient.  Mother and brother discuss concern about patient's dependence on cough  medication with him having 57 year old daughter. Patient stated he is open for outpatient tx following inpatient stabilization.   Raymond [name] was admitted for Severe recurrent major depression without psychotic features and crisis management.  He was treated discharged with the medications listed below under Medication List.  Medical problems were identified and treated as needed.  Home medications were restarted as appropriate.  Improvement was monitored by observation and Melissa Montane daily report of symptom reduction.  Emotional and mental status was monitored by daily self-inventory reports completed by Melissa Montane and clinical staff.         Raymond [name] was evaluated by the treatment team for stability and plans for continued recovery upon discharge.  Raymond [name] motivation was an integral factor for scheduling further treatment.  Employment, transportation, bed availability, health status, family support, and any pending legal issues were also considered during his hospital stay.  He was offered further treatment options upon discharge including but not limited to Residential, Intensive Outpatient, and Outpatient treatment.  Raymond [name] will follow up with the services as listed below under Follow Up Information.     Upon completion of this admission the patient was both mentally and medically stable for discharge denying suicidal/homicidal ideation, auditory/visual/tactile hallucinations, delusional thoughts and paranoia.      Consults:  psychiatry  Significant Diagnostic Studies:  labs: per ED  Discharge Vitals:   Blood pressure 150/89, pulse 76, temperature 97.6 F (36.4 C), temperature source Oral, resp. rate 20, height  (1.778 m), weight 87.544 kg (193 lb), SpO2 99 %. Body mass index is 27.69 kg/(m^2). Lab Results:   Results for orders placed or performed during the hospital encounter of 07/22/14 (from the past 72 hour(s))  Glucose, capillary      Status: Abnormal   Collection Time: 07/25/14 12:23 PM  Result Value Ref Range   Glucose-Capillary 140 (H) 70 - 99 mg/dL  Glucose, capillary     Status: Abnormal   Collection Time: 07/25/14  5:11 PM  Result Value Ref Range   Glucose-Capillary 441 (H) 70 - 99 mg/dL   Comment 1 Notify RN   Glucose, capillary     Status: Abnormal   Collection Time: 07/25/14  5:32 PM  Result Value Ref Range   Glucose-Capillary 395 (H) 70 - 99 mg/dL  Glucose, capillary     Status: Abnormal   Collection Time: 07/25/14  8:16 PM  Result Value Ref Range   Glucose-Capillary 239 (H) 70 - 99 mg/dL   Comment 1 Notify RN    Comment 2 Document in Chart   Glucose, capillary     Status: Abnormal   Collection Time: 07/26/14  6:13 AM  Result Value Ref Range   Glucose-Capillary 250 (H) 70 - 99 mg/dL   Comment 1 Notify RN    Comment 2 Document in Chart   Glucose, capillary     Status: Abnormal   Collection Time: 07/26/14 12:10 PM  Result Value Ref Range   Glucose-Capillary 159 (H)  70 - 99 mg/dL  Glucose, capillary     Status: Abnormal   Collection Time: 07/26/14  5:11 PM  Result Value Ref Range   Glucose-Capillary 199 (H) 70 - 99 mg/dL  Glucose, capillary     Status: Abnormal   Collection Time: 07/26/14  9:04 PM  Result Value Ref Range   Glucose-Capillary 134 (H) 70 - 99 mg/dL   Comment 1 Notify RN   Glucose, capillary     Status: Abnormal   Collection Time: 07/27/14  6:36 AM  Result Value Ref Range   Glucose-Capillary 194 (H) 70 - 99 mg/dL   Comment 1 Notify RN   Glucose, capillary     Status: Abnormal   Collection Time: 07/27/14 11:35 AM  Result Value Ref Range   Glucose-Capillary 240 (H) 70 - 99 mg/dL   Comment 1 Notify RN    Comment 2 Document in Chart   Glucose, capillary     Status: Abnormal   Collection Time: 07/27/14  4:55 PM  Result Value Ref Range   Glucose-Capillary 264 (H) 70 - 99 mg/dL   Comment 1 Notify RN    Comment 2 Document in Chart   Glucose, capillary     Status: Abnormal    Collection Time: 07/27/14  8:59 PM  Result Value Ref Range   Glucose-Capillary 290 (H) 70 - 99 mg/dL  Glucose, capillary     Status: Abnormal   Collection Time: 07/28/14  6:02 AM  Result Value Ref Range   Glucose-Capillary 129 (H) 70 - 99 mg/dL    Physical Findings: AIMS: Facial and Oral Movements Muscles of Facial Expression: None, normal Lips and Perioral Area: None, normal Jaw: None, normal Tongue: None, normal,Extremity Movements Upper (arms, wrists, hands, fingers): None, normal Lower (legs, knees, ankles, toes): None, normal, Trunk Movements Neck, shoulders, hips: None, normal, Overall Severity Severity of abnormal movements (highest score from questions above): None, normal Incapacitation due to abnormal movements: None, normal Patient's awareness of abnormal movements (rate only patient's report): No Awareness, Dental Status Current problems with teeth and/or dentures?: No Does patient usually wear dentures?: No  CIWA:    COWS:      See Psychiatric Specialty Exam and Suicide Risk Assessment completed by Attending Physician prior to discharge.  Discharge destination:  Home  Is patient on multiple antipsychotic therapies at discharge:  No   Has Patient had three or more failed trials of antipsychotic monotherapy by history:  No    Recommended Plan for Multiple Antipsychotic Therapies: NA     Medication List    TAKE these medications      Indication   buPROPion 300 MG 24 hr tablet  Commonly known as:  WELLBUTRIN XL  Take 1 tablet (300 mg total) by mouth daily.   Indication:  Major Depressive Disorder     insulin NPH-regular Human (70-30) 100 UNIT/ML injection  Commonly known as:  NOVOLIN 70/30  Inject 25 Units into the skin 2 (two) times daily with a meal.   Indication:  Type 2 Diabetes     lisinopril 5 MG tablet  Commonly known as:  PRINIVIL,ZESTRIL  Take 1 tablet (5 mg total) by mouth daily.   Indication:  High Blood Pressure     mirtazapine 15 MG  tablet  Commonly known as:  REMERON  Take 1 tablet (15 mg total) by mouth at bedtime as needed (sleep).   Indication:  Major Depressive Disorder           Follow-up Information  Follow up with Emelia Loron On 07/29/2014.   Why:  Appt on this date at 9:45AM for hospital follow-up/medication management/assessment for therapy services.    Contact information:   [address] [place], [place] [address] Phone: [phone] Fax: [phone]      Follow-up recommendations:  Activity:  as tol, diet as tol  Comments:  1.  Take all your medications as prescribed.              2.  Report any adverse side effects to outpatient provider.                       3.  Patient instructed to not use alcohol or illegal drugs while on prescription medicines.            4.  In the event of worsening symptoms, instructed patient to call 911, the crisis hotline or go to nearest emergency room for evaluation of symptoms.  Total Discharge Time:  30 min  Signed: Velna Hatchet May Agustin AGNP-BC 07/28/2014, 8:36 AM  Patient was seen face-to-face for this evaluation, completed discharge suicide risk assessment and discussed with physician extender and staff nurse support appropriate and safe disposition plan. Reviewed the information documented and agree with the treatment plan.  Dalylah Ramey,JANARDHAHA R. 07/28/2014 2:32 PM

## 2014-07-28 NOTE — Progress Notes (Signed)
Discharge Note:  Patient discharged home with wife.  Denied SI and HI.  Denied A/V hallucinations. Suicide prevention information given and discussed with patient who stated he understood and had no questions.  Patient stated he received all his belongings, clothing, misc items, phone, wallet, cards, shoes, jewelry, medications, prescriptions.  Patient stated [facility].

## 2014-07-28 NOTE — Progress Notes (Signed)
D:  Patient sleep good last night, no sleep medication given.  Good appetite, normal energy level.  Rated depression, hopeless and anxiety #5.  Denied withdrawals.  Denied physical problems.  Physical pain #3, left ring finger.  Pain medication is helpful.  Goal is "going home".  Plans to leave here.  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  Does have discharge plans. A:  Medications administered per MD orders.  Emotional support and encouragement given patient. R:  Denied SI and HI, contracts for safety.  Denied A/V hallucinations.  Safety maintained with 15 minute checks.

## 2014-07-28 NOTE — Progress Notes (Signed)
Security guard told nurse that this patient and his wife left facility and began arguing in parking lot.  Patient talked to security guard [facility].  [facility]  Charge nurse and RN informed of patient and his wife's actions per security guard's information.

## 2014-07-28 NOTE — [facility] Suicide Risk Assessment (Signed)
[facility]   Demographic Factors:  Male, Adolescent or young adult, Caucasian, Low socioeconomic status and Unemployed  Total Time spent with patient: 30 minutes  Musculoskeletal: Strength & Muscle Tone: within normal limits Gait & Station: normal Patient leans: N/A  Psychiatric Specialty Exam: Physical Exam  ROS  Blood pressure 150/89, pulse 76, temperature 97.6 F (36.4 C), temperature source Oral, resp. rate 20, height 5\' 10"  (1.778 m), weight 87.544 kg (193 lb), SpO2 99 %.Body mass index is 27.69 kg/(m^2).  General Appearance: Casual  Eye Contact::  Good  Speech:  Clear and Coherent409  Volume:  Normal  Mood:  Euthymic  Affect:  Appropriate and Congruent  Thought Process:  Coherent and Goal Directed  Orientation:  Full (Time, Place, and Person)  Thought Content:  WDL  Suicidal Thoughts:  No  Homicidal Thoughts:  No  Memory:  Immediate;   Good Recent;   Good Remote;   Good  Judgement:  Intact  Insight:  Good  Psychomotor Activity:  Normal  Concentration:  Good  Recall:  Good  Fund of Knowledge:Good  Language: Good  Akathisia:  Negative  Handed:  Right  AIMS (if indicated):     Assets:  Communication Skills Desire for Improvement Financial Resources/Insurance Housing Intimacy Leisure Time Physical Health Resilience Social Support Talents/Skills Transportation Vocational/Educational  Sleep:  Number of Hours: 6.25  Cognition: WNL  ADL's:  Intact   Have you used any form of tobacco in the last 30 days? (Cigarettes, Smokeless Tobacco, Cigars, and/or Pipes): No  Has this patient used any form of tobacco in the last 30 days? (Cigarettes, Smokeless Tobacco, Cigars, and/or Pipes) Yes, A prescription for an FDA-approved tobacco cessation medication was offered at discharge and the patient refused  Mental Status Per Nursing Assessment::   On Admission:     Current Mental Status by Physician: Patient is calm and cooperative. Patient  stated that he has good communication with his wife and resolved differences. Patient feels with a good mood and bright affect. Patient denied suicidal, homicidal ideation. Patient has no evidence of psychosis.  Loss Factors: Relationship problems and disagreement with wife  Historical Factors: Prior suicide attempts, Family history of suicide, Family history of mental illness or substance abuse and Impulsivity  Risk Reduction Factors:   Responsible for children under 29 years of age, Sense of responsibility to family, Religious beliefs about death, Living with another person, especially a relative, Positive social support, Positive therapeutic relationship and Positive coping skills or problem solving skills  Continued Clinical Symptoms:  Depression:   Recent sense of peace/wellbeing Alcohol/Substance Abuse/Dependencies Unstable or Poor Therapeutic Relationship Previous Psychiatric Diagnoses and Treatments  Cognitive Features That Contribute To Risk:  Polarized thinking    Suicide Risk:  Minimal: No identifiable suicidal ideation.  Patients presenting with no risk factors but with morbid ruminations; may be classified as minimal risk based on the severity of the depressive symptoms  Principal Problem: Severe recurrent major depression without psychotic features Discharge Diagnoses:  Patient Active Problem List   Diagnosis Date Noted  . 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 Severe recurrent major depression without psychotic features [F33.2] 07/22/2014  . Suicide attempt [T14.91]     Follow-up Information    Follow up with Emelia Loronaymark Crook On 07/29/2014.   Why:  Appt on this date at 9:45AM for hospital follow-up/medication management/assessment for therapy services.    Contact information:   [address] [place], [place] [address] Phone: [phone] Fax: [phone]  Plan Of Care/Follow-up recommendations:  Activity:  As tolerated Diet:  Regular  Is patient  on multiple antipsychotic therapies at discharge:  No   Has Patient had three or more failed trials of antipsychotic monotherapy by history:  No  Recommended Plan for Multiple Antipsychotic Therapies: NA    Allyn Bertoni,JANARDHAHA R. 07/28/2014, 11:41 AM

## 2014-07-28 NOTE — [facility] Group Notes (Signed)
The focus of this group is to educate the patient on the purpose and policies of crisis stabilization and provide a format to answer questions about their admission.  The group details unit policies and expectations of patients while admitted.  Patient attended 0900 nurse education orientation group this morning.  Patient actively participated, appropriate affect, alert, appropriate insight and engagement.  Today patient will work on 3 goals for discharge.  

## 2014-07-28 NOTE — [facility] Group Notes (Signed)
[facility] Group Notes:  (Clinical Social Work)  07/28/2014  10:00-11:00AM  Summary of Progress/Problems:   The main focus of today's process group was to   1)  discuss the importance of adding supports  2)  define health supports versus unhealthy supports  3)  identify the patient's current unhealthy supports and plan how to handle them  4)  Identify the patient's current healthy supports and plan what to add.  An emphasis was placed on using counselor, doctor, therapy groups, 12-step groups, and problem-specific support groups to expand supports.    The patient expressed full comprehension of the concepts presented, and agreed that there is a need to add more supports.  The patient stated his daughter and outdoors activities are his healthy supports, while his toxic relationship with his wife and with himself are his unhealthy ones.  Type of Therapy:  Process Group with Motivational Interviewing  Participation Level:  Active  Participation Quality:  Attentive, Sharing and Supportive  Affect:  Blunted  Cognitive:  Alert  Insight:  Engaged  Engagement in Therapy:  Engaged  Modes of Intervention:   Education, Teacher, English as a foreign languageupport and Processing, Activity  Ambrose MantleMareida Grossman-Orr, LCSW 07/28/2014

## 2014-07-28 NOTE — Progress Notes (Signed)
D: Pt has appropriate affect and mood.  Pt reports his goal tonight is to "get a good nights sleep."  Pt reports his ankles are "a little swollen."  Pt denies SI/HI, denies hallucinations.  Pt has been visible in milieu interacting with peers and staff appropriately.  Pt attended evening group.  Pt reported left index finger pain of 5/10 because he "jammed it."   A: Introduced self to pt.  Met with pt 1:1 and provided support and encouragement.  Actively listened to pt.  Encouraged pt to elevate his feet.  PRN medication administered for pain.  Cold pack applied for pain.   R: Pt reports he will notify staff of needs and concerns.  Pt verbally contracts for safety.  Will continue to monitor and assess.   

## 2014-08-17 ENCOUNTER — Emergency Department (HOSPITAL_COMMUNITY)
Admission: EM | Admit: 2014-08-17 | Discharge: 2014-08-17 | Disposition: A | Discharge status: Home / Self Care | Payer: Medicaid Other | Attending: Emergency Medicine | Admitting: Emergency Medicine

## 2014-08-17 ENCOUNTER — Emergency Department (HOSPITAL_COMMUNITY): Payer: Medicaid Other

## 2014-08-17 ENCOUNTER — Encounter (HOSPITAL_COMMUNITY): Payer: Self-pay | Admitting: *Deleted

## 2014-08-17 DIAGNOSIS — Y9361 Activity, american tackle football: Secondary | ICD-10-CM | POA: Insufficient documentation

## 2014-08-17 DIAGNOSIS — Z794 Long term (current) use of insulin: Secondary | ICD-10-CM | POA: Diagnosis not present

## 2014-08-17 DIAGNOSIS — Y998 Other external cause status: Secondary | ICD-10-CM | POA: Diagnosis not present

## 2014-08-17 DIAGNOSIS — W2101XA Struck by football, initial encounter: Secondary | ICD-10-CM | POA: Insufficient documentation

## 2014-08-17 DIAGNOSIS — S63619A Unspecified sprain of unspecified finger, initial encounter: Secondary | ICD-10-CM

## 2014-08-17 DIAGNOSIS — Y9289 Other specified places as the place of occurrence of the external cause: Secondary | ICD-10-CM | POA: Diagnosis not present

## 2014-08-17 DIAGNOSIS — E109 Type 1 diabetes mellitus without complications: Secondary | ICD-10-CM | POA: Diagnosis not present

## 2014-08-17 DIAGNOSIS — Z8719 Personal history of other diseases of the digestive system: Secondary | ICD-10-CM | POA: Diagnosis not present

## 2014-08-17 DIAGNOSIS — Z79899 Other long term (current) drug therapy: Secondary | ICD-10-CM | POA: Insufficient documentation

## 2014-08-17 DIAGNOSIS — S6992XA Unspecified injury of left wrist, hand and finger(s), initial encounter: Secondary | ICD-10-CM | POA: Diagnosis present

## 2014-08-17 DIAGNOSIS — S63615A Unspecified sprain of left ring finger, initial encounter: Secondary | ICD-10-CM | POA: Diagnosis not present

## 2014-08-17 NOTE — ED Provider Notes (Signed)
CSN: [id number]     Arrival date & time 08/17/14  1614 History  This chart was scribed for Raymond MaskerKaren Sofia, PA-C working with Blane OharaJoshua Zavitz, MD by Elveria Risingimelie Horne, ED Scribe. This patient was seen in room WTR6/WTR6 and the patient's care was started at 5:31 PM.   Chief Complaint  Patient presents with  . Finger Injury    left ring finger   The history is provided by the patient. No language interpreter was used.   HPI Comments: Raymond MontaneVictor A [name] is a 29 y.o. male who presents to the Emergency Department with left third finger injury incurred two weeks ago when attempting to catch a football; impact to distal apsect. Injury initially teated with Tylenol and ice. [facility]'s [facility]; imaging performed revealed no fractures. Patient reports pain and swelling to PIP joint that has not improved since his injury. Patient reports pain with movement and states that this interferes with his ability to work. Patient shares history of Type I Diabetes; he suspected his condition was delaying his healing. Patient is without insurance.   Past Medical History  Diagnosis Date  . Gallstones   . Diabetes mellitus without complication     Type 1   Past Surgical History  Procedure Laterality Date  . Cholecystectomy     No family history on file. History  Substance Use Topics  . Smoking status: Never Smoker   . Smokeless tobacco: Current User    Types: Chew  . Alcohol Use: 0.6 - 1.2 oz/week    1-2 Cans of beer per week     Comment: once a month    Review of Systems  Constitutional: Negative for fever.  Musculoskeletal: Positive for joint swelling and arthralgias.  Skin: Negative for wound.  All other systems reviewed and are negative.     Allergies  Tramadol and Ibuprofen  Home Medications   Prior to Admission medications   Medication Sig Start Date End Date Taking? Authorizing Provider  buPROPion (WELLBUTRIN XL) 300 MG 24 hr tablet Take 1  tablet (300 mg total) by mouth daily. 07/28/14   Adonis BrookSheila Agustin, NP  insulin NPH-regular Human (NOVOLIN 70/30) (70-30) 100 UNIT/ML injection Inject 25 Units into the skin 2 (two) times daily with a meal. 07/28/14   Adonis BrookSheila Agustin, NP  lisinopril (PRINIVIL,ZESTRIL) 5 MG tablet Take 1 tablet (5 mg total) by mouth daily. 07/28/14   Adonis BrookSheila Agustin, NP  mirtazapine (REMERON) 15 MG tablet Take 1 tablet (15 mg total) by mouth at bedtime as needed (sleep). 07/28/14   Adonis BrookSheila Agustin, NP   Triage Vitals: BP 148/90 mmHg  Pulse 84  Temp(Src) 98.4 F (36.9 C) (Oral)  Resp 16  SpO2 96% Physical Exam  Constitutional: He is oriented to person, place, and time. He appears well-developed and well-nourished. No distress.  HENT:  Head: Normocephalic and atraumatic.  Eyes: EOM are normal.  Neck: Neck supple. No tracheal deviation present.  Cardiovascular: Normal rate.   Pulmonary/Chest: Effort normal. No respiratory distress.  Musculoskeletal: Normal range of motion.  Neurological: He is alert and oriented to person, place, and time.  Skin: Skin is warm and dry.  Psychiatric: He has a normal mood and affect. His behavior is normal.  Nursing note and vitals reviewed.   ED Course  Procedures (including critical care time)  COORDINATION OF CARE: 5:37 PM- Plans to apply splint and refer to orthopedist. Discussed treatment plan with patient at bedside and patient agreed to plan.   Labs  Review Labs Reviewed - No data to display  Imaging Review No results found.   EKG Interpretation None      MDM  I counseled on probable tendon injury   Final diagnoses:  Sprain of finger, left, initial encounter    Pt advised to follow up with Hand surgeon Splint   I personally performed the services in this documentation, which was scribed in my presence.  The recorded information has been reviewed and considered.   Barnet Pall.  Lonia Skinner [place]re[place]of, PA-C 08/18/14 1038  Eber Hong, MD 08/18/14 [phone]

## 2014-08-17 NOTE — Discharge Instructions (Signed)
Finger Sprain A finger sprain is a tear in one of the strong, fibrous tissues that connect the bones (ligaments) in your finger. The severity of the sprain depends on how much of the ligament is torn. The tear can be either partial or complete. CAUSES  Often, sprains are a result of a fall or accident. If you extend your hands to catch an object or to protect yourself, the force of the impact causes the fibers of your ligament to stretch too much. This excess tension causes the fibers of your ligament to tear. SYMPTOMS  You may have some loss of motion in your finger. Other symptoms include:  Bruising.  Tenderness.  Swelling. DIAGNOSIS  In order to diagnose finger sprain, your caregiver will physically examine your finger or thumb to determine how torn the ligament is. Your caregiver may also suggest an X-ray exam of your finger to make sure no bones are broken. TREATMENT  If your ligament is only partially torn, treatment usually involves keeping the finger in a fixed position (immobilization) for a short period. To do this, your caregiver will apply a bandage, cast, or splint to keep your finger from moving until it heals. For a partially torn ligament, the healing process usually takes 2 to 3 weeks. If your ligament is completely torn, you may need surgery to reconnect the ligament to the bone. After surgery a cast or splint will be applied and will need to stay on your finger or thumb for 4 to 6 weeks while your ligament heals. HOME CARE INSTRUCTIONS  Keep your injured finger elevated, when possible, to decrease swelling.  To ease pain and swelling, apply ice to your joint twice a day, for 2 to 3 days:  Put ice in a plastic bag.  Place a towel between your skin and the bag.  Leave the ice on for 15 minutes.  Only take over-the-counter or prescription medicine for pain as directed by your caregiver.  Do not wear rings on your injured finger.  Do not leave your finger unprotected  until pain and stiffness go away (usually 3 to 4 weeks).  Do not allow your cast or splint to get wet. Cover your cast or splint with a plastic bag when you shower or bathe. Do not swim.  Your caregiver may suggest special exercises for you to do during your recovery to prevent or limit permanent stiffness. SEEK IMMEDIATE MEDICAL CARE IF:  Your cast or splint becomes damaged.  Your pain becomes worse rather than better. MAKE SURE YOU:  Understand these instructions.  Will watch your condition.  Will get help right away if you are not doing well or get worse. Document Released: 04/22/2004 Document Revised: 06/07/2011 Document Reviewed: 11/16/2010 [facility] Patient Information 2015 [place], [place]. This information is not intended to replace advice given to you by your health care provider. Make sure you discuss any questions you have with your health care provider. Tendon Injury Tendons are strong, cordlike structures that connect muscle to bone. Tendons are made up of woven fibers, like a rope. A tendon injury is a tear (rupture) of the tendon. The rupture may be partial (only a few of the fibers in your tendon rupture) or complete (your entire tendon ruptures). CAUSES  Tendon injuries can be caused by high-stress activities, such as sports. They also can be caused by a repetitive injury or by a single injury from an excessive, rapid force. SYMPTOMS  Symptoms of tendon injury include pain when you move the joint  close to the tendon. Other symptoms are swelling, redness, and warmth. DIAGNOSIS  Tendon injuries often can be diagnosed by physical exam. However, sometimes an X-ray exam or advanced imaging, such as magnetic resonance imaging (MRI), is necessary to determine the extent of the injury. TREATMENT  Partial tendon ruptures often can be treated with immobilization. A splint, bandage, or removable brace usually is used to immobilize the injured tendon. Most injured tendons need to be  immobilized for 1-2 months before they are completely healed. Complete tendon ruptures may require surgical reattachment. Document Released: 04/22/2004 Document Revised: 03/04/2011 Document Reviewed: 06/06/2011 [facility] Patient Information 2015 [place]. This information is not intended to replace advice given to you by your health care provider. Make sure you discuss any questions you have with your health care provider.

## 2014-08-17 NOTE — ED Notes (Signed)
Patient states 2 weeks ago he "jammed" his left ring finger while playing football.  Patient's proximal phalangeal joint is grossly swollen and he is unable to flex finger.  [facility] and an xray was performed.  Patient was informed that his finger was not fractured.  Patient states the finger is so painful it interferes with his ability to work.  Patient denies numbness and tingling in left hand.  Skin is warm and dry.

## 2016-12-27 ENCOUNTER — Inpatient Hospital Stay (HOSPITAL_COMMUNITY)
Admission: AD | Admit: 2016-12-27 | Discharge: 2017-01-03 | DRG: 885 | Disposition: A | Discharge status: Home / Self Care | Payer: Federal, State, Local not specified - Other | Source: Other Acute Inpatient Hospital | Attending: Psychiatry | Admitting: Psychiatry

## 2016-12-27 ENCOUNTER — Encounter (HOSPITAL_COMMUNITY): Payer: Self-pay | Admitting: *Deleted

## 2016-12-27 DIAGNOSIS — F39 Unspecified mood [affective] disorder: Secondary | ICD-10-CM | POA: Diagnosis not present

## 2016-12-27 DIAGNOSIS — T1491XA Suicide attempt, initial encounter: Secondary | ICD-10-CM | POA: Diagnosis not present

## 2016-12-27 DIAGNOSIS — Z886 Allergy status to analgesic agent status: Secondary | ICD-10-CM | POA: Diagnosis not present

## 2016-12-27 DIAGNOSIS — F332 Major depressive disorder, recurrent severe without psychotic features: Secondary | ICD-10-CM | POA: Diagnosis not present

## 2016-12-27 DIAGNOSIS — Z23 Encounter for immunization: Secondary | ICD-10-CM

## 2016-12-27 DIAGNOSIS — E1065 Type 1 diabetes mellitus with hyperglycemia: Secondary | ICD-10-CM | POA: Diagnosis present

## 2016-12-27 DIAGNOSIS — Z915 Personal history of self-harm: Secondary | ICD-10-CM

## 2016-12-27 DIAGNOSIS — T450X2A Poisoning by antiallergic and antiemetic drugs, intentional self-harm, initial encounter: Secondary | ICD-10-CM | POA: Diagnosis not present

## 2016-12-27 DIAGNOSIS — F419 Anxiety disorder, unspecified: Secondary | ICD-10-CM | POA: Diagnosis present

## 2016-12-27 DIAGNOSIS — Z79899 Other long term (current) drug therapy: Secondary | ICD-10-CM | POA: Diagnosis not present

## 2016-12-27 DIAGNOSIS — Z794 Long term (current) use of insulin: Secondary | ICD-10-CM | POA: Diagnosis not present

## 2016-12-27 DIAGNOSIS — G4700 Insomnia, unspecified: Secondary | ICD-10-CM | POA: Diagnosis not present

## 2016-12-27 DIAGNOSIS — Z885 Allergy status to narcotic agent status: Secondary | ICD-10-CM

## 2016-12-27 DIAGNOSIS — I10 Essential (primary) hypertension: Secondary | ICD-10-CM | POA: Diagnosis present

## 2016-12-27 DIAGNOSIS — R4583 Excessive crying of child, adolescent or adult: Secondary | ICD-10-CM | POA: Diagnosis not present

## 2016-12-27 DIAGNOSIS — F17220 Nicotine dependence, chewing tobacco, uncomplicated: Secondary | ICD-10-CM | POA: Diagnosis not present

## 2016-12-27 DIAGNOSIS — G4710 Hypersomnia, unspecified: Secondary | ICD-10-CM | POA: Diagnosis present

## 2016-12-27 DIAGNOSIS — T43596A Underdosing of other antipsychotics and neuroleptics, initial encounter: Secondary | ICD-10-CM | POA: Diagnosis present

## 2016-12-27 DIAGNOSIS — X58XXXA Exposure to other specified factors, initial encounter: Secondary | ICD-10-CM | POA: Diagnosis present

## 2016-12-27 DIAGNOSIS — Z91120 Patient's intentional underdosing of medication regimen due to financial hardship: Secondary | ICD-10-CM

## 2016-12-27 LAB — GLUCOSE, CAPILLARY: Glucose-Capillary: 184 mg/dL — ABNORMAL HIGH (ref 65–99)

## 2016-12-27 MED ORDER — ACETAMINOPHEN 325 MG PO TABS
650.0000 mg | ORAL_TABLET | Freq: Four times a day (QID) | ORAL | Status: DC | PRN
  Administered 2016-12-27 – 2016-12-29 (×2): 650 mg via ORAL
  Filled 2016-12-27 (×2): qty 2

## 2016-12-27 MED ORDER — TRAZODONE HCL 50 MG PO TABS
50.0000 mg | ORAL_TABLET | Freq: Every evening | ORAL | Status: DC | PRN
  Administered 2016-12-27 – 2017-01-02 (×6): 50 mg via ORAL
  Filled 2016-12-27: qty 7
  Filled 2016-12-27 (×4): qty 1

## 2016-12-27 MED ORDER — INSULIN ASPART 100 UNIT/ML ~~LOC~~ SOLN
0.0000 [IU] | Freq: Every day | SUBCUTANEOUS | Status: DC
  Administered 2016-12-28: 4 [IU] via SUBCUTANEOUS
  Administered 2016-12-30: 5 [IU] via SUBCUTANEOUS
  Administered 2016-12-31 – 2017-01-01 (×2): 3 [IU] via SUBCUTANEOUS

## 2016-12-27 MED ORDER — MAGNESIUM HYDROXIDE 400 MG/5ML PO SUSP: 30.0000 mL | Freq: Every day | ORAL | Status: DC | PRN

## 2016-12-27 MED ORDER — INSULIN ASPART 100 UNIT/ML ~~LOC~~ SOLN
0.0000 [IU] | Freq: Three times a day (TID) | SUBCUTANEOUS | Status: DC
Start: 2016-12-28 — End: 2016-12-29
  Administered 2016-12-28 (×2): 15 [IU] via SUBCUTANEOUS
  Administered 2016-12-28: 5 [IU] via SUBCUTANEOUS
  Administered 2016-12-29 (×3): 15 [IU] via SUBCUTANEOUS

## 2016-12-27 MED ORDER — INFLUENZA VAC SPLIT QUAD 0.5 ML IM SUSY
0.5000 mL | PREFILLED_SYRINGE | INTRAMUSCULAR | Status: AC
  Administered 2016-12-28: 0.5 mL via INTRAMUSCULAR
  Filled 2016-12-27: qty 0.5

## 2016-12-27 MED ORDER — ALUM & MAG HYDROXIDE-SIMETH 200-200-20 MG/5ML PO SUSP
30.0000 mL | ORAL | Status: DC | PRN
  Administered 2017-01-01 – 2017-01-03 (×2): 30 mL via ORAL
  Filled 2016-12-27 (×2): qty 30

## 2016-12-27 MED ORDER — PNEUMOCOCCAL VAC POLYVALENT 25 MCG/0.5ML IJ INJ
0.5000 mL | INJECTION | INTRAMUSCULAR | Status: AC
  Administered 2016-12-28: 0.5 mL via INTRAMUSCULAR
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
## 2016-12-27 NOTE — Tx Team (Signed)
Initial Treatment Plan 12/27/2016 11:55 PM [name]:[id number]    PATIENT STRESSORS: Marital or family conflict Medication change or noncompliance Substance abuse   PATIENT STRENGTHS: Ability for insight Average or above average intelligence Communication skills General fund of knowledge Motivation for treatment/growth   PATIENT IDENTIFIED PROBLEMS: Depression Suicidal thoughts Substance abuse "Coping mechanisms, honestly, that's the main thing I need to focus on" "I have a problem with over the counter cough medicine"                     DISCHARGE CRITERIA:  Ability to meet basic life and health needs Improved stabilization in mood, thinking, and/or behavior Verbal commitment to aftercare and medication compliance  PRELIMINARY DISCHARGE PLAN: Attend aftercare/continuing care group Return to previous living arrangement  PATIENT/FAMILY INVOLVEMENT: This treatment plan has been presented to and reviewed with the patient, Raymond [name], and/or family member, .  The patient and family have been given the opportunity to ask questions and make suggestions.  Kimbley Sprague, [place], [place] 12/27/2016, 11:55 PM

## 2016-12-27 NOTE — Progress Notes (Signed)
Raymond [name] is a 31 year old male pt admitted on voluntary basis from [facility]. He does endorse that he attempted suicide by overdose and reports that he has been constantly thinking about "Should I do it, Shouldn't I do it" and reports still having these thoughts on admission but is able to contract for safety while in the hospital. He reports that he lives alone in an apartment in [place] and will return there after discharge. He does report that he has had substance abuse issues and reports that currently his issues are with over the counter cough medicine. He reports that he had gone to [facility] in the past but reports that he has not been back in awhile. He reports that the only medication he takes is 70/30 insulin that he gets over the counter from [facility] and reports that he doses himself when he feels he needs to. He reports he wants to work on coping mechanisms while here as well as getting outpatient resources for after discharge. Raymond [name] was cooperative during admission process, he was oriented to the unit and safety maintained.

## 2016-12-28 DIAGNOSIS — R454 Irritability and anger: Secondary | ICD-10-CM

## 2016-12-28 DIAGNOSIS — F4010 Social phobia, unspecified: Secondary | ICD-10-CM

## 2016-12-28 DIAGNOSIS — T450X2A Poisoning by antiallergic and antiemetic drugs, intentional self-harm, initial encounter: Secondary | ICD-10-CM

## 2016-12-28 DIAGNOSIS — Z599 Problem related to housing and economic circumstances, unspecified: Secondary | ICD-10-CM

## 2016-12-28 DIAGNOSIS — R5383 Other fatigue: Secondary | ICD-10-CM

## 2016-12-28 DIAGNOSIS — I10 Essential (primary) hypertension: Secondary | ICD-10-CM

## 2016-12-28 DIAGNOSIS — F39 Unspecified mood [affective] disorder: Secondary | ICD-10-CM

## 2016-12-28 DIAGNOSIS — R4583 Excessive crying of child, adolescent or adult: Secondary | ICD-10-CM

## 2016-12-28 DIAGNOSIS — F332 Major depressive disorder, recurrent severe without psychotic features: Principal | ICD-10-CM

## 2016-12-28 DIAGNOSIS — G4700 Insomnia, unspecified: Secondary | ICD-10-CM

## 2016-12-28 DIAGNOSIS — T1491XA Suicide attempt, initial encounter: Secondary | ICD-10-CM

## 2016-12-28 DIAGNOSIS — G4710 Hypersomnia, unspecified: Secondary | ICD-10-CM

## 2016-12-28 DIAGNOSIS — F17210 Nicotine dependence, cigarettes, uncomplicated: Secondary | ICD-10-CM

## 2016-12-28 LAB — GLUCOSE, CAPILLARY
Glucose-Capillary: 213 mg/dL — ABNORMAL HIGH (ref 65–99)
Glucose-Capillary: 387 mg/dL — ABNORMAL HIGH (ref 65–99)
Glucose-Capillary: 387 mg/dL — ABNORMAL HIGH (ref 65–99)
Glucose-Capillary: 301 mg/dL — ABNORMAL HIGH (ref 65–99)

## 2016-12-28 MED ORDER — LISINOPRIL 5 MG PO TABS
5.0000 mg | ORAL_TABLET | Freq: Every day | ORAL | Status: DC
  Administered 2016-12-28 – 2017-01-03 (×7): 5 mg via ORAL
  Filled 2016-12-28 (×9): qty 1
  Filled 2016-12-28: qty 7
  Filled 2016-12-28: qty 1

## 2016-12-28 MED ORDER — ADULT MULTIVITAMIN W/MINERALS CH
1.0000 | ORAL_TABLET | Freq: Every day | ORAL | Status: DC
  Administered 2016-12-28 – 2017-01-03 (×7): 1 via ORAL
  Filled 2016-12-28 (×12): qty 1

## 2016-12-28 MED ORDER — ARIPIPRAZOLE 2 MG PO TABS
2.0000 mg | ORAL_TABLET | Freq: Every day | ORAL | Status: DC
  Administered 2016-12-28 – 2016-12-29 (×2): 2 mg via ORAL
  Filled 2016-12-28 (×4): qty 1

## 2016-12-28 MED ORDER — BUPROPION HCL ER (XL) 150 MG PO TB24
150.0000 mg | ORAL_TABLET | Freq: Every day | ORAL | Status: DC
  Administered 2016-12-28: 150 mg via ORAL
  Filled 2016-12-28 (×4): qty 1

## 2016-12-28 MED ORDER — LOPERAMIDE HCL 2 MG PO CAPS: 2.0000 mg | ORAL_CAPSULE | ORAL | Status: AC | PRN

## 2016-12-28 MED ORDER — CHLORDIAZEPOXIDE HCL 25 MG PO CAPS: 25.0000 mg | ORAL_CAPSULE | Freq: Four times a day (QID) | ORAL | Status: AC | PRN

## 2016-12-28 MED ORDER — HYDROXYZINE HCL 25 MG PO TABS: 25.0000 mg | ORAL_TABLET | Freq: Four times a day (QID) | ORAL | Status: AC | PRN

## 2016-12-28 MED ORDER — THIAMINE HCL 100 MG/ML IJ SOLN
100.0000 mg | Freq: Once | INTRAMUSCULAR | Status: DC
Start: 2016-12-28 — End: 2016-12-28

## 2016-12-28 MED ORDER — VITAMIN B-1 100 MG PO TABS
100.0000 mg | ORAL_TABLET | Freq: Every day | ORAL | Status: DC
  Administered 2016-12-29 – 2017-01-03 (×6): 100 mg via ORAL
  Filled 2016-12-28 (×10): qty 1

## 2016-12-28 MED ORDER — ONDANSETRON 4 MG PO TBDP: 4.0000 mg | ORAL_TABLET | Freq: Four times a day (QID) | ORAL | Status: AC | PRN

## 2016-12-28 MED ORDER — FLUOXETINE HCL 20 MG PO CAPS
20.0000 mg | ORAL_CAPSULE | Freq: Every day | ORAL | Status: DC
  Administered 2016-12-29 – 2017-01-03 (×6): 20 mg via ORAL
  Filled 2016-12-28 (×2): qty 1
  Filled 2016-12-28: qty 7
  Filled 2016-12-28 (×7): qty 1

## 2016-12-28 NOTE — Plan of Care (Signed)
Problem: Education: Goal: Knowledge of [facility] Education information/materials will improve Outcome: Progressing Patient verbalizes understanding of admission information provided. Patient denies questions and agrees to plan of care.  Problem: Safety: Goal: Periods of time without injury will increase Outcome: Progressing Patient is on q15 minute safety checks and low fall risk precautions. Patient contracts for safety on the unit and remains safe at this time.

## 2016-12-28 NOTE — H&P (Signed)
Psychiatric Admission Assessment Adult  Patient Identification: Raymond [name] MRN:  [id number] Date of Evaluation:  12/28/2016 Chief Complaint:  MDD severe  Principal Diagnosis: MDD (major depressive disorder), recurrent severe, without psychosis ([place]) Diagnosis:   Patient Active Problem List   Diagnosis Date Noted  . MDD (major depressive disorder), recurrent severe, without psychosis ([place]) [F33.2] 12/27/2016  . 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 Severe recurrent major depression without psychotic features ([place]) [F33.2] 07/22/2014  . Suicide attempt ([place]) [T14.91XA]    History of Present Illness:  BHH Assessment Note: 31 y.o. male presenting from [facility] after he took 41 tablet of chlorpheniramine and dextromethorphan and drank half a bottle of wine in an SI attempt. The patient reports a long history of depression but states recent stressors involved a separation from his wife. Also reports, he had people in his house that were using intravenous drugs and his 62 year old daughter stepped on a syringe. Pt says, "I'm a failure as a father." Patient reports he has attempted SI several times in the past by OD, ingesting bleach, cutting his wrist, and not taking insulin. Denies Self injurious behavior. Patient reports symptoms of depression including crying episodes, social withdrawal, decreased concentration, fatigue, decreased motivation, irritability and feeling hopeless. Denies HI. Denies A/V. History of aggression in the past. Drinks 6-7 cans of beer a day. Past history of multiple drug use, including amphetamines, methamphetamines, cocaine and cannabis.   Admit Assessment Note: 31 year old male pt admitted on voluntary basis from [facility]. He does endorse that he attempted suicide by overdose and reports that he has been constantly thinking about "Should I do it, Shouldn't I do it" and reports still having these thoughts on admission but is able to contract  for safety while in the hospital. He reports that he lives alone in an apartment in [place] and will return there after discharge. He does report that he has had substance abuse issues and reports that currently his issues are with over the counter cough medicine. He reports that he had gone to [facility] in the past but reports that he has not been back in awhile. He reports that the only medication he takes is 70/30 insulin that he gets over the counter from [facility] and reports that he doses himself when he feels he needs to. He reports he wants to work on coping mechanisms while here as well as getting outpatient resources for after discharge.  Patient is seen today and reports that the trigger for this suicide attempt was that his young daughter was at his apartment and stepped on a used broken syringe needle that one of the patient's friend left in the house. The daughter was taken to the ER by the mother and the patient reports that the daughter is okay. However, that was a very depressing event for the patient. The patient reports that he was going to [facility] in [place] but stopped going there and stopped his previous medications due to financial issues. He reports being on numerous medications and only recalls some of them, but reports that Abilify and Wellbutrin worked the best in the past and that he would prefer NOT to have Seroquel because he has a history of abusing it. He denies any current SI/HI/AVH, but admits to recent suicide attempt and he abuses OTC cough syrup regularly. He also requests Lisinopril to be restarted for HTN and that it helped with his kidneys as well because of his DM II.  Associated Signs/Symptoms: Depression Symptoms:  depressed mood, hypersomnia, feelings of worthlessness/guilt, hopelessness, suicidal thoughts with specific plan, suicidal attempt, (Hypo) Manic Symptoms:  Denies Anxiety Symptoms:  Social Anxiety, Psychotic Symptoms:  Denies PTSD  Symptoms: NA Total Time spent with patient: 45 minutes  Past Psychiatric History: MDD, numerous suicide attempts, At least 8 [facility] admissions  Is the patient at risk to self? Yes.    Has the patient been a risk to self in the past 6 months? Yes.    Has the patient been a risk to self within the distant past? Yes.    Is the patient a risk to others? No.  Has the patient been a risk to others in the past 6 months? No.  Has the patient been a risk to others within the distant past? No.   Prior Inpatient Therapy: Prior Inpatient Therapy: Yes Prior Therapy Dates: x5 bet 2006-2016 Prior Therapy Facilty/Provider(s): [facility] Reason for Treatment: depression, SI Prior Outpatient Therapy: Prior Outpatient Therapy: Yes Prior Therapy Dates: unknown  Prior Therapy Facilty/Provider(s): [facility] Reason for Treatment: depression Does patient have an ACCT team?: No Does patient have Intensive In-House Services?  : No Does patient have [facility] services? : No Does patient have P4CC services?: No  Alcohol Screening: 1. How often do you have a drink containing alcohol?: 4 or more times a week 2. How many drinks containing alcohol do you have on a typical day when you are drinking?: 5 or 6 3. How often do you have six or more drinks on one occasion?: Daily or almost daily Preliminary Score: 6 4. How often during the last year have you found that you were not able to stop drinking once you had started?: Never 5. How often during the last year have you failed to do what was normally expected from you becasue of drinking?: Monthly 6. How often during the last year have you needed a first drink in the morning to get yourself going after a heavy drinking session?: Never 7. How often during the last year have you had a feeling of guilt of remorse after drinking?: Less than monthly 8. How often during the last year have you been unable to remember what happened the night before because you had been  drinking?: Never 9. Have you or someone else been injured as a result of your drinking?: Yes, but not in the last year 10. Has a relative or friend or a doctor or another health worker been concerned about your drinking or suggested you cut down?: Yes, during the last year Alcohol Use Disorder Identification Test Final Score (AUDIT): 19 Brief Intervention: Yes Substance Abuse History in the last 12 months:  Yes.   Consequences of Substance Abuse: Medical Consequences:  reviewed Family Consequences:  reviewed Previous Psychotropic Medications: Yes - Seroquel, Tegretol, Prozac, Wellbutrin, Abilify, Remeron, Effexor, Cymbalta Psychological Evaluations: Yes  Past Medical History:  Past Medical History:  Diagnosis Date  . Diabetes mellitus without complication (HCC)    Type 1  . Gallstones     Past Surgical History:  Procedure Laterality Date  . CHOLECYSTECTOMY     Family History: History reviewed. No pertinent family history. Family Psychiatric  History: Mom- Bipolar Tobacco Screening: Have you used any form of tobacco in the last 30 days? (Cigarettes, Smokeless Tobacco, Cigars, and/or Pipes): Yes Tobacco use, Select all that apply: smokeless tobacco use daily (reports he uses "dip") Are you interested in Tobacco Cessation Medications?: No, patient refused Counseled patient on smoking cessation  including recognizing danger situations, developing coping skills and basic information about quitting provided: Refused/Declined practical counseling Social History:  History  Alcohol Use  . 0.6 - 1.2 oz/week  . 1 - 2 Cans of beer per week    Comment: few times a week     History  Drug Use  . Types: Other-see comments    Comment: OTC cough medicine    Additional Social History: Marital status: Separated    Pain Medications: see MAR Prescriptions: see MAR Over the Counter: see MAR History of alcohol / drug use?: Yes Name of Substance 1: alcohol 1 - Age of First Use: UTA 1 - Amount  (size/oz): 6-7 cans of beer 1 - Frequency: daily 1 - Duration: UTA 1 - Last Use / Amount: UTA     Allergies:   Allergies  Allergen Reactions  . Tramadol Other (See Comments)    Hypoglycemia Shock  . Ibuprofen Swelling   Lab Results:  Results for orders placed or performed during the hospital encounter of 12/27/16 (from the past 48 hour(s))  Glucose, capillary     Status: Abnormal   Collection Time: 12/27/16 10:56 PM  Result Value Ref Range   Glucose-Capillary 184 (H) 65 - 99 mg/dL  Glucose, capillary     Status: Abnormal   Collection Time: 12/28/16  6:00 AM  Result Value Ref Range   Glucose-Capillary 213 (H) 65 - 99 mg/dL  Glucose, capillary     Status: Abnormal   Collection Time: 12/28/16 12:00 PM  Result Value Ref Range   Glucose-Capillary 387 (H) 65 - 99 mg/dL    Blood Alcohol level:  Lab Results  Component Value Date   ETH <5 66/29/4765    Metabolic Disorder Labs:  No results found for: HGBA1C, MPG No results found for: PROLACTIN No results found for: CHOL, TRIG, HDL, CHOLHDL, VLDL, LDLCALC  Current Medications: Current Facility-Administered Medications  Medication Dose Route Frequency Provider Last Rate Last Dose  . acetaminophen (TYLENOL) tablet 650 mg  650 mg Oral Q6H PRN Niel Hummer, NP   650 mg at 12/27/16 2339  . alum & mag hydroxide-simeth (MAALOX/MYLANTA) 200-200-20 MG/5ML suspension 30 mL  30 mL Oral Q4H PRN Elmarie Shiley A, NP      . ARIPiprazole (ABILIFY) tablet 2 mg  2 mg Oral Daily Money, Lowry Ram, FNP   2 mg at 12/28/16 1157  . buPROPion (WELLBUTRIN XL) 24 hr tablet 150 mg  150 mg Oral Daily Money, Lowry Ram, FNP   150 mg at 12/28/16 1157  . insulin aspart (novoLOG) injection 0-15 Units  0-15 Units Subcutaneous TID WC Niel Hummer, NP   15 Units at 12/28/16 1207  . insulin aspart (novoLOG) injection 0-5 Units  0-5 Units Subcutaneous QHS Elmarie Shiley A, NP      . lisinopril (PRINIVIL,ZESTRIL) tablet 5 mg  5 mg Oral Daily Money, Lowry Ram, FNP   5 mg  at 12/28/16 1157  . magnesium hydroxide (MILK OF MAGNESIA) suspension 30 mL  30 mL Oral Daily PRN Niel Hummer, NP      . traZODone (DESYREL) tablet 50 mg  50 mg Oral QHS PRN Niel Hummer, NP   50 mg at 12/27/16 2339   PTA Medications: Prescriptions Prior to Admission  Medication Sig Dispense Refill Last Dose  . insulin NPH-regular Human (NOVOLIN 70/30) (70-30) 100 UNIT/ML injection Inject 25 Units into the skin 2 (two) times daily with a meal. (Patient taking differently: Inject into the skin See admin  instructions. Uses a sliding scale.) 10 mL 11 12/25/2016  . neomycin-bacitracin-polymyxin (NEOSPORIN) ointment Apply 1 application topically as needed for wound care. apply to eye   12/24/2016    Musculoskeletal: Strength & Muscle Tone: within normal limits Gait & Station: normal Patient leans: N/A  Psychiatric Specialty Exam: Physical Exam  Nursing note and vitals reviewed. Constitutional: He is oriented to person, place, and time. He appears well-developed and well-nourished.  Cardiovascular: Normal rate.   Respiratory: Effort normal.  Musculoskeletal: Normal range of motion.  Neurological: He is alert and oriented to person, place, and time.  Skin: Skin is warm.    Review of Systems  Constitutional: Negative.   HENT: Negative.   Eyes: Negative.   Respiratory: Negative.   Cardiovascular: Negative.   Gastrointestinal: Negative.   Genitourinary: Negative.   Musculoskeletal: Negative.   Skin: Negative.   Neurological: Negative.   Endo/Heme/Allergies: Negative.     Blood pressure (!) 144/96, pulse 78, temperature 97.8 F (36.6 C), temperature source Oral, resp. rate 16, height _0  (1.753 m), weight 85.3 kg (188 lb).Body mass index is 27.76 kg/m.  General Appearance: Disheveled  Eye Contact:  Good  Speech:  Clear and Coherent and Normal Rate  Volume:  Normal  Mood:  Depressed  Affect:  Depressed  Thought Process:  Coherent and Descriptions of Associations: Intact   Orientation:  Full (Time, Place, and Person)  Thought Content:  WDL  Suicidal Thoughts:  No Currently no SI  Homicidal Thoughts:  No  Memory:  Immediate;   Good Recent;   Good Remote;   Good  Judgement:  Fair  Insight:  Fair  Psychomotor Activity:  Normal  Concentration:  Concentration: Good and Attention Span: Good  Recall:  Good  Fund of Knowledge:  Good  Language:  Good  Akathisia:  No  Handed:  Right  AIMS (if indicated):     Assets:  Desire for Improvement Financial Resources/Insurance Housing Social Support Transportation  ADL's:  Intact  Cognition:  WNL  Sleep:  Number of Hours: 5.5    Treatment Plan Summary: Daily contact with patient to assess and evaluate symptoms and progress in treatment, Medication management and Plan is to:  -Start Abilify 2 mg PO Daily for mood stability -Start Wellbutrin 150 mg PO Daily for mood stability -Start Lisinopril 5 mg PO Daily for HTN -Continue Trazodone 50 mg PO QHS PRN for insomnia -Encourage group therapy participation   Observation Level/Precautions:  15 minute checks  Laboratory:  Reviewed  Psychotherapy:  Group therapy  Medications:  See MAR  Consultations:  As needed  Discharge Concerns:  Compliance  Estimated LOS: 3-5 Days  Other:  Admit to [facility] for Primary Diagnosis: MDD (major depressive disorder), recurrent severe, without psychosis ([place]) Long Term Goal(s): Improvement in symptoms so as ready for discharge  Short Term Goals: Ability to verbalize feelings will improve and Ability to disclose and discuss suicidal ideas  Physician Treatment Plan for Secondary Diagnosis: Principal Problem:   MDD (major depressive disorder), recurrent severe, without psychosis ([place])  Long Term Goal(s): Improvement in symptoms so as ready for discharge  Short Term Goals: Ability to maintain clinical measurements within normal limits will improve and Compliance with prescribed medications will  improve  I certify that inpatient services furnished can reasonably be expected to improve the patient's condition.    Lewis Shock, FNP 10/2/20181:15 PM   I have reviewed case with NP and have met with patient Agree with NP  assessment  45 year old separated , employed male.  States that he impulsively overdosed on dextromethorphan and on alcohol. He states this was triggered by his daughter stepping on a syringe in his home. States this syringe was from a friend who had used drugs at his home several days prior. States that after this event he felt a sense of guilt and " I felt like a failure".  States daughter's mother came to the home and found him " semiconscious", and called EMS. Reports some recent neuro-vegetative symptoms, including mild anhedonia, low energy level.  Reports that even prior to this event he had been feeling depressed, sad, but not suicidal . States " I had actually been feeling a little better because I was about to move in to new housing".  He has had prior psychiatric admissions, last time was about two years, history of depression, history of suicide attempts by overdosing . Denies history of psychosis, reports episodes of hypomania, but states these have been related to substance abuse . He states that in the past had been on Wellbutrin, Abilify, but has not taken any medications in several months , almost a year .  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  Denies BZD or Opiate abuse .   History of DM type I .   Dx- Suicide Attempt by Overdose, Substance Induced Mood Disorder, versus MDD, no Psychotic Features, Dextromethorphan Abuse, Alcohol Abuse   Plan -  Inpatient admission - was  started on Abilify and Wellbutrin XL which he has been on before with good response. Will currently D/C Wellbutrin XL  due to current risk of alcohol withdrawal (to avoid decreasing seizure threshold. ).  Patient also expressing interest in Prozac , which he states he took in the past without side effects.  Librium PRNs for potential alcohol WDL symptoms

## 2016-12-28 NOTE — BHH Suicide Risk Assessment (Addendum)
[facility] Admission Suicide Risk Assessment   Nursing information obtained from:   Chart and patient  Demographic factors:   31 , separated , has one daughter who lives with the mother, he is employed  Current Mental Status:   see below  Loss Factors:   daughter recently stepped on a syringe  Historical Factors:   history of depression,  Risk Reduction Factors:   sense of responsibility to child , family , employed    Total Time spent with patient: 45 minutes Principal Problem: MDD (major depressive disorder), recurrent severe, without psychosis (HCC) Diagnosis:   Patient Active Problem List   Diagnosis Date Noted  . MDD (major depressive disorder), recurrent severe, without psychosis (HCC) [F33.2] 12/27/2016  . 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 Severe recurrent major depression without psychotic features (HCC) [F33.2] 07/22/2014  . Suicide attempt ([facility]) [T14.91XA]     Continued Clinical Symptoms:  Alcohol Use Disorder Identification Test Final Score (AUDIT): 19 The "Alcohol Use Disorders Identification Test", Guidelines for Use in Primary Care, Second Edition.  World Science writer [facility]). Score between 0-7:  no or low risk or alcohol related problems. Score between 8-15:  moderate risk of alcohol related problems. Score between 16-19:  high risk of alcohol related problems. Score 20 or above:  warrants further diagnostic evaluation for alcohol dependence and treatment.   CLINICAL FACTORS:  31 year old separated , employed male.  States that he impulsively overdosed on dextromethorphan and on alcohol. He states this was triggered by his daughter stepping on a syringe in his home. States this syringe was from a friend who had used drugs at his home several days prior. States that after this event he felt a sense of guilt and " I felt like a failure".  States daughter's mother came to the home and found him " semiconscious", and called EMS. Reports some recent  neuro-vegetative symptoms, including mild anhedonia, low energy level.  Reports that even prior to this event he had been feeling depressed, sad, but not suicidal . States " I had actually been feeling a little better because I was about to move in to new housing".  He has had prior psychiatric admissions, last time was about two years, history of depression, history of suicide attempts by overdosing . Denies history of psychosis, reports episodes of hypomania, but states these have been related to substance abuse . He states that in the past had been on Wellbutrin, Abilify, but has not taken any medications in several months , almost a year .  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  Denies BZD or Opiate abuse .   History of DM type I .   Dx- Suicide Attempt by Overdose, Substance Induced Mood Disorder, versus MDD, no Psychotic Features, Dextromethorphan Abuse, Alcohol Abuse   Plan -  Inpatient admission - was  started on Abilify and Wellbutrin XL which he has been on before with good response. Will currently D/C Wellbutrin XL  due to current risk of alcohol withdrawal (to avoid decreasing seizure threshold. ). Patient also expressing interest in Prozac , which he states he took in the past without side effects.  Librium PRNs for potential alcohol WDL symptoms   Musculoskeletal: Strength & Muscle Tone: within normal limits Gait & Station: normal Patient leans: N/A  Psychiatric Specialty Exam: Physical Exam no tremors, no diaphoresis, psychomotor agitation or restlessness , denies visual disturbances, no headache, no flushing  ROS denies chest pain, no dyspnea, no vomiting .   Blood pressure (!) 144/96, pulse 78, temperature 97.8 F (36.6 C), temperature source Oral, resp. rate 16, height  (1.753 m), weight 85.3 kg (188 lb).Body mass index is 27.76 kg/m.  General Appearance: Fairly Groomed  Eye Contact:  Fair   Speech:  Normal Rate  Volume:  Normal  Mood:  depressed   Affect:  constricted, sad  Thought Process:  Linear and Descriptions of Associations: Intact  Orientation:  Other:  fully alert and attentive   Thought Content:  no hallucinations, no delusions, not internally preoccupied   Suicidal Thoughts:  No denies any suicidal or self injurious plan or intention at this time , denies any homicidal or violent ideations, contracts for safety at this time   Homicidal Thoughts:  No  Memory:  recent and remote grossly intact   Judgement:  Fair  Insight:  Fair  Psychomotor Activity:  Normal  Concentration:  Concentration: Good and Attention Span: Good  Recall:  Good  Fund of Knowledge:  Good  Language:  Good  Akathisia:  No  Handed:  Right  AIMS (if indicated):     Assets:  Communication Skills Desire for Improvement Resilience  ADL's:  Intact  Cognition:  WNL  Sleep:  Number of Hours: 5.5      COGNITIVE FEATURES THAT CONTRIBUTE TO RISK:  Closed-mindedness and Loss of executive function    SUICIDE RISK:   Moderate:  Frequent suicidal ideation with limited intensity, and duration, some specificity in terms of plans, no associated intent, good self-control, limited dysphoria/symptomatology, some risk factors present, and identifiable protective factors, including available and accessible social support.  PLAN OF CARE: Patient will be admitted to inpatient psychiatric unit for stabilization and safety. Will provide and encourage milieu participation. Provide medication management and maked adjustments as needed. Will also provide medication management to minimize risk of withdrawal.  Will follow daily.    I certify that inpatient services furnished can reasonably be expected to improve the patient's condition.   Craige Cotta, MD 12/28/2016, 2:57 PM

## 2016-12-28 NOTE — Progress Notes (Signed)
Nursing Progress Note [id number]  D) Patient presents with flat affect and depressed mood. Patient is a new admit to the unit and denies concerns for Clinical research associate. Patient took a shower and requested Trazodone for sleep. CBG was 184, no insulin required via sliding scale. BP 158/100, HR 89. Patient is asymptomatic and reports, "this is typical for me". Patient reports painful urination since having his foley catheter removed and requests Tylenol. PA notified. New orders to be received. Patient reports passive SI but denies HI/AVH. Patient contracts for safety on the unit. Plan of care reviewed and patient verbalizes understanding.  A) Emotional support given. 1:1 interaction and active listening provided. Patient medicated as prescribed. Medications and plan of care reviewed with patient. Patient verbalized understanding without further questions. Snacks and fluids provided. Opportunities for questions or concerns presented to patient. Patient encouraged to continue to work on treatment goals. Labs, vital signs and patient behavior monitored throughout shift. Patient safety maintained with q15 min safety checks. Low fall risk precautions in place and reviewed with patient; patient verbalized understanding.  R) Patient receptive to interaction with nurse. Patient remains safe on the unit at this time. Patient denies any adverse medication reactions at this time. Patient is resting in bed without complaints. Will continue to monitor.

## 2016-12-28 NOTE — Progress Notes (Signed)
DAR NOTE: Patient presents with bright affect and calm mood. Reports fair night sleep, good appetite, normal energy and good concentration. Pt has been observed in the day room interacting well with peers, complained of pain while urinating and elevated blood pressure, doctor is made aware of that. Denies SI/HI auditory and visual hallucinations and verbally contracted for safety.  Rates depression at 5, hopelessness at 0, and anxiety at 0.  Maintained on routine safety checks.  Medications given as prescribed.  Support and encouragement offered as needed.  Attended group and participated.  States goal for today is " good coping mechanisms." Will continue to monitor.

## 2016-12-28 NOTE — [facility] Group Notes (Signed)
LCSW Group Therapy Note  12/28/2016 1:15pm  Type of Therapy/Topic:  Group Therapy:  Feelings about Diagnosis  Participation Level:  Active   Description of Group:   This group will allow patients to explore their thoughts and feelings about diagnoses they have received. Patients will be guided to explore their level of understanding and acceptance of these diagnoses. Facilitator will encourage patients to process their thoughts and feelings about the reactions of others to their diagnosis and will guide patients in identifying ways to discuss their diagnosis with significant others in their lives. This group will be process-oriented, with patients participating in exploration of their own experiences, giving and receiving support, and processing challenge from other group members.   Therapeutic Goals: 1. Patient will demonstrate understanding of diagnosis as evidenced by identifying two or more symptoms of the disorder 2. Patient will be able to express two feelings regarding the diagnosis 3. Patient will demonstrate their ability to communicate their needs through discussion and/or role play  Summary of Patient Progress: Pt reports that his symptoms have worsened over the years, especially his depression and difficulty maintaining healthy relationships. Pt reports that he is fearful that he will be hurt, so he chooses not to engage in relationships. Pt expressed being relieved when he received a diagnosis because he had an opportunity to begin treatment and develop effective coping skills.       Therapeutic Modalities:   Cognitive Behavioral Therapy Brief Therapy Feelings Identification    Verdene Lennert, LCSW 12/28/2016 2:50 PM

## 2016-12-28 NOTE — Progress Notes (Signed)
Recreation Therapy Notes  Animal-Assisted Activity (AAA) Program Checklist/Progress Notes Patient Eligibility Criteria Checklist & Daily Group note for Rec TxIntervention  Date: 10.02.2018 Time: 11:25am Location: [address]   AAA/T Program Assumption of Risk Form signed by Patient/ or Parent Legal Guardian Yes  Patient is free of allergies or sever asthma Yes  Patient reports no fear of animals Yes  Patient reports no history of cruelty to animals Yes  Patient understands his/her participation is voluntary Yes  Patient washes hands before animal contact Yes  Patient washes hands after animal contact Yes  Behavioral Response: Appropriate   Education:Hand Washing, Appropriate Animal Interaction   Education Outcome: Acknowledges education.   Clinical Observations/Feedback: Patient attended session and interacted appropriately with therapy dog and peers.   Marykay Lex Noreta Kue, LRT/CTRS        Jearl Klinefelter 12/28/2016 2:31 PM

## 2016-12-28 NOTE — Progress Notes (Signed)
Patient ID: Raymond [name], male   DOB: 21-Jul-1985, 31 y.o.   MRN: [id number] Pt visible in the milieu. Interacting appropriately with staff and peers.  Pt reports being anxious stating he just had a phone call that didn't go well. Pt did not want to discuss the call.  Support offered.  Pt reported passive SI and contracts for safety.  Pt denied HI and AVH.  Needs assessed. Pt denied.  Fifteen minute checks continue for patient safety. Pt safe on unit.
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
## 2016-12-28 NOTE — Progress Notes (Signed)
Inpatient Diabetes Program Recommendations  AACE/ADA: New Consensus Statement on Inpatient Glycemic Control (2015)  Target Ranges:  Prepandial:   less than 140 mg/dL      Peak postprandial:   less than 180 mg/dL (1-2 hours)      Critically ill patients:  140 - 180 mg/dL   Results for Raymond [name], Raymond [name] (MRN [id number]) as of 12/28/2016 07:05  Ref. Range 12/27/2016 22:56 12/28/2016 06:00  Glucose-Capillary Latest Ref Range: 65 - 99 mg/dL 086 (H) 578 (H)    Admit with: Suicidal Thoughts  History: DM  Home DM Meds: 70/30 Insulin- 25 units BID  Current Insulin Orders: Novolog Moderate Correction Scale/ SSI (0-15 units) TID AC + HS        MD- Note patient takes 70/30 Insulin BID with Breakfast and Dinner at home.  Has been allowed Carbohydrate Modified diet.  If patient appetite is good, please consider restarting patient's 70/30 Insulin.  Would start with 80% of total home dose and Titrate upward as needed:  Recommend 70/30 Insulin- 20 units BID with meals (start today)     --Will follow patient during hospitalization--  Ambrose Finland RN, MSN, CDE Diabetes Coordinator Inpatient Glycemic Control Team Team Pager: [phone] (8a-5p)

## 2016-12-29 DIAGNOSIS — F17220 Nicotine dependence, chewing tobacco, uncomplicated: Secondary | ICD-10-CM

## 2016-12-29 DIAGNOSIS — F419 Anxiety disorder, unspecified: Secondary | ICD-10-CM

## 2016-12-29 LAB — GLUCOSE, CAPILLARY
Glucose-Capillary: 455 mg/dL — ABNORMAL HIGH (ref 65–99)
Glucose-Capillary: 411 mg/dL — ABNORMAL HIGH (ref 65–99)
Glucose-Capillary: 437 mg/dL — ABNORMAL HIGH (ref 65–99)
Glucose-Capillary: 412 mg/dL — ABNORMAL HIGH (ref 65–99)

## 2016-12-29 MED ORDER — ARIPIPRAZOLE 5 MG PO TABS
5.0000 mg | ORAL_TABLET | Freq: Every day | ORAL | Status: DC
  Administered 2016-12-30 – 2017-01-03 (×5): 5 mg via ORAL
  Filled 2016-12-29 (×8): qty 1
  Filled 2016-12-29: qty 7

## 2016-12-29 MED ORDER — INSULIN ASPART 100 UNIT/ML ~~LOC~~ SOLN
0.0000 [IU] | Freq: Three times a day (TID) | SUBCUTANEOUS | Status: DC
  Administered 2016-12-30 (×2): 15 [IU] via SUBCUTANEOUS
  Administered 2016-12-30: 2 [IU] via SUBCUTANEOUS
  Administered 2016-12-31: 11 [IU] via SUBCUTANEOUS
  Administered 2016-12-31: 8 [IU] via SUBCUTANEOUS
  Administered 2016-12-31: 15 [IU] via SUBCUTANEOUS
  Administered 2017-01-01: 2 [IU] via SUBCUTANEOUS
  Administered 2017-01-01: 11 [IU] via SUBCUTANEOUS
  Administered 2017-01-01 – 2017-01-02 (×3): 15 [IU] via SUBCUTANEOUS
  Administered 2017-01-03: 5 [IU] via SUBCUTANEOUS
  Administered 2017-01-03: 15 [IU] via SUBCUTANEOUS

## 2016-12-29 MED ORDER — METFORMIN HCL 500 MG PO TABS
500.0000 mg | ORAL_TABLET | Freq: Two times a day (BID) | ORAL | Status: DC
  Filled 2016-12-29 (×2): qty 1

## 2016-12-29 MED ORDER — METFORMIN HCL 500 MG PO TABS
500.0000 mg | ORAL_TABLET | Freq: Two times a day (BID) | ORAL | Status: DC
  Administered 2016-12-30 – 2017-01-03 (×9): 500 mg via ORAL
  Filled 2016-12-29 (×2): qty 1
  Filled 2016-12-29: qty 14
  Filled 2016-12-29 (×4): qty 1
  Filled 2016-12-29: qty 14
  Filled 2016-12-29 (×6): qty 1

## 2016-12-29 MED ORDER — INSULIN ASPART 100 UNIT/ML ~~LOC~~ SOLN
4.0000 [IU] | Freq: Three times a day (TID) | SUBCUTANEOUS | Status: DC
  Administered 2016-12-30 – 2017-01-03 (×14): 4 [IU] via SUBCUTANEOUS
  Filled 2016-12-29: qty 0.04

## 2016-12-29 MED ORDER — INSULIN ASPART 100 UNIT/ML ~~LOC~~ SOLN
18.0000 [IU] | Freq: Once | SUBCUTANEOUS | Status: AC
  Administered 2016-12-29: 18 [IU] via SUBCUTANEOUS

## 2016-12-29 MED ORDER — INSULIN GLARGINE 100 UNIT/ML ~~LOC~~ SOLN
8.0000 [IU] | Freq: Every day | SUBCUTANEOUS | Status: DC
  Administered 2016-12-29 – 2016-12-31 (×3): 8 [IU] via SUBCUTANEOUS
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
## 2016-12-29 NOTE — [facility] Group Notes (Signed)
[facility] Group Therapy 12/29/2016 1:15pm  Type of Therapy: Mental Health Association Presentation  Participation Level: Active  Participation Quality: Attentive  Affect: Appropriate  Cognitive: Oriented  Insight: Developing/Improving  Engagement in Therapy: Engaged  Modes of Intervention: Discussion, Education and Socialization  Summary of Progress/Problems: Mental Health Association (MHA) Speaker came to talk about his personal journey with living with a mental health diagnosis. The pt processed ways by which to relate to the speaker. MHA speaker provided handouts and educational information pertaining to groups and services offered by the MHA. Pt was engaged in speaker's presentation and was receptive to resources provided.    Zuriyah Shatz B Petrina Melby, MSW, LCSWA 12/29/2016 5:10 PM   
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
## 2016-12-29 NOTE — Progress Notes (Signed)
Patient ID: Raymond [name], male   DOB: [birth year], 31 y.o.   MRN: [id number]  D: Patient observed playing cards and interacting well with peers. Pt reports he had a good. Pt stated he is  watching his diet but is worried about his increasing blood sugar levels. Pt attended evening wrap up group and Interacted appropriately with peers. Denies  SI/HI/AVH and pain.No behavioral issues noted.  A: Support and encouragement offered as needed. Pt educated about the importance of following a prescribed meal plan. Medications administered as prescribed.  R: Patient is safe and cooperative on unit. Will continue to monitor  for safety and stability.

## 2016-12-29 NOTE — Progress Notes (Signed)
Recreation Therapy Notes  Date: 12/29/16 Time: 0930 Location: [address] Dayroom  Group Topic: Stress Management  Goal Area(s) Addresses:  Patient will verbalize importance of using healthy stress management.  Patient will identify positive emotions associated with healthy stress management.   Behavioral Response: Engaged  Intervention: Stress Management  Activity :  Meditation.  LRT introduced the stress management technique of meditation.  LRT played a meditation from the Calm app that allowed patients the opportunity to take inventory of any feelings or sensations they were feeling throughout their bodies.  Patients were to listen and follow along as the meditation played.  Education:  Stress Management, Discharge Planning.   Education Outcome: Acknowledges edcuation/In group clarification offered/Needs additional education  Clinical Observations/Feedback: Pt attended group.   Caroll Rancher, LRT/CTRS         Caroll Rancher A 12/29/2016 11:36 AM

## 2016-12-29 NOTE — Progress Notes (Signed)
CBG 455 this AM, PA notified and order given to administer top number 15u according to scale.

## 2016-12-29 NOTE — Progress Notes (Signed)
Patient pre-dinner CBG 437. Arvin Collard NP who ordered the 15 units sliding scale dose and diet education. Insulin given, discussed dietary choices. Patient insists that he is using artificial sweeteners in his coffee and not eating excessive carbohydrate.

## 2016-12-29 NOTE — Progress Notes (Signed)
Dossie Arbour was in room and in bed and did not attend evening group activity. He spoke about how he was just doing ok and spoke about how there was nothing special about his day. He had minimal interaction or participation with peers in milieu. Ravin was able to receive all bedtime medication without incident, did not verbalize any complaints of pain and spent a good majority of his evening in his room. A. Support and encouragement provided. R. Safety maintained, will continue to monitor.

## 2016-12-29 NOTE — Progress Notes (Signed)
Pt attended wrap-up group and participated in a group activity.  

## 2016-12-29 NOTE — Progress Notes (Signed)
CBG at noon is 411, Dr notified and instructed to give 15 units of Novolog sliding scale.

## 2016-12-29 NOTE — Tx Team (Signed)
Interdisciplinary Treatment and Diagnostic Plan Update 12/29/2016 Time of Session: 9:30am  Raymond [name]  MRN: [id number]  Principal Diagnosis: MDD (major depressive disorder), recurrent severe, without psychosis (HCC)  Secondary Diagnoses: Principal Problem:   MDD (major depressive disorder), recurrent severe, without psychosis (HCC)   Current Medications:  Current Facility-Administered Medications  Medication Dose Route Frequency Provider Last Rate Last Dose  . acetaminophen (TYLENOL) tablet 650 mg  650 mg Oral Q6H PRN Fransisca Kaufmann A, NP   650 mg at 12/29/16 0150  . alum & mag hydroxide-simeth (MAALOX/MYLANTA) 200-200-20 MG/5ML suspension 30 mL  30 mL Oral Q4H PRN Thermon Leyland, NP      . Melene Muller ON 12/30/2016] ARIPiprazole (ABILIFY) tablet 5 mg  5 mg Oral Daily Money, Gerlene Burdock, FNP      . chlordiazePOXIDE (LIBRIUM) capsule 25 mg  25 mg Oral Q6H PRN Cobos, Fernando A, MD      . FLUoxetine (PROZAC) capsule 20 mg  20 mg Oral Daily Cobos, Rockey Situ, MD   20 mg at 12/29/16 0758  . hydrOXYzine (ATARAX/VISTARIL) tablet 25 mg  25 mg Oral Q6H PRN Cobos, Fernando A, MD      . insulin aspart (novoLOG) injection 0-15 Units  0-15 Units Subcutaneous TID WC Thermon Leyland, NP   15 Units at 12/29/16 1213  . insulin aspart (novoLOG) injection 0-5 Units  0-5 Units Subcutaneous QHS Thermon Leyland, NP   4 Units at 12/28/16 2111  . lisinopril (PRINIVIL,ZESTRIL) tablet 5 mg  5 mg Oral Daily Money, Gerlene Burdock, FNP   5 mg at 12/29/16 0757  . loperamide (IMODIUM) capsule 2-4 mg  2-4 mg Oral PRN Cobos, Rockey Situ, MD      . magnesium hydroxide (MILK OF MAGNESIA) suspension 30 mL  30 mL Oral Daily PRN Fransisca Kaufmann A, NP      . multivitamin with minerals tablet 1 tablet  1 tablet Oral Daily Cobos, Rockey Situ, MD   1 tablet at 12/29/16 0758  . ondansetron (ZOFRAN-ODT) disintegrating tablet 4 mg  4 mg Oral Q6H PRN Cobos, Fernando A, MD      . thiamine (VITAMIN B-1) tablet 100 mg  100 mg Oral Daily Cobos, Rockey Situ, MD   100 mg at 12/29/16 0758  . traZODone (DESYREL) tablet 50 mg  50 mg Oral QHS PRN Thermon Leyland, NP   50 mg at 12/28/16 2200    PTA Medications: Prescriptions Prior to Admission  Medication Sig Dispense Refill Last Dose  . insulin NPH-regular Human (NOVOLIN 70/30) (70-30) 100 UNIT/ML injection Inject 25 Units into the skin 2 (two) times daily with a meal. (Patient taking differently: Inject into the skin See admin instructions. Uses a sliding scale.) 10 mL 11 12/25/2016  . neomycin-bacitracin-polymyxin (NEOSPORIN) ointment Apply 1 application topically as needed for wound care. apply to eye   12/24/2016    Treatment Modalities: Medication Management, Group therapy, Case management,  1 to 1 session with clinician, Psychoeducation, Recreational therapy.  Patient Stressors: Marital or family conflict Medication change or noncompliance Substance abuse Patient Strengths: Ability for insight Average or above average intelligence Wellsite geologist fund of knowledge Motivation for treatment/growth  Physician Treatment Plan for Primary Diagnosis: MDD (major depressive disorder), recurrent severe, without psychosis (HCC) Long Term Goal(s): Improvement in symptoms so as ready for discharge Short Term Goals: Ability to verbalize feelings will improve Ability to disclose and discuss suicidal ideas Ability to maintain clinical measurements within normal limits will improve Compliance with prescribed  medications will improve  Medication Management: Evaluate patient's response, side effects, and tolerance of medication regimen.  Therapeutic Interventions: 1 to 1 sessions, Unit Group sessions and Medication administration.  Evaluation of Outcomes: Progressing  Physician Treatment Plan for Secondary Diagnosis: Principal Problem:   MDD (major depressive disorder), recurrent severe, without psychosis (HCC)  Long Term Goal(s): Improvement in symptoms so as ready for  discharge  Short Term Goals: Ability to verbalize feelings will improve Ability to disclose and discuss suicidal ideas Ability to maintain clinical measurements within normal limits will improve Compliance with prescribed medications will improve  Medication Management: Evaluate patient's response, side effects, and tolerance of medication regimen.  Therapeutic Interventions: 1 to 1 sessions, Unit Group sessions and Medication administration.  Evaluation of Outcomes: Progressing  RN Treatment Plan for Primary Diagnosis: MDD (major depressive disorder), recurrent severe, without psychosis (HCC) Long Term Goal(s): Knowledge of disease and therapeutic regimen to maintain health will improve  Short Term Goals: Ability to verbalize feelings will improve and Compliance with prescribed medications will improve  Medication Management: RN will administer medications as ordered by provider, will assess and evaluate patient's response and provide education to patient for prescribed medication. RN will report any adverse and/or side effects to prescribing provider.  Therapeutic Interventions: 1 on 1 counseling sessions, Psychoeducation, Medication administration, Evaluate responses to treatment, Monitor vital signs and CBGs as ordered, Perform/monitor CIWA, COWS, AIMS and Fall Risk screenings as ordered, Perform wound care treatments as ordered.  Evaluation of Outcomes: Progressing  LCSW Treatment Plan for Primary Diagnosis: MDD (major depressive disorder), recurrent severe, without psychosis (HCC) Long Term Goal(s): Safe transition to appropriate next level of care at discharge, Engage patient in therapeutic group addressing interpersonal concerns. Short Term Goals: Engage patient in aftercare planning with referrals and resources, Increase emotional regulation, Identify triggers associated with mental health/substance abuse issues and Increase skills for wellness and recovery  Therapeutic  Interventions: Assess for all discharge needs, 1 to 1 time with Social worker, Explore available resources and support systems, Assess for adequacy in community support network, Educate family and significant other(s) on suicide prevention, Complete Psychosocial Assessment, Interpersonal group therapy.  Evaluation of Outcomes: Progressing  Progress in Treatment: Attending groups: Yes Participating in groups: Yes Taking medication as prescribed: Yes, MD continues to assess for medication changes as needed Toleration medication: Yes, no side effects reported at this time Family/Significant other contact made: No, CSW assessing for appropriate contact Patient understands diagnosis: Continuing to assess Discussing patient identified problems/goals with staff: Yes Medical problems stabilized or resolved: Yes Denies suicidal/homicidal ideation: Yes Issues/concerns per patient self-inventory: None Other: N/A  New problem(s) identified: None identified at this time.   New Short Term/Long Term Goal(s): None identified at this time.   Discharge Plan or Barriers: Pt will return home and follow up outpatient with [facility].  Reason for Continuation of Hospitalization:  Anxiety  Depression Medication stabilization Suicidal ideation  Estimated Length of Stay: 1-3 days; Estimated discharge date 01/01/17  Attendees: Patient: 12/29/2016 4:43 PM  Physician: Dr. Jama Flavors 12/29/2016 4:43 PM  Nursing: Boyd Kerbs RN; Marshall, RN 12/29/2016 4:43 PM  RN Care Manager: Onnie Boer, RN 12/29/2016 4:43 PM  Social Worker: Donnelly Stager, LCSWA 12/29/2016 4:43 PM  Recreational Therapist:  12/29/2016 4:43 PM  Other: Armandina Stammer, NP 12/29/2016 4:43 PM  Other:  12/29/2016 4:43 PM  Other: 12/29/2016 4:43 PM  Scribe for Treatment Team: Jonathon Jordan, MSW,LCSWA 12/29/2016 4:43 PM

## 2016-12-30 LAB — GLUCOSE, CAPILLARY
Glucose-Capillary: 128 mg/dL — ABNORMAL HIGH (ref 65–99)
Glucose-Capillary: 434 mg/dL — ABNORMAL HIGH (ref 65–99)
Glucose-Capillary: 433 mg/dL — ABNORMAL HIGH (ref 65–99)
Glucose-Capillary: 351 mg/dL — ABNORMAL HIGH (ref 65–99)

## 2016-12-30 LAB — URINALYSIS, ROUTINE W REFLEX MICROSCOPIC
Bacteria, UA: NONE SEEN
Bilirubin Urine: NEGATIVE
Glucose, UA: NEGATIVE mg/dL
Hgb urine dipstick: NEGATIVE
Ketones, ur: NEGATIVE mg/dL
Leukocytes, UA: NEGATIVE
Nitrite: NEGATIVE
Protein, ur: 100 mg/dL — AB
Specific Gravity, Urine: 1.021 (ref 1.005–1.030)
Squamous Epithelial / LPF: NONE SEEN
pH: 5 (ref 5.0–8.0)

## 2016-12-30 LAB — BASIC METABOLIC PANEL
Anion gap: 7 (ref 5–15)
BUN: 14 mg/dL (ref 6–20)
CO2: 29 mmol/L (ref 22–32)
Calcium: 9.1 mg/dL (ref 8.9–10.3)
Chloride: 103 mmol/L (ref 101–111)
Creatinine, Ser: 0.81 mg/dL (ref 0.61–1.24)
GFR calc Af Amer: >60 mL/min (ref >60)
GFR calc non Af Amer: >60 mL/min (ref >60)
Glucose, Bld: 132 mg/dL — ABNORMAL HIGH (ref 65–99)
Potassium: 4.1 mmol/L (ref 3.5–5.1)
Sodium: 139 mmol/L (ref 135–145)

## 2016-12-30 LAB — HEMOGLOBIN A1C
Hgb A1c MFr Bld: 11.3 % — ABNORMAL HIGH (ref 4.8–5.6)
Mean Plasma Glucose: 277.61 mg/dL

## 2016-12-30 MED ORDER — MIRTAZAPINE 7.5 MG PO TABS
7.5000 mg | ORAL_TABLET | Freq: Every day | ORAL | Status: DC
  Administered 2016-12-30 – 2016-12-31 (×2): 7.5 mg via ORAL
  Filled 2016-12-30 (×4): qty 1
  Filled 2016-12-30: qty 7
  Filled 2016-12-30: qty 1

## 2016-12-30 NOTE — Progress Notes (Signed)
Patient ID: Raymond [name], male   DOB: 1985/07/20, 31 y.o.   MRN: [id number]  D: Patient observed watching TV and interacting well with peers. Pt reports he worried about his job because recent lay off at the company. Pt attended evening wrap up group and engaged in discussions. Denies  SI/HI/AVH and pain.No behavioral issues noted.  A: Support and encouragement offered as needed. Pt educated about the importance of following a prescribed meal plan. Medications administered as prescribed.  R: Patient is safe and cooperative on unit. Will continue to monitor  for safety and stability.

## 2016-12-30 NOTE — [facility] Group Notes (Signed)
Adult Psychoeducational Group Note  Date:  12/30/2016 Time:  5:22 PM  Group Topic/Focus:  Goals Group:   The focus of this group is to help patients establish daily goals to achieve during treatment and discuss how the patient can incorporate goal setting into their daily lives to aide in recovery.  Participation Level:  Active  Participation Quality:  Appropriate  Affect:  Appropriate  Cognitive:  Alert  Insight: Appropriate  Engagement in Group:  Engaged  Modes of Intervention:  Activity  Additional Comments:  Pt was alert and engaged in group activity. Pt goal for today is to develop coping skills.  Dellia Nims 12/30/2016, 5:22 PM

## 2016-12-30 NOTE — Plan of Care (Signed)
Problem: Coping: Goal: Ability to verbalize feelings will improve Outcome: Progressing Pt stated his daughter is his reason for living and taking care of himself.

## 2016-12-30 NOTE — Progress Notes (Addendum)
CSW made call to [facility] to inquire if CPS report had been made. Awaiting return call.   Received return call; no report on file. CSW made CPS report regarding incident prior to admission.   Vernie Shanks, LCSW Clinical Social Work [phone]

## 2016-12-30 NOTE — [facility] Group Notes (Signed)
LCSW Group Therapy Note  12/30/2016 1:15pm  Type of Therapy/Topic:  Group Therapy:  Balance in Life  Participation Level:  Active  Description of Group:    This group will address the concept of balance and how it feels and looks when one is unbalanced. Patients will be encouraged to process areas in their lives that are out of balance and identify reasons for remaining unbalanced. Facilitators will guide patients in utilizing problem-solving interventions to address and correct the stressor making their life unbalanced. Understanding and applying boundaries will be explored and addressed for obtaining and maintaining a balanced life. Patients will be encouraged to explore ways to assertively make their unbalanced needs known to significant others in their lives, using other group members and facilitator for support and feedback.  Therapeutic Goals: 1. Patient will identify two or more emotions or situations they have that consume much of in their lives. 2. Patient will identify signs/triggers that life has become out of balance:  3. Patient will identify two ways to set boundaries in order to achieve balance in their lives:  4. Patient will demonstrate ability to communicate their needs through discussion and/or role plays  Summary of Patient Progress: Pt shares that he knows his life is unbalanced when he is impulsive and making poor decisions. He reports that his relationships are typically chaotic. He reports typically self-medicating with alcohol when he feels overwhelmed.      Therapeutic Modalities:   Cognitive Behavioral Therapy Solution-Focused Therapy Assertiveness Training  Verdene Lennert, LCSW 12/30/2016 3:54 PM

## 2016-12-30 NOTE — Plan of Care (Signed)
Problem: Education: Goal: Emotional status will improve Outcome: Progressing Patient's mood is stable today; he states he is working on "healthy coping mechanisms.

## 2016-12-30 NOTE — [facility] Suicide Risk Assessment (Signed)
[facility] INPATIENT:  Family/Significant Other Suicide Prevention Education  Suicide Prevention Education:  Education Completed; Raymond [name], Pt's wife [phone], has been identified by the patient as the family member/significant other with whom the patient will be residing, and identified as the person(s) who will aid the patient in the event of a mental health crisis (suicidal ideations/suicide attempt).  With written consent from the patient, the family member/significant other has been provided the following suicide prevention education, prior to the and/or following the discharge of the patient.  The suicide prevention education provided includes the following:  Suicide risk factors  Suicide prevention and interventions  National Suicide Hotline telephone number  [facility] assessment telephone number  [facility] Emergency Assistance 911  [facility] and/or Residential Mobile Crisis Unit telephone number  Request made of family/significant other to:  Remove weapons (e.g., guns, rifles, knives), all items previously/currently identified as safety concern.    Remove drugs/medications (over-the-counter, prescriptions, illicit drugs), all items previously/currently identified as a safety concern.  The family member/significant other verbalizes understanding of the suicide prevention education information provided.  The family member/significant other agrees to remove the items of safety concern listed above.  Per wife, Pt has history of aggression towards her, substance abuse, and manipulation. Pt wife reports she and Pt are separated and is trying to keep her daughter safe due to Pt's increased use and impulsivity. Pt's wife aware that CPS will be made (if not already made by ED CSW).   Raymond [name] 12/30/2016, 2:54 PM

## 2016-12-30 NOTE — Progress Notes (Signed)
D: Patient still reports some depressive symptoms.  He denies any hopelessness or anxiety.  Patient's CBG at 1200 was 437 and NP was notified of result.  He states, "I ate two bananas."  Patient rates his depression as a 5 today.  He denies any thoughts of self harm.  His goal is work on "healthy coping mechanisms."  Patient's sleep and appetite is fair; his energy level is normal.  Patient is observed in the day room interacting well with his peers.  He is attending group and participating in his treatment. A: Continue to monitor medication management and MD orders.  Safety checks completed every 15 minutes per protocol.  Offer support and encouragement as needed. R: Patient is receptive to staff; his behavior is appropriate.

## 2016-12-30 NOTE — Progress Notes (Signed)
Inpatient Diabetes Program Recommendations  AACE/ADA: New Consensus Statement on Inpatient Glycemic Control (2015)  Target Ranges:  Prepandial:   less than 140 mg/dL      Peak postprandial:   less than 180 mg/dL (1-2 hours)      Critically ill patients:  140 - 180 mg/dL   Lab Results  Component Value Date   GLUCAP 128 (H) 12/30/2016   HGBA1C 11.3 (H) 12/30/2016    Review of Glycemic Control  Diabetes history: DM  Outpatient Diabetes medications: 70/30 25 units BID (Equivalent of 35 units long acting portion and 15 units of short acting portion)  Current orders for Inpatient glycemic control:  Lantus 8 units, Metformin 500 mg BID, Novolog Moderate 0-15 units tid + Novolog 4 units meal coverage tid + novolog HS scale 0-5 units   Lantus 8 units added yesterday. Glucose trends decreased at 128 this am. Watching trends.  Thanks,  Christena Deem RN, MSN, [facility] Inpatient Diabetes Coordinator Team Pager [phone] (8a-5p)

## 2016-12-30 NOTE — Progress Notes (Signed)
[facility] MD Progress Note  12/30/2016 2:41 PM Raymond [name]  MRN:  [id number]   Subjective:  Patient reports that he feels better today. He has had some time to think about all the things going on with his daughter. He denies any SI/HI/AVH and rates depression at 3/10 and no anxiety. He states that he has difficulty staying asleep and wanted to know what would help since he has been taking the other PRN medication with no help.  Objective: Patients chart and findings reviewed and discussed with treatment team. Patient seems to be improving. He appears depressed when discussing his daughter, but otherwise has been seen in the day room interacting appropriately with staff and other patients. Will add Mirtazapine 7.5 mg QHS to assist with sleep and mood.  Principal Problem: MDD (major depressive disorder), recurrent severe, without psychosis ([place]) Diagnosis:   Patient Active Problem List   Diagnosis Date Noted  . MDD (major depressive disorder), recurrent severe, without psychosis ([place]) [F33.2] 12/27/2016  . 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 Severe recurrent major depression without psychotic features ([place]) [F33.2] 07/22/2014  . Suicide attempt ([place]) [T14.91XA]    Total Time spent with patient: 25 minutes  Past Psychiatric History: See H&P  Past Medical History:  Past Medical History:  Diagnosis Date  . Diabetes mellitus without complication (HCC)    Type 1  . Gallstones     Past Surgical History:  Procedure Laterality Date  . CHOLECYSTECTOMY     Family History: History reviewed. No pertinent family history. Family Psychiatric  History: See H&P Social History:  History  Alcohol Use  . 0.6 - 1.2 oz/week  . 1 - 2 Cans of beer per week    Comment: few times a week     History  Drug Use  . Types: Other-see comments    Comment: OTC cough medicine    Social History   Social History  . Marital status: Single    Spouse name: N/A  . Number of children: N/A  .  Years of education: N/A   Social History Main Topics  . Smoking status: Never Smoker  . Smokeless tobacco: Current User    Types: Chew  . Alcohol use 0.6 - 1.2 oz/week    1 - 2 Cans of beer per week     Comment: few times a week  . Drug use: Yes    Types: Other-see comments     Comment: OTC cough medicine  . Sexual activity: Yes    Birth control/ protection: None   Other Topics Concern  . None   Social History Narrative  . None   Additional Social History:    Pain Medications: see MAR Prescriptions: see MAR Over the Counter: see MAR History of alcohol / drug use?: Yes Name of Substance 1: alcohol 1 - Age of First Use: UTA 1 - Amount (size/oz): 6-7 cans of beer 1 - Frequency: daily 1 - Duration: UTA 1 - Last Use / Amount: UTA                  Sleep: Fair  Appetite:  Good  Current Medications: Current Facility-Administered Medications  Medication Dose Route Frequency Provider Last Rate Last Dose  . acetaminophen (TYLENOL) tablet 650 mg  650 mg Oral Q6H PRN Elmarie Shiley A, NP   650 mg at 12/29/16 0150  . alum & mag hydroxide-simeth (MAALOX/MYLANTA) 200-200-20 MG/5ML suspension 30 mL  30 mL Oral Q4H PRN Niel Hummer, NP      .  ARIPiprazole (ABILIFY) tablet 5 mg  5 mg Oral Daily Money, Lowry Ram, FNP   5 mg at 12/30/16 7579  . chlordiazePOXIDE (LIBRIUM) capsule 25 mg  25 mg Oral Q6H PRN Cobos, Myer Peer, MD      . FLUoxetine (PROZAC) capsule 20 mg  20 mg Oral Daily Cobos, Myer Peer, MD   20 mg at 12/30/16 0751  . hydrOXYzine (ATARAX/VISTARIL) tablet 25 mg  25 mg Oral Q6H PRN Cobos, Fernando A, MD      . insulin aspart (novoLOG) injection 0-15 Units  0-15 Units Subcutaneous TID WC Patriciaann Clan E, PA-C   15 Units at 12/30/16 1211  . insulin aspart (novoLOG) injection 0-5 Units  0-5 Units Subcutaneous QHS Niel Hummer, NP   4 Units at 12/28/16 2111  . insulin aspart (novoLOG) injection 4 Units  4 Units Subcutaneous TID WC Patriciaann Clan E, PA-C   4 Units at  12/30/16 1212  . insulin glargine (LANTUS) injection 8 Units  8 Units Subcutaneous QHS Laverle Hobby, PA-C   8 Units at 12/29/16 2207  . lisinopril (PRINIVIL,ZESTRIL) tablet 5 mg  5 mg Oral Daily Money, Lowry Ram, FNP   5 mg at 12/30/16 0750  . loperamide (IMODIUM) capsule 2-4 mg  2-4 mg Oral PRN Cobos, Myer Peer, MD      . magnesium hydroxide (MILK OF MAGNESIA) suspension 30 mL  30 mL Oral Daily PRN Elmarie Shiley A, NP      . metFORMIN (GLUCOPHAGE) tablet 500 mg  500 mg Oral BID WC Green, Terri L, RPH   500 mg at 12/30/16 0750  . multivitamin with minerals tablet 1 tablet  1 tablet Oral Daily Cobos, Myer Peer, MD   1 tablet at 12/30/16 0750  . ondansetron (ZOFRAN-ODT) disintegrating tablet 4 mg  4 mg Oral Q6H PRN Cobos, Fernando A, MD      . thiamine (VITAMIN B-1) tablet 100 mg  100 mg Oral Daily Cobos, Myer Peer, MD   100 mg at 12/30/16 0751  . traZODone (DESYREL) tablet 50 mg  50 mg Oral QHS PRN Niel Hummer, NP   50 mg at 12/29/16 2206    Lab Results:  Results for orders placed or performed during the hospital encounter of 12/27/16 (from the past 48 hour(s))  Glucose, capillary     Status: Abnormal   Collection Time: 12/28/16  4:26 PM  Result Value Ref Range   Glucose-Capillary 387 (H) 65 - 99 mg/dL  Glucose, capillary     Status: Abnormal   Collection Time: 12/28/16  8:31 PM  Result Value Ref Range   Glucose-Capillary 301 (H) 65 - 99 mg/dL  Glucose, capillary     Status: Abnormal   Collection Time: 12/29/16  6:27 AM  Result Value Ref Range   Glucose-Capillary 455 (H) 65 - 99 mg/dL   Comment 1 Notify RN   Glucose, capillary     Status: Abnormal   Collection Time: 12/29/16 12:05 PM  Result Value Ref Range   Glucose-Capillary 411 (H) 65 - 99 mg/dL  Glucose, capillary     Status: Abnormal   Collection Time: 12/29/16  5:24 PM  Result Value Ref Range   Glucose-Capillary 437 (H) 65 - 99 mg/dL  Glucose, capillary     Status: Abnormal   Collection Time: 12/29/16  9:33 PM   Result Value Ref Range   Glucose-Capillary 412 (H) 65 - 99 mg/dL  Urinalysis, Routine w reflex microscopic     Status: Abnormal  Collection Time: 12/30/16  6:00 AM  Result Value Ref Range   Color, Urine YELLOW YELLOW   APPearance HAZY (A) CLEAR   Specific Gravity, Urine 1.021 1.005 - 1.030   pH 5.0 5.0 - 8.0   Glucose, UA NEGATIVE NEGATIVE mg/dL   Hgb urine dipstick NEGATIVE NEGATIVE   Bilirubin Urine NEGATIVE NEGATIVE   Ketones, ur NEGATIVE NEGATIVE mg/dL   Protein, ur 100 (A) NEGATIVE mg/dL   Nitrite NEGATIVE NEGATIVE   Leukocytes, UA NEGATIVE NEGATIVE   RBC / HPF 0-5 0 - 5 RBC/hpf   WBC, UA 0-5 0 - 5 WBC/hpf   Bacteria, UA NONE SEEN NONE SEEN   Squamous Epithelial / LPF NONE SEEN NONE SEEN   Mucus PRESENT     Comment: Performed at [facility], [place] [address] [place], [place] [address]  Basic metabolic panel     Status: Abnormal   Collection Time: 12/30/16  6:26 AM  Result Value Ref Range   Sodium 139 135 - 145 mmol/L   Potassium 4.1 3.5 - 5.1 mmol/L   Chloride 103 101 - 111 mmol/L   CO2 29 22 - 32 mmol/L   Glucose, Bld 132 (H) 65 - 99 mg/dL   BUN 14 6 - 20 mg/dL   Creatinine, Ser 0.81 0.61 - 1.24 mg/dL   Calcium 9.1 8.9 - 10.3 mg/dL   GFR calc non Af Amer >60 >60 mL/min   GFR calc Af Amer >60 >60 mL/min    Comment: (NOTE) The eGFR has been calculated using the CKD EPI equation. This calculation has not been validated in all clinical situations. eGFR's persistently <60 mL/min signify possible Chronic Kidney Disease.    Anion gap 7 5 - 15    Comment: Performed at [facility], [place] [address] [place], [place] [address]  Hemoglobin A1c     Status: Abnormal   Collection Time: 12/30/16  6:26 AM  Result Value Ref Range   Hgb A1c MFr Bld 11.3 (H) 4.8 - 5.6 %    Comment: (NOTE) Pre diabetes:          5.7%-6.4% Diabetes:              >6.4% Glycemic control for   <7.0% adults with diabetes    Mean Plasma Glucose 277.61 mg/dL     Comment: Performed at [facility] [address], [place],  [address]  Glucose, capillary     Status: Abnormal   Collection Time: 12/30/16  6:35 AM  Result Value Ref Range   Glucose-Capillary 128 (H) 65 - 99 mg/dL  Glucose, capillary     Status: Abnormal   Collection Time: 12/30/16 12:04 PM  Result Value Ref Range   Glucose-Capillary 434 (H) 65 - 99 mg/dL   Comment 1 Notify RN    Comment 2 Document in Chart     Blood Alcohol level:  Lab Results  Component Value Date   ETH <5 68/01/5725    Metabolic Disorder Labs: Lab Results  Component Value Date   HGBA1C 11.3 (H) 12/30/2016   MPG 277.61 12/30/2016   No results found for: PROLACTIN No results found for: CHOL, TRIG, HDL, CHOLHDL, VLDL, LDLCALC  Physical Findings: AIMS: Facial and Oral Movements Muscles of Facial Expression: None, normal Lips and Perioral Area: None, normal Jaw: None, normal Tongue: None, normal,Extremity Movements Upper (arms, wrists, hands, fingers): None, normal Lower (legs, knees, ankles, toes): None, normal, Trunk Movements Neck, shoulders, hips: None, normal, Overall Severity Severity of  abnormal movements (highest score from questions above): None, normal Incapacitation due to abnormal movements: None, normal Patient's awareness of abnormal movements (rate only patient's report): No Awareness, Dental Status Current problems with teeth and/or dentures?: No Does patient usually wear dentures?: No  CIWA:  CIWA-Ar Total: 0 COWS:     Musculoskeletal: Strength & Muscle Tone: within normal limits Gait & Station: normal Patient leans: N/A  Psychiatric Specialty Exam: Physical Exam  Nursing note and vitals reviewed. Constitutional: He is oriented to person, place, and time. He appears well-developed and well-nourished.  Cardiovascular: Normal rate.   Respiratory: Effort normal.  Musculoskeletal: Normal range of motion.  Neurological: He is alert and oriented to person, place, and  time.  Skin: Skin is warm.    Review of Systems  Constitutional: Negative.   HENT: Negative.   Eyes: Negative.   Respiratory: Negative.   Cardiovascular: Negative.   Gastrointestinal: Negative.   Genitourinary: Negative.   Musculoskeletal: Negative.   Skin: Negative.   Neurological: Negative.   Endo/Heme/Allergies: Negative.     Blood pressure 140/86, pulse 89, temperature 97.6 F (36.4 C), temperature source Oral, resp. rate 16, height 5' 9" (1.753 m), weight 85.3 kg (188 lb).Body mass index is 27.76 kg/m.  General Appearance: Casual  Eye Contact:  Good  Speech:  Clear and Coherent and Normal Rate  Volume:  Normal  Mood:  Depressed  Affect:  Flat  Thought Process:  Coherent and Descriptions of Associations: Intact  Orientation:  Full (Time, Place, and Person)  Thought Content:  WDL  Suicidal Thoughts:  No  Homicidal Thoughts:  No  Memory:  Immediate;   Good Recent;   Good Remote;   Good  Judgement:  Good  Insight:  Good  Psychomotor Activity:  Normal  Concentration:  Concentration: Good and Attention Span: Good  Recall:  Good  Fund of Knowledge:  Good  Language:  Good  Akathisia:  No  Handed:  Right  AIMS (if indicated):     Assets:  Desire for Improvement Financial Resources/Insurance Housing Social Support Transportation  ADL's:  Intact  Cognition:  WNL  Sleep:  Number of Hours: 6.5     Treatment Plan Summary: Daily contact with patient to assess and evaluate symptoms and progress in treatment, Medication management and Plan is to:   -Continue Abilify 5 mg PO Daily for mood stability -Continue Prozac 20 mg PO Daily for mood stability -Continue Vistaril 25 mg PO Q6H PRN for anxiety -Start Mirtazapine 7.5 mg PO QHS for mood and sleep -Continue Trazodone 50 mg PO QHS PRN for insomnia -Encourage group therapy participation  Lewis Shock, FNP 12/30/2016, 2:41 PM  Agree with NP Progress Note

## 2016-12-31 LAB — GLUCOSE, CAPILLARY
Glucose-Capillary: 465 mg/dL — ABNORMAL HIGH (ref 65–99)
Glucose-Capillary: 335 mg/dL — ABNORMAL HIGH (ref 65–99)
Glucose-Capillary: 259 mg/dL — ABNORMAL HIGH (ref 65–99)
Glucose-Capillary: 298 mg/dL — ABNORMAL HIGH (ref 65–99)

## 2016-12-31 NOTE — Plan of Care (Signed)
Problem: Safety: Goal: Periods of time without injury will increase Outcome: Progressing Patient is safe and free from injury.  Denies any withdrawal symptoms.

## 2016-12-31 NOTE — Progress Notes (Signed)
DAR NOTE: Patient presents with calm affect and pleasant mood.  Denies pain, auditory and visual hallucinations.  Rates depression at 4, hopelessness at 0, and anxiety at 0.  Maintained on routine safety checks.  Medications given as prescribed.  Support and encouragement offered as needed.  Attended group and participated.  States goal for today is "acquire healthy coping skills."  Patient visible in milieu for therapy and activities.  Interacting and socializing with staff and peers.

## 2016-12-31 NOTE — Progress Notes (Signed)
[facility] MD Progress Note  12/31/2016 12:34 PM Raymond [name]  MRN:  [id number]   Subjective:  Patient reports that he slept well and that he rates his depression at 4/10 and anxiety at 0/10. He agrees to continue group therapy and participate. He denies any SI/HI/AVH. Even though his depression rating was the same as yesterday he still states improvement.  Objective: Patient's chart and findings reviewed and discussed with treatment team. Patient is gradually improving. Affect is improved today even with complaint of depression. Patient will continue same medication regimen and will consider titrating Prozac up at a later date. Patient has contracted for safety on the unit.  Principal Problem: MDD (major depressive disorder), recurrent severe, without psychosis ([place]) Diagnosis:   Patient Active Problem List   Diagnosis Date Noted  . MDD (major depressive disorder), recurrent severe, without psychosis ([place]) [F33.2] 12/27/2016  . 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 Severe recurrent major depression without psychotic features ([place]) [F33.2] 07/22/2014  . Suicide attempt ([place]) [T14.91XA]    Total Time spent with patient: 15 minutes  Past Psychiatric History: See H&P  Past Medical History:  Past Medical History:  Diagnosis Date  . Diabetes mellitus without complication (HCC)    Type 1  . Gallstones     Past Surgical History:  Procedure Laterality Date  . CHOLECYSTECTOMY     Family History: History reviewed. No pertinent family history. Family Psychiatric  History: See H&P Social History:  History  Alcohol Use  . 0.6 - 1.2 oz/week  . 1 - 2 Cans of beer per week    Comment: few times a week     History  Drug Use  . Types: Other-see comments    Comment: OTC cough medicine    Social History   Social History  . Marital status: Single    Spouse name: N/A  . Number of children: N/A  . Years of education: N/A   Social History Main Topics  . Smoking status: Never  Smoker  . Smokeless tobacco: Current User    Types: Chew  . Alcohol use 0.6 - 1.2 oz/week    1 - 2 Cans of beer per week     Comment: few times a week  . Drug use: Yes    Types: Other-see comments     Comment: OTC cough medicine  . Sexual activity: Yes    Birth control/ protection: None   Other Topics Concern  . None   Social History Narrative  . None   Additional Social History:    Pain Medications: see MAR Prescriptions: see MAR Over the Counter: see MAR History of alcohol / drug use?: Yes Name of Substance 1: alcohol 1 - Age of First Use: UTA 1 - Amount (size/oz): 6-7 cans of beer 1 - Frequency: daily 1 - Duration: UTA 1 - Last Use / Amount: UTA                  Sleep: Good  Appetite:  Good  Current Medications: Current Facility-Administered Medications  Medication Dose Route Frequency Provider Last Rate Last Dose  . acetaminophen (TYLENOL) tablet 650 mg  650 mg Oral Q6H PRN Elmarie Shiley A, NP   650 mg at 12/29/16 0150  . alum & mag hydroxide-simeth (MAALOX/MYLANTA) 200-200-20 MG/5ML suspension 30 mL  30 mL Oral Q4H PRN Elmarie Shiley A, NP      . ARIPiprazole (ABILIFY) tablet 5 mg  5 mg Oral Daily Money, Lowry Ram, FNP   5  mg at 12/31/16 0839  . chlordiazePOXIDE (LIBRIUM) capsule 25 mg  25 mg Oral Q6H PRN Cobos, Myer Peer, MD      . FLUoxetine (PROZAC) capsule 20 mg  20 mg Oral Daily Cobos, Myer Peer, MD   20 mg at 12/31/16 0839  . hydrOXYzine (ATARAX/VISTARIL) tablet 25 mg  25 mg Oral Q6H PRN Cobos, Fernando A, MD      . insulin aspart (novoLOG) injection 0-15 Units  0-15 Units Subcutaneous TID WC Patriciaann Clan E, PA-C   11 Units at 12/31/16 1213  . insulin aspart (novoLOG) injection 0-5 Units  0-5 Units Subcutaneous QHS Niel Hummer, NP   5 Units at 12/30/16 2153  . insulin aspart (novoLOG) injection 4 Units  4 Units Subcutaneous TID WC Patriciaann Clan E, PA-C   4 Units at 12/31/16 1211  . insulin glargine (LANTUS) injection 8 Units  8 Units Subcutaneous  QHS Laverle Hobby, PA-C   8 Units at 12/30/16 2151  . lisinopril (PRINIVIL,ZESTRIL) tablet 5 mg  5 mg Oral Daily Money, Lowry Ram, FNP   5 mg at 12/31/16 7209  . loperamide (IMODIUM) capsule 2-4 mg  2-4 mg Oral PRN Cobos, Myer Peer, MD      . magnesium hydroxide (MILK OF MAGNESIA) suspension 30 mL  30 mL Oral Daily PRN Elmarie Shiley A, NP      . metFORMIN (GLUCOPHAGE) tablet 500 mg  500 mg Oral BID WC Green, Terri L, RPH   500 mg at 12/31/16 0839  . mirtazapine (REMERON) tablet 7.5 mg  7.5 mg Oral QHS Money, Travis B, FNP   7.5 mg at 12/30/16 2143  . multivitamin with minerals tablet 1 tablet  1 tablet Oral Daily Cobos, Myer Peer, MD   1 tablet at 12/31/16 [phone]  . ondansetron (ZOFRAN-ODT) disintegrating tablet 4 mg  4 mg Oral Q6H PRN Cobos, Fernando A, MD      . thiamine (VITAMIN B-1) tablet 100 mg  100 mg Oral Daily Cobos, Myer Peer, MD   100 mg at 12/31/16 0839  . traZODone (DESYREL) tablet 50 mg  50 mg Oral QHS PRN Niel Hummer, NP   50 mg at 12/29/16 2206    Lab Results:  Results for orders placed or performed during the hospital encounter of 12/27/16 (from the past 48 hour(s))  Glucose, capillary     Status: Abnormal   Collection Time: 12/29/16  5:24 PM  Result Value Ref Range   Glucose-Capillary 437 (H) 65 - 99 mg/dL  Glucose, capillary     Status: Abnormal   Collection Time: 12/29/16  9:33 PM  Result Value Ref Range   Glucose-Capillary 412 (H) 65 - 99 mg/dL  Urinalysis, Routine w reflex microscopic     Status: Abnormal   Collection Time: 12/30/16  6:00 AM  Result Value Ref Range   Color, Urine YELLOW YELLOW   APPearance HAZY (A) CLEAR   Specific Gravity, Urine 1.021 1.005 - 1.030   pH 5.0 5.0 - 8.0   Glucose, UA NEGATIVE NEGATIVE mg/dL   Hgb urine dipstick NEGATIVE NEGATIVE   Bilirubin Urine NEGATIVE NEGATIVE   Ketones, ur NEGATIVE NEGATIVE mg/dL   Protein, ur 100 (A) NEGATIVE mg/dL   Nitrite NEGATIVE NEGATIVE   Leukocytes, UA NEGATIVE NEGATIVE   RBC / HPF 0-5 0 - 5  RBC/hpf   WBC, UA 0-5 0 - 5 WBC/hpf   Bacteria, UA NONE SEEN NONE SEEN   Squamous Epithelial / LPF NONE SEEN NONE SEEN  Mucus PRESENT     Comment: Performed at [facility], [place] [address] [place], [place] [address]  Basic metabolic panel     Status: Abnormal   Collection Time: 12/30/16  6:26 AM  Result Value Ref Range   Sodium 139 135 - 145 mmol/L   Potassium 4.1 3.5 - 5.1 mmol/L   Chloride 103 101 - 111 mmol/L   CO2 29 22 - 32 mmol/L   Glucose, Bld 132 (H) 65 - 99 mg/dL   BUN 14 6 - 20 mg/dL   Creatinine, Ser 0.81 0.61 - 1.24 mg/dL   Calcium 9.1 8.9 - 10.3 mg/dL   GFR calc non Af Amer >60 >60 mL/min   GFR calc Af Amer >60 >60 mL/min    Comment: (NOTE) The eGFR has been calculated using the CKD EPI equation. This calculation has not been validated in all clinical situations. eGFR's persistently <60 mL/min signify possible Chronic Kidney Disease.    Anion gap 7 5 - 15    Comment: Performed at [facility], [place] [address], [place], [place] [address]  Hemoglobin A1c     Status: Abnormal   Collection Time: 12/30/16  6:26 AM  Result Value Ref Range   Hgb A1c MFr Bld 11.3 (H) 4.8 - 5.6 %    Comment: (NOTE) Pre diabetes:          5.7%-6.4% Diabetes:              >6.4% Glycemic control for   <7.0% adults with diabetes    Mean Plasma Glucose 277.61 mg/dL    Comment: Performed at [place] [address], [place], [place] [address]  Glucose, capillary     Status: Abnormal   Collection Time: 12/30/16  6:35 AM  Result Value Ref Range   Glucose-Capillary 128 (H) 65 - 99 mg/dL  Glucose, capillary     Status: Abnormal   Collection Time: 12/30/16 12:04 PM  Result Value Ref Range   Glucose-Capillary 434 (H) 65 - 99 mg/dL   Comment 1 Notify RN    Comment 2 Document in Chart   Glucose, capillary     Status: Abnormal   Collection Time: 12/30/16  4:51 PM  Result Value Ref Range   Glucose-Capillary 433 (H) 65 - 99 mg/dL   Comment 1  Notify RN    Comment 2 Document in Chart   Glucose, capillary     Status: Abnormal   Collection Time: 12/30/16  9:39 PM  Result Value Ref Range   Glucose-Capillary 351 (H) 65 - 99 mg/dL  Glucose, capillary     Status: Abnormal   Collection Time: 12/31/16  6:35 AM  Result Value Ref Range   Glucose-Capillary 465 (H) 65 - 99 mg/dL  Glucose, capillary     Status: Abnormal   Collection Time: 12/31/16 12:05 PM  Result Value Ref Range   Glucose-Capillary 335 (H) 65 - 99 mg/dL    Blood Alcohol level:  Lab Results  Component Value Date   ETH <5 21/30/8657    Metabolic Disorder Labs: Lab Results  Component Value Date   HGBA1C 11.3 (H) 12/30/2016   MPG 277.61 12/30/2016   No results found for: PROLACTIN No results found for: CHOL, TRIG, HDL, CHOLHDL, VLDL, LDLCALC  Physical Findings: AIMS: Facial and Oral Movements Muscles of Facial Expression: None, normal Lips and Perioral Area: None, normal Jaw: None, normal Tongue: None, normal,Extremity Movements Upper (arms, wrists, hands, fingers): None, normal Lower (legs, knees, ankles,  toes): None, normal, Trunk Movements Neck, shoulders, hips: None, normal, Overall Severity Severity of abnormal movements (highest score from questions above): None, normal Incapacitation due to abnormal movements: None, normal Patient's awareness of abnormal movements (rate only patient's report): No Awareness, Dental Status Current problems with teeth and/or dentures?: No Does patient usually wear dentures?: No  CIWA:  CIWA-Ar Total: 1 COWS:     Musculoskeletal: Strength & Muscle Tone: within normal limits Gait & Station: normal Patient leans: N/A  Psychiatric Specialty Exam: Physical Exam  Nursing note and vitals reviewed. Constitutional: He is oriented to person, place, and time. He appears well-developed and well-nourished.  Cardiovascular: Normal rate.   Respiratory: Effort normal.  Musculoskeletal: Normal range of motion.  Neurological:  He is alert and oriented to person, place, and time.  Skin: Skin is warm.    Review of Systems  Constitutional: Negative.   HENT: Negative.   Eyes: Negative.   Respiratory: Negative.   Cardiovascular: Negative.   Gastrointestinal: Negative.   Genitourinary: Negative.   Musculoskeletal: Negative.   Skin: Negative.   Neurological: Negative.   Endo/Heme/Allergies: Negative.   Psychiatric/Behavioral: Positive for depression. Negative for hallucinations and suicidal ideas. The patient is not nervous/anxious.     Blood pressure 135/88, pulse 92, temperature 97.6 F (36.4 C), temperature source Oral, resp. rate 20, height _0  (1.753 m), weight 85.3 kg (188 lb).Body mass index is 27.76 kg/m.  General Appearance: Casual  Eye Contact:  Good  Speech:  Clear and Coherent and Normal Rate  Volume:  Normal  Mood:  Depressed  Affect:  Flat  Thought Process:  Coherent and Descriptions of Associations: Intact  Orientation:  Full (Time, Place, and Person)  Thought Content:  WDL  Suicidal Thoughts:  No  Homicidal Thoughts:  No  Memory:  Immediate;   Good Recent;   Good Remote;   Good  Judgement:  Good  Insight:  Good  Psychomotor Activity:  Normal  Concentration:  Concentration: Good and Attention Span: Good  Recall:  Good  Fund of Knowledge:  Good  Language:  Good  Akathisia:  No  Handed:  Right  AIMS (if indicated):     Assets:  Communication Skills Desire for Improvement Financial Resources/Insurance Housing Social Support Transportation  ADL's:  Intact  Cognition:  WNL  Sleep:  Number of Hours: 6.75     Treatment Plan Summary: Daily contact with patient to assess and evaluate symptoms and progress in treatment, Medication management and Plan is to:  -Continue Prozac 20 mg PO Daily for mood stability -Continue Abilify 5 mg PO Daily for mood stability -Continue Vistaril 25 mg PO Q6H PRN for anxiety -Continue Mirtazapine 7.5 mg PO QHS for mood stability Continue Trazodone  50 mg PO QHS PRN for insomnia -Encourage group[ therapy participation   Lewis Shock, FNP 12/31/2016, 12:34 PM   Agree with NP Progress Note

## 2016-12-31 NOTE — Progress Notes (Signed)
Recreation Therapy Notes  Date: 12/31/16 Time: 0930 Location: [address] Dayroom  Group Topic: Stress Management  Goal Area(s) Addresses:  Patient will verbalize importance of using healthy stress management.  Patient will identify positive emotions associated with healthy stress management.   Behavioral Response: Engaged  Intervention: Stress Management  Activity :  Progressive Muscle Relaxation.  LRT introduced the stress management technique of progressive muscle relaxation.  LRT read a script to guide patients through tensing and relaxing each muscle group.  Patients were to follow along as script was read to engage in the activity.  Education:  Stress Management, Discharge Planning.   Education Outcome: Acknowledges edcuation/In group clarification offered/Needs additional education  Clinical Observations/Feedback: Pt attended group.   Caroll Rancher, LRT/CTRS         Lillia Abed, Chandra Asher A 12/31/2016 11:20 AM

## 2016-12-31 NOTE — [facility] Group Notes (Signed)
LCSW Group Therapy Note  12/31/2016 1:15pm  Type of Therapy and Topic:  Group Therapy:  Feelings around Relapse and Recovery  Participation Level:  Minimal   Description of Group:    Patients in this group will discuss emotions they experience before and after a relapse. They will process how experiencing these feelings, or avoidance of experiencing them, relates to having a relapse. Facilitator will guide patients to explore emotions they have related to recovery. Patients will be encouraged to process which emotions are more powerful. They will be guided to discuss the emotional reaction significant others in their lives may have to their relapse or recovery. Patients will be assisted in exploring ways to respond to the emotions of others without this contributing to a relapse.  Therapeutic Goals: 1. Patient will identify two or more emotions that lead to a relapse for them 2. Patient will identify two emotions that result when they relapse 3. Patient will identify two emotions related to recovery 4. Patient will demonstrate ability to communicate their needs through discussion and/or role plays   Summary of Patient Progress: Pt participated minimally in discussion but was attentive throughout.    Therapeutic Modalities:   Cognitive Behavioral Therapy Solution-Focused Therapy Assertiveness Training Relapse Prevention Therapy   Verdene Lennert, LCSW 12/31/2016 3:04 PM

## 2016-12-31 NOTE — Progress Notes (Signed)
Adult Psychoeducational Group Note  Date:  12/31/2016 Time:  11:33 PM  Group Topic/Focus:  Wrap-Up Group:   The focus of this group is to help patients review their daily goal of treatment and discuss progress on daily workbooks.  Participation Level:  Active  Participation Quality:  Appropriate  Affect:  Appropriate  Cognitive:  Appropriate  Insight: Appropriate  Engagement in Group:  Engaged  Modes of Intervention:  Discussion  Additional Comments: Pt stated that his goal was not to let things bother him and watching television and talking with peers was something positive for him  Kaydra Borgen A 12/31/2016, 11:33 PM

## 2016-12-31 NOTE — Progress Notes (Signed)
Inpatient Diabetes Program Recommendations  AACE/ADA: New Consensus Statement on Inpatient Glycemic Control (2015)  Target Ranges:  Prepandial:   less than 140 mg/dL      Peak postprandial:   less than 180 mg/dL (1-2 hours)      Critically ill patients:  140 - 180 mg/dL   Lab Results  Component Value Date   GLUCAP 465 (H) 12/31/2016   HGBA1C 11.3 (H) 12/30/2016    Review of Glycemic Control  Diabetes history: DM  Outpatient Diabetes medications:  70/30 25 units BID (Equivalent of 35 units long acting portion and 15 units of short acting portion) Current orders for Inpatient glycemic control:   Lantus 8 units, Metformin 500 mg BID, Novolog Moderate 0-15 units tid + Novolog 4 units meal coverage tid + novolog HS scale 0-5 units  Inpatient Diabetes Program Recommendations:    Consider increasing Lantus closer to home dose equivalent in 70/30 insulin, Lantus 25 units.  Thanks,  Christena Deem RN, MSN, [facility] Inpatient Diabetes Coordinator Team Pager [phone] (8a-5p)

## 2016-12-31 NOTE — Progress Notes (Signed)
Patient ID: Raymond [name], male   DOB: August 31, 1985, 31 y.o.   MRN: [id number]  NP notified of pt elevated blood sugar. New orders to give 15 units of novolog and 4 unit novolog meal coverage given.

## 2016-12-31 NOTE — Progress Notes (Signed)
Pt attend wrap up group. His day was a 8. He came to establish better coping merchaism.

## 2017-01-01 LAB — GLUCOSE, CAPILLARY
Glucose-Capillary: 380 mg/dL — ABNORMAL HIGH (ref 65–99)
Glucose-Capillary: 135 mg/dL — ABNORMAL HIGH (ref 65–99)
Glucose-Capillary: 346 mg/dL — ABNORMAL HIGH (ref 65–99)
Glucose-Capillary: 280 mg/dL — ABNORMAL HIGH (ref 65–99)

## 2017-01-01 MED ORDER — INSULIN GLARGINE 100 UNIT/ML ~~LOC~~ SOLN
25.0000 [IU] | Freq: Every day | SUBCUTANEOUS | Status: DC
  Administered 2017-01-01 – 2017-01-02 (×2): 25 [IU] via SUBCUTANEOUS
  Filled 2017-01-01: qty 0.25

## 2017-01-01 NOTE — Progress Notes (Signed)
D- Report was received from Brook, RN.  Per report, patient denied SI, HI, Auditory hallucinations, Visual hallucinations, pain and nighttime medications were given without issue.  Patient is currently observed resting quietly in bed, respirations even and unlabored, color satisfactory. No complaints.  A: Scheduled medications administered to patient, per MD orders. Routine safety checks conducted every 15 minutes. R: Safety maintained on unit. 

## 2017-01-01 NOTE — Progress Notes (Signed)
[facility] MD Progress Note  01/01/2017 2:59 PM Raymond [name]  MRN:  [id number]   Subjective: Raymond [name] reports " I am starting to feeling better, I was feeling down and depressed like I failed as a father because my baby could have been hurt bad."   Objective: [name] is awake, alert and oriented. Seen resting in dayroom interacting with peers. Denies suicidal or homicidal ideation. Denies auditory or visual hallucination and does not appear to be responding to internal stimuli.  Patient reports he is medication compliant without mediation side effects.States his depression 5/10. Reports good appetite and reports he is resting well. Support, encouragement and reassurance was provided.   Principal Problem: MDD (major depressive disorder), recurrent severe, without psychosis (HCC) Diagnosis:   Patient Active Problem List   Diagnosis Date Noted  . MDD (major depressive disorder), recurrent severe, without psychosis (HCC) [F33.2] 12/27/2016  . 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 Severe recurrent major depression without psychotic features (HCC) [F33.2] 07/22/2014  . Suicide attempt (HCC) [T14.91XA]    Total Time spent with patient: 15 minutes  Past Psychiatric History: See H&P  Past Medical History:  Past Medical History:  Diagnosis Date  . Diabetes mellitus without complication (HCC)    Type 1  . Gallstones     Past Surgical History:  Procedure Laterality Date  . CHOLECYSTECTOMY     Family History: History reviewed. No pertinent family history. Family Psychiatric  History: See H&P Social History:  History  Alcohol Use  . 0.6 - 1.2 oz/week  . 1 - 2 Cans of beer per week    Comment: few times a week     History  Drug Use  . Types: Other-see comments    Comment: OTC cough medicine    Social History   Social History  . Marital status: Single    Spouse name: N/A  . Number of children: N/A  . Years of education: N/A   Social History Main Topics  .  Smoking status: Never Smoker  . Smokeless tobacco: Current User    Types: Chew  . Alcohol use 0.6 - 1.2 oz/week    1 - 2 Cans of beer per week     Comment: few times a week  . Drug use: Yes    Types: Other-see comments     Comment: OTC cough medicine  . Sexual activity: Yes    Birth control/ protection: None   Other Topics Concern  . None   Social History Narrative  . None   Additional Social History:    Pain Medications: see MAR Prescriptions: see MAR Over the Counter: see MAR History of alcohol / drug use?: Yes Name of Substance 1: alcohol 1 - Age of First Use: UTA 1 - Amount (size/oz): 6-7 cans of beer 1 - Frequency: daily 1 - Duration: UTA 1 - Last Use / Amount: UTA                  Sleep: Good  Appetite:  Good  Current Medications: Current Facility-Administered Medications  Medication Dose Route Frequency Provider Last Rate Last Dose  . acetaminophen (TYLENOL) tablet 650 mg  650 mg Oral Q6H PRN Fransisca Kaufmann A, NP   650 mg at 12/29/16 0150  . alum & mag hydroxide-simeth (MAALOX/MYLANTA) 200-200-20 MG/5ML suspension 30 mL  30 mL Oral Q4H PRN Fransisca Kaufmann A, NP   30 mL at 01/01/17 0904  . ARIPiprazole (ABILIFY) tablet 5 mg  5 mg Oral Daily Money,  Gerlene Burdock, FNP   5 mg at 01/01/17 0800  . FLUoxetine (PROZAC) capsule 20 mg  20 mg Oral Daily Cobos, Rockey Situ, MD   20 mg at 01/01/17 0800  . insulin aspart (novoLOG) injection 0-15 Units  0-15 Units Subcutaneous TID WC Donell Sievert E, PA-C   2 Units at 01/01/17 1212  . insulin aspart (novoLOG) injection 0-5 Units  0-5 Units Subcutaneous QHS Thermon Leyland, NP   3 Units at 12/31/16 2053  . insulin aspart (novoLOG) injection 4 Units  4 Units Subcutaneous TID WC Donell Sievert E, PA-C   4 Units at 01/01/17 1212  . insulin glargine (LANTUS) injection 25 Units  25 Units Subcutaneous QHS Oneta Rack, NP      . lisinopril (PRINIVIL,ZESTRIL) tablet 5 mg  5 mg Oral Daily Money, Travis B, FNP   5 mg at 01/01/17 0800  .  magnesium hydroxide (MILK OF MAGNESIA) suspension 30 mL  30 mL Oral Daily PRN Fransisca Kaufmann A, NP      . metFORMIN (GLUCOPHAGE) tablet 500 mg  500 mg Oral BID WC Green, Terri L, RPH   500 mg at 01/01/17 0800  . mirtazapine (REMERON) tablet 7.5 mg  7.5 mg Oral QHS Money, Travis B, FNP   7.5 mg at 12/31/16 2203  . multivitamin with minerals tablet 1 tablet  1 tablet Oral Daily Cobos, Rockey Situ, MD   1 tablet at 01/01/17 0800  . thiamine (VITAMIN B-1) tablet 100 mg  100 mg Oral Daily Cobos, Fernando A, MD   100 mg at 01/01/17 0800  . traZODone (DESYREL) tablet 50 mg  50 mg Oral QHS PRN Thermon Leyland, NP   50 mg at 12/31/16 2203    Lab Results:  Results for orders placed or performed during the hospital encounter of 12/27/16 (from the past 48 hour(s))  Glucose, capillary     Status: Abnormal   Collection Time: 12/30/16  4:51 PM  Result Value Ref Range   Glucose-Capillary 433 (H) 65 - 99 mg/dL   Comment 1 Notify RN    Comment 2 Document in Chart   Glucose, capillary     Status: Abnormal   Collection Time: 12/30/16  9:39 PM  Result Value Ref Range   Glucose-Capillary 351 (H) 65 - 99 mg/dL  Glucose, capillary     Status: Abnormal   Collection Time: 12/31/16  6:35 AM  Result Value Ref Range   Glucose-Capillary 465 (H) 65 - 99 mg/dL  Glucose, capillary     Status: Abnormal   Collection Time: 12/31/16 12:05 PM  Result Value Ref Range   Glucose-Capillary 335 (H) 65 - 99 mg/dL  Glucose, capillary     Status: Abnormal   Collection Time: 12/31/16  5:30 PM  Result Value Ref Range   Glucose-Capillary 259 (H) 65 - 99 mg/dL  Glucose, capillary     Status: Abnormal   Collection Time: 12/31/16  8:38 PM  Result Value Ref Range   Glucose-Capillary 298 (H) 65 - 99 mg/dL  Glucose, capillary     Status: Abnormal   Collection Time: 01/01/17  6:41 AM  Result Value Ref Range   Glucose-Capillary 380 (H) 65 - 99 mg/dL  Glucose, capillary     Status: Abnormal   Collection Time: 01/01/17 11:58 AM  Result  Value Ref Range   Glucose-Capillary 135 (H) 65 - 99 mg/dL   Comment 1 Notify RN    Comment 2 Document in Chart     Blood Alcohol  level:  Lab Results  Component Value Date   ETH <5 07/21/2014    Metabolic Disorder Labs: Lab Results  Component Value Date   HGBA1C 11.3 (H) 12/30/2016   MPG 277.61 12/30/2016   No results found for: PROLACTIN No results found for: CHOL, TRIG, HDL, CHOLHDL, VLDL, LDLCALC  Physical Findings: AIMS: Facial and Oral Movements Muscles of Facial Expression: None, normal Lips and Perioral Area: None, normal Jaw: None, normal Tongue: None, normal,Extremity Movements Upper (arms, wrists, hands, fingers): None, normal Lower (legs, knees, ankles, toes): None, normal, Trunk Movements Neck, shoulders, hips: None, normal, Overall Severity Severity of abnormal movements (highest score from questions above): None, normal Incapacitation due to abnormal movements: None, normal Patient's awareness of abnormal movements (rate only patient's report): No Awareness, Dental Status Current problems with teeth and/or dentures?: No Does patient usually wear dentures?: No  CIWA:  CIWA-Ar Total: 0 COWS:     Musculoskeletal: Strength & Muscle Tone: within normal limits Gait & Station: normal Patient leans: N/A  Psychiatric Specialty Exam: Physical Exam  Nursing note and vitals reviewed. Constitutional: He is oriented to person, place, and time. He appears well-developed and well-nourished.  Cardiovascular: Normal rate.   Respiratory: Effort normal.  Musculoskeletal: Normal range of motion.  Neurological: He is alert and oriented to person, place, and time.  Skin: Skin is warm.    Review of Systems  Psychiatric/Behavioral: Positive for depression (improving ). Negative for hallucinations and suicidal ideas. The patient is not nervous/anxious.     Blood pressure 128/84, pulse 98, temperature 97.9 F (36.6 C), resp. rate 18, height  (1.753 m), weight 85.3 kg  (188 lb).Body mass index is 27.76 kg/m.  General Appearance: Casual and Well Groomed  Eye Contact:  Good  Speech:  Clear and Coherent  Volume:  Normal  Mood:  Depressed  Affect:  Flat  Thought Process:  Coherent  Orientation:  Full (Time, Place, and Person)  Thought Content:  WDL  Suicidal Thoughts:  No  Homicidal Thoughts:  No  Memory:  Immediate;   Good Recent;   Good Remote;   Good  Judgement:  Good  Insight:  Good  Psychomotor Activity:  Normal  Concentration:  Concentration: Good and Attention Span: Good  Recall:  Good  Fund of Knowledge:  Good  Language:  Good  Akathisia:  No  Handed:  Right  AIMS (if indicated):     Assets:  Desire for Improvement Social Support  ADL's:  Intact  Cognition:  WNL  Sleep:  Number of Hours: 5.75     I agree with current treatment plan on 01/01/2017, Patient seen face-to-face for psychiatric evaluation follow-up, chart reviewed.  Reviewed the information documented and agree with the treatment plan.  Treatment Plan Summary: Daily contact with patient to assess and evaluate symptoms and progress in treatment and Medication management   Continue with current treatment plan on 01/01/2017 except wherenoted   -Continue Prozac 20 mg PO Daily for mood stability -Continue Abilify 5 mg PO Daily for mood stability -Continue Vistaril 25 mg PO Q6H PRN for anxiety -Continue Mirtazapine 7.5 mg PO QHS for mood stability Continue Trazodone 50 mg PO QHS PRN for insomnia -Encourage group[ therapy participation   Oneta Rack, NP 01/01/2017, 2:59 PM

## 2017-01-01 NOTE — [facility] Group Notes (Signed)
[facility] LCSW Group Therapy Note  01/01/2017 @ 10:15 to 11:15 AM  Type of Therapy and Topic:  Group Therapy: Avoiding Self-Sabotaging and Enabling Behaviors  Participation Level:  Did Not Attend; despite encouragement from CSW and MHT who went room to room before group.    Description of Group The main focus of today's process group to discuss what "self-sabotage" means and use motivational iInterviewing to discuss what benefits, negative or positive, were involved in a self-identified self-sabotaging behavior. We then talked about reasons the patient may want to change the behavior and their current desire to change.  Bach Rocchi C Kaylie Ritter, LCSW 

## 2017-01-01 NOTE — Progress Notes (Signed)
D: Pt presents with a flat affect and an anxious mood. Pt rates depression 3/10. Pt denies SI. Pt reports improved sleep last night after taking bedtime meds. Pt reported feeling groggy this morning from bedtime meds. Pt verbalized readiness to discharge today. Pt stated that he plans to go live with his brother and sister in law for a week. A: Medications reviewed with pt. Medications administered as ordered per MD. Verbal support provided. Pt encouraged to attend groups. 15 minute checks performed for safety. R: Pt compliant with tx.

## 2017-01-01 NOTE — [facility] Group Notes (Signed)
Idnetifying Needs   Date:  01/01/2017  Time:  1100  Type of Therapy:  Nurse Education  /  The group focuses on teaching patients how to identify their needs and then how to develop the skills needed to get them met.  Participation Level:  Active  Participation Quality:  Attentive  Affect:  Appropriate  Cognitive:  Alert  Insight:  Good  Engagement in Group:  Engaged  Modes of Intervention:  Education  Summary of Progress/Problems:  ,  Lynn 01/01/2017, 1:14 PM 

## 2017-01-01 NOTE — Progress Notes (Signed)
Patient attended group and said that his day was a  9. His goal for today was to go home.  Although he did not go home today, patient said he will be leaving tomorrow.

## 2017-01-02 LAB — GLUCOSE, CAPILLARY
Glucose-Capillary: 116 mg/dL — ABNORMAL HIGH (ref 65–99)
Glucose-Capillary: 370 mg/dL — ABNORMAL HIGH (ref 65–99)
Glucose-Capillary: 456 mg/dL — ABNORMAL HIGH (ref 65–99)
Glucose-Capillary: 123 mg/dL — ABNORMAL HIGH (ref 65–99)

## 2017-01-02 NOTE — [facility] Group Notes (Signed)
[facility] Group Note- Nursing Education Group [id number]  Today's group patient's were asked to introduce themselves, why they are here, and one thing they would like to learn about.  A list of the topics/questions was created on the marker board.  The nurse answered the questions as appropriate with education, or referred the patient to the proper discipline to address his or her question.  Topics discussed include mental health diagnoses (bipolar I & II, depression, schizophrenia, and schizoaffective disorders), and various other topics.  Patient attended for duration of group and actively participated. 

## 2017-01-02 NOTE — Progress Notes (Signed)
D Patient seen socializing with peers in the dayroom this morning after breakfast. He did not appear at the med window ( when hall announcement was made) . He is cooperative, amicable and pleasant this am when he takes his mornign medications. HE admits that " I feel a lot better" today and he shares with Clinical research associate that he might get to go home today. R A He completed his daily assessment and on this he wrote he denied SI today and he rated his depression, hopelessness and anxeity " 2/0/0", respectively. R Safety in palce

## 2017-01-02 NOTE — Progress Notes (Signed)
Nursing Progress Note: 7p-7a D: Pt currently presents with a pleasant/animated affect and behavior. Pt states "I had the most wonderful day. I'm really ready to go home. I feel much better." Interacting appropriately with the milieu. Pt reports good sleep during the previous night with current medication regimen. Pt did attend wrap-up group.  A: Pt provided with medications per providers orders. Pt's labs and vitals were monitored throughout the night. Pt supported emotionally and encouraged to express concerns and questions. Pt educated on medications.  R: Pt's safety ensured with 15 minute and environmental checks. Pt currently denies SI, HI, and AVH. Pt verbally contracts to seek staff if SI,HI, or AVH occurs and to consult with staff before acting on any harmful thoughts. Will continue to monitor.

## 2017-01-02 NOTE — Progress Notes (Signed)
Pt in dayroom interacting with other patients.  Makes good eye contact and is pleasant.  Pt BS= 280 with 3u coverage (novoLog) as well as 25 units night time coverage (Lantus). Pt is med compliant and looking forward to DC.  Pt attends group and participates.  Pt denies SI, HI and AVH.  Pt contracts for safety verbally.   15 min checks continue for pt safety. Pt remains safe on unit.

## 2017-01-02 NOTE — Progress Notes (Signed)
Patient attended group and said that his day was a 9.  His coping skills for today were socializing, going outside and watching football.

## 2017-01-02 NOTE — Progress Notes (Signed)
[facility] MD Progress Note  01/02/2017 12:44 PM Raymond [name]  MRN:  [id number]   Subjective:  Patient reports that he is feeling great today. "My depression is at about a 3 out of 10 and no anxiety today. I haven't felt this good ion a long time."  Objective: Patient's chart and findings reviewed and discussed with treatment team. Patient is pleasant and cooperative. He has been seen in the day room interacting with others appropriately. He has been participating in group. He denied SI/HI/AVH. He requested to discharge today and was informed maybe tomorrow.  Principal Problem: MDD (major depressive disorder), recurrent severe, without psychosis (HCC) Diagnosis:   Patient Active Problem List   Diagnosis Date Noted  . MDD (major depressive disorder), recurrent severe, without psychosis (HCC) [F33.2] 12/27/2016  . 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 Severe recurrent major depression without psychotic features (HCC) [F33.2] 07/22/2014  . Suicide attempt (HCC) [T14.91XA]    Total Time spent with patient: 15 minutes  Past Psychiatric History: See H&P  Past Medical History:  Past Medical History:  Diagnosis Date  . Diabetes mellitus without complication (HCC)    Type 1  . Gallstones     Past Surgical History:  Procedure Laterality Date  . CHOLECYSTECTOMY     Family History: History reviewed. No pertinent family history. Family Psychiatric  History: See H&P Social History:  History  Alcohol Use  . 0.6 - 1.2 oz/week  . 1 - 2 Cans of beer per week    Comment: few times a week     History  Drug Use  . Types: Other-see comments    Comment: OTC cough medicine    Social History   Social History  . Marital status: Single    Spouse name: N/A  . Number of children: N/A  . Years of education: N/A   Social History Main Topics  . Smoking status: Never Smoker  . Smokeless tobacco: Current User    Types: Chew  . Alcohol use 0.6 - 1.2 oz/week    1 - 2 Cans of beer per  week     Comment: few times a week  . Drug use: Yes    Types: Other-see comments     Comment: OTC cough medicine  . Sexual activity: Yes    Birth control/ protection: None   Other Topics Concern  . None   Social History Narrative  . None   Additional Social History:    Pain Medications: see MAR Prescriptions: see MAR Over the Counter: see MAR History of alcohol / drug use?: Yes Name of Substance 1: alcohol 1 - Age of First Use: UTA 1 - Amount (size/oz): 6-7 cans of beer 1 - Frequency: daily 1 - Duration: UTA 1 - Last Use / Amount: UTA                  Sleep: Good  Appetite:  Good  Current Medications: Current Facility-Administered Medications  Medication Dose Route Frequency Provider Last Rate Last Dose  . acetaminophen (TYLENOL) tablet 650 mg  650 mg Oral Q6H PRN Fransisca Kaufmann A, NP   650 mg at 12/29/16 0150  . alum & mag hydroxide-simeth (MAALOX/MYLANTA) 200-200-20 MG/5ML suspension 30 mL  30 mL Oral Q4H PRN Fransisca Kaufmann A, NP   30 mL at 01/01/17 0904  . ARIPiprazole (ABILIFY) tablet 5 mg  5 mg Oral Daily Money, Gerlene Burdock, FNP   5 mg at 01/02/17 0853  . FLUoxetine (PROZAC) capsule 20 mg  20 mg Oral Daily Cobos, Rockey Situ, MD   20 mg at 01/02/17 0853  . insulin aspart (novoLOG) injection 0-15 Units  0-15 Units Subcutaneous TID WC Kerry Hough, PA-C   15 Units at 01/02/17 1215  . insulin aspart (novoLOG) injection 0-5 Units  0-5 Units Subcutaneous QHS Thermon Leyland, NP   3 Units at 01/01/17 2132  . insulin aspart (novoLOG) injection 4 Units  4 Units Subcutaneous TID WC Donell Sievert E, PA-C   4 Units at 01/02/17 1215  . insulin glargine (LANTUS) injection 25 Units  25 Units Subcutaneous QHS Oneta Rack, NP   25 Units at 01/01/17 2132  . lisinopril (PRINIVIL,ZESTRIL) tablet 5 mg  5 mg Oral Daily Money, Gerlene Burdock, FNP   5 mg at 01/02/17 0853  . magnesium hydroxide (MILK OF MAGNESIA) suspension 30 mL  30 mL Oral Daily PRN Fransisca Kaufmann A, NP      . metFORMIN  (GLUCOPHAGE) tablet 500 mg  500 mg Oral BID WC Green, Terri L, RPH   500 mg at 01/02/17 0853  . mirtazapine (REMERON) tablet 7.5 mg  7.5 mg Oral QHS Money, Travis B, FNP   7.5 mg at 12/31/16 2203  . multivitamin with minerals tablet 1 tablet  1 tablet Oral Daily Cobos, Rockey Situ, MD   1 tablet at 01/02/17 0853  . thiamine (VITAMIN B-1) tablet 100 mg  100 mg Oral Daily Cobos, Rockey Situ, MD   100 mg at 01/02/17 0853  . traZODone (DESYREL) tablet 50 mg  50 mg Oral QHS PRN Thermon Leyland, NP   50 mg at 01/01/17 2135    Lab Results:  Results for orders placed or performed during the hospital encounter of 12/27/16 (from the past 48 hour(s))  Glucose, capillary     Status: Abnormal   Collection Time: 12/31/16  5:30 PM  Result Value Ref Range   Glucose-Capillary 259 (H) 65 - 99 mg/dL  Glucose, capillary     Status: Abnormal   Collection Time: 12/31/16  8:38 PM  Result Value Ref Range   Glucose-Capillary 298 (H) 65 - 99 mg/dL  Glucose, capillary     Status: Abnormal   Collection Time: 01/01/17  6:41 AM  Result Value Ref Range   Glucose-Capillary 380 (H) 65 - 99 mg/dL  Glucose, capillary     Status: Abnormal   Collection Time: 01/01/17 11:58 AM  Result Value Ref Range   Glucose-Capillary 135 (H) 65 - 99 mg/dL   Comment 1 Notify RN    Comment 2 Document in Chart   Glucose, capillary     Status: Abnormal   Collection Time: 01/01/17  5:05 PM  Result Value Ref Range   Glucose-Capillary 346 (H) 65 - 99 mg/dL  Glucose, capillary     Status: Abnormal   Collection Time: 01/01/17  8:54 PM  Result Value Ref Range   Glucose-Capillary 280 (H) 65 - 99 mg/dL  Glucose, capillary     Status: Abnormal   Collection Time: 01/02/17  6:14 AM  Result Value Ref Range   Glucose-Capillary 116 (H) 65 - 99 mg/dL  Glucose, capillary     Status: Abnormal   Collection Time: 01/02/17 11:30 AM  Result Value Ref Range   Glucose-Capillary 370 (H) 65 - 99 mg/dL    Blood Alcohol level:  Lab Results  Component  Value Date   ETH <5 07/21/2014    Metabolic Disorder Labs: Lab Results  Component Value Date   HGBA1C 11.3 (H)  12/30/2016   MPG 277.61 12/30/2016   No results found for: PROLACTIN No results found for: CHOL, TRIG, HDL, CHOLHDL, VLDL, LDLCALC  Physical Findings: AIMS: Facial and Oral Movements Muscles of Facial Expression: None, normal Lips and Perioral Area: None, normal Jaw: None, normal Tongue: None, normal,Extremity Movements Upper (arms, wrists, hands, fingers): None, normal Lower (legs, knees, ankles, toes): None, normal, Trunk Movements Neck, shoulders, hips: None, normal, Overall Severity Severity of abnormal movements (highest score from questions above): None, normal Incapacitation due to abnormal movements: None, normal Patient's awareness of abnormal movements (rate only patient's report): No Awareness, Dental Status Current problems with teeth and/or dentures?: No Does patient usually wear dentures?: No  CIWA:  CIWA-Ar Total: 0 COWS:     Musculoskeletal: Strength & Muscle Tone: within normal limits Gait & Station: normal Patient leans: N/A  Psychiatric Specialty Exam: Physical Exam  Nursing note and vitals reviewed. Constitutional: He is oriented to person, place, and time. He appears well-developed and well-nourished.  Cardiovascular: Normal rate.   Respiratory: Effort normal.  Musculoskeletal: Normal range of motion.  Neurological: He is alert and oriented to person, place, and time.  Skin: Skin is warm.    Review of Systems  Constitutional: Negative.   HENT: Negative.   Eyes: Negative.   Respiratory: Negative.   Cardiovascular: Negative.   Gastrointestinal: Negative.   Genitourinary: Negative.   Musculoskeletal: Negative.   Skin: Negative.   Neurological: Negative.   Endo/Heme/Allergies: Negative.   Psychiatric/Behavioral: Positive for depression (improving).    Blood pressure 128/85, pulse 93, temperature 98 F (36.7 C), temperature source  Oral, resp. rate 18, height  (1.753 m), weight 85.3 kg (188 lb).Body mass index is 27.76 kg/m.  General Appearance: Casual  Eye Contact:  Good  Speech:  Clear and Coherent and Normal Rate  Volume:  Normal  Mood:  Depressed  Affect:  Flat  Thought Process:  Coherent and Descriptions of Associations: Intact  Orientation:  Full (Time, Place, and Person)  Thought Content:  WDL  Suicidal Thoughts:  No  Homicidal Thoughts:  No  Memory:  Immediate;   Good Recent;   Good Remote;   Good  Judgement:  Good  Insight:  Good  Psychomotor Activity:  Normal  Concentration:  Concentration: Good and Attention Span: Good  Recall:  Good  Fund of Knowledge:  Good  Language:  Good  Akathisia:  No  Handed:  Right  AIMS (if indicated):     Assets:  Communication Skills Desire for Improvement Financial Resources/Insurance Housing Social Support Transportation  ADL's:  Intact  Cognition:  WNL  Sleep:  Number of Hours: 6     Treatment Plan Summary: Daily contact with patient to assess and evaluate symptoms and progress in treatment, Medication management and Plan is to:  -Continue Abilify 5 mg PO Daily for mood stability -Continue Prozac 20 mg PO Daily for mood control -Continue Mirtazapine 7.5 mg PO QHS for mood stability -Continue Trazodone 50 mg PO QHS PRN for insomnia -Encourage group therapy participation -Possible discharge tomorrow  Maryfrances Bunnell, FNP 01/02/2017, 12:44 PM  Agree with NP Progress Note

## 2017-01-03 DIAGNOSIS — E119 Type 2 diabetes mellitus without complications: Secondary | ICD-10-CM

## 2017-01-03 DIAGNOSIS — T483X2A Poisoning by antitussives, intentional self-harm, initial encounter: Secondary | ICD-10-CM

## 2017-01-03 DIAGNOSIS — Z794 Long term (current) use of insulin: Secondary | ICD-10-CM

## 2017-01-03 LAB — GLUCOSE, CAPILLARY
Glucose-Capillary: 481 mg/dL — ABNORMAL HIGH (ref 65–99)
Glucose-Capillary: 221 mg/dL — ABNORMAL HIGH (ref 65–99)

## 2017-01-03 MED ORDER — LISINOPRIL 5 MG PO TABS: 5.0000 mg | ORAL_TABLET | Freq: Every day | ORAL | 0 refills | Status: DC

## 2017-01-03 MED ORDER — FLUOXETINE HCL 20 MG PO CAPS: 20.0000 mg | ORAL_CAPSULE | Freq: Every day | ORAL | 0 refills | Status: DC

## 2017-01-03 MED ORDER — INSULIN GLARGINE 100 UNIT/ML ~~LOC~~ SOLN: 25.0000 [IU] | Freq: Every day | SUBCUTANEOUS | 0 refills | Status: DC

## 2017-01-03 MED ORDER — TRAZODONE HCL 50 MG PO TABS: 50.0000 mg | ORAL_TABLET | Freq: Every evening | ORAL | 0 refills | Status: DC | PRN

## 2017-01-03 MED ORDER — METFORMIN HCL 500 MG PO TABS: 500.0000 mg | ORAL_TABLET | Freq: Two times a day (BID) | ORAL | 0 refills | Status: DC

## 2017-01-03 MED ORDER — INSULIN ASPART 100 UNIT/ML ~~LOC~~ SOLN
2.0000 [IU] | Freq: Once | SUBCUTANEOUS | Status: AC
  Administered 2017-01-03: 2 [IU] via SUBCUTANEOUS

## 2017-01-03 MED ORDER — INSULIN ASPART 100 UNIT/ML ~~LOC~~ SOLN: 4.0000 [IU] | Freq: Three times a day (TID) | SUBCUTANEOUS | 0 refills | Status: DC

## 2017-01-03 MED ORDER — MIRTAZAPINE 7.5 MG PO TABS
7.5000 mg | ORAL_TABLET | Freq: Every day | ORAL | 0 refills | Status: DC
Start: 2017-01-03 — End: 2018-10-07

## 2017-01-03 MED ORDER — ARIPIPRAZOLE 5 MG PO TABS: 5.0000 mg | ORAL_TABLET | Freq: Every day | ORAL | 0 refills | Status: DC

## 2017-01-03 NOTE — BHH Suicide Risk Assessment (Signed)
[facility] Discharge Suicide Risk Assessment   Principal Problem: MDD (major depressive disorder), recurrent severe, without psychosis (HCC) Discharge Diagnoses:  Patient Active Problem List   Diagnosis Date Noted  . MDD (major depressive disorder), recurrent severe, without psychosis (HCC) [F33.2] 12/27/2016  . 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 Severe recurrent major depression without psychotic features (HCC) [F33.2] 07/22/2014  . Suicide attempt (HCC) [T14.91XA]     Total Time spent with patient: 30 minutes  Musculoskeletal: Strength & Muscle Tone: within normal limits Gait & Station: normal Patient leans: N/A  Psychiatric Specialty Exam: ROS no headache, no chest pain, no shortness of breath, no vomiting , no nausea   Blood pressure 125/80, pulse 85, temperature 98.7 F (37.1 C), temperature source Oral, resp. rate 20, height  (1.753 m), weight 85.3 kg (188 lb).Body mass index is 27.76 kg/m.  General Appearance: improved grooming   Eye Contact::  Good  Speech:  Normal Rate409  Volume:  Normal  Mood:  improved mood, denies feeling depressed at this time  Affect:  appropriate, more reactive   Thought Process:  Linear and Descriptions of Associations: Intact  Orientation:  Full (Time, Place, and Person)  Thought Content:  denies hallucinations, no delusions, not internally preoccupied   Suicidal Thoughts:  No denies any suicidal or self injurious ideations, denies homicidal or violent ideations  Homicidal Thoughts:  No  Memory:  recent and remote grossly intact   Judgement:  Other:  improving  Insight:  improving   Psychomotor Activity:  Normal  Concentration:  Good  Recall:  Good  Fund of Knowledge:Good  Language: Good  Akathisia:  Negative  Handed:  Right  AIMS (if indicated):     Assets:  Communication Skills Desire for Improvement Resilience  Sleep:  Number of Hours: 6  Cognition: WNL  ADL's:  Intact   Mental Status Per Nursing Assessment::    On Admission:     Demographic Factors:  31 year old male , separated, has a daughter who lives with the mother, patient lives alone   Loss Factors: Daughter had recently stepped on a syringe , chronic medical illness ( DM)  Historical Factors: History of depression, history of alcohol abuse, history of dextromethorphan abuse . History of DM   Risk Reduction Factors:   Positive coping skills or problem solving skills  Continued Clinical Symptoms:  At this time patient is alert, attentive , well related, pleasant, mood is described as improved, and affect is full in range. No thought disorder, no suicidal or self injurious ideations, no homicidal or violent ideations , no hallucinations, no delusions, not internally preoccupied . Behavior on unit in good control. Pleasant on approach Denies medication side effects.   Cognitive Features That Contribute To Risk:  No gross cognitive deficits noted upon discharge. Is alert , attentive, and oriented x 3   Suicide Risk:  Mild   Follow-up Information    Inc, [facility] Follow up on 01/05/2017.   Why:  at 9:00am for your hospital discharge appointment.  Contact information: [address] [place] [address] [phone]        Medicine, [facility] Follow up on 01/07/2017.   Specialty:  Behavioral Health Why:  at 3:00pm for your initial assessment for therapy with Bren. Please arrive 30 minutes early to complete paperwork. Contact information: [address] [place] [address] [phone]           Plan Of Care/Follow-up recommendations:  Activity:  as tolerated  Diet:  Diabetic  Tests:  NA Other:  See below Patient is leaving unit in good spirits Plans to return home Patient to follow up as above, and expresses he is also going to establish treatment with a PCP for ongoing medical management as needed .  Craige Cotta, MD 01/03/2017, 10:31 AM

## 2017-01-03 NOTE — Progress Notes (Signed)
Patient ID: Raymond [name], male   DOB: 26-May-1985, 31 y.o.   MRN: [id number]  DAR: Pt. Denies SI/HI and A/V Hallucinations. He reports his sleep last night was good, appetite is good, energy level is high, and his concentration is good. He rates his depression level is 2/10, hopelessness level 0/10, and anxiety level 0/10. Patient does report some heartburn this morning and received PRN Maalox. Support and encouragement provided to the patient. Scheduled medications administered to patient per physician's orders. Patient is receptive and cooperative. He is seen in the milieu and is interacting with his peers appropriately. He reports that he is looking forward to discharge. Q15 minute checks are maintained for safety.

## 2017-01-03 NOTE — Progress Notes (Signed)
  [facility] Adult Case Management Discharge Plan :  Will you be returning to the same living situation after discharge:  Yes,  Pt returning home At discharge, do you have transportation home?: Yes,  Pt provided with bus pass if family cannot come to pick him up Do you have the ability to pay for your medications: Yes,  Pt provided with samples and prescriptions  Release of information consent forms completed and in the chart;  Patient's signature needed at discharge.  Patient to Follow up at: Follow-up Information    Inc, [facility] Follow up on 01/05/2017.   Why:  at 9:00am for your hospital discharge appointment.  Contact information: [address] [place] [address] [phone]        Medicine, [facility] Follow up on 01/07/2017.   Specialty:  Behavioral Health Why:  at 3:00pm for your initial assessment for therapy with Bren. Please arrive 30 minutes early to complete paperwork. Contact information: [address] [place] [address] [phone]           Next level of care provider has access to [facility] Link:no  Safety Planning and Suicide Prevention discussed: Yes,  with wife; see SPE note  Have you used any form of tobacco in the last 30 days? (Cigarettes, Smokeless Tobacco, Cigars, and/or Pipes): Yes  Has patient been referred to the Quitline?: Patient refused referral  Patient has been referred for addiction treatment: Yes  Raymond Lennert, LCSW 01/03/2017, 9:24 AM

## 2017-01-03 NOTE — Progress Notes (Signed)
Patient ID: Raymond [name], male   DOB: Sep 15, 1985, 31 y.o.   MRN: [id number]  Discharge Note- Belongings retrieved from bedside by patient at time of discharge. Discharge instructions and medications were reviewed with patient. Patient verbalized understanding of both medications and discharge instructions. [place] bus pass and [place] bus pass. Q15 minute safety checks maintained until time of discharge. No distress upon discharge.

## 2017-01-03 NOTE — Tx Team (Signed)
Interdisciplinary Treatment and Diagnostic Plan Update 01/03/2017 Time of Session: 9:30am  Raymond [name]  MRN: [id number]  Principal Diagnosis: MDD (major depressive disorder), recurrent severe, without psychosis (HCC)  Secondary Diagnoses: Principal Problem:   MDD (major depressive disorder), recurrent severe, without psychosis (HCC)   Current Medications:  Current Facility-Administered Medications  Medication Dose Route Frequency Provider Last Rate Last Dose  . acetaminophen (TYLENOL) tablet 650 mg  650 mg Oral Q6H PRN Fransisca Kaufmann A, NP   650 mg at 12/29/16 0150  . alum & mag hydroxide-simeth (MAALOX/MYLANTA) 200-200-20 MG/5ML suspension 30 mL  30 mL Oral Q4H PRN Fransisca Kaufmann A, NP   30 mL at 01/03/17 0807  . ARIPiprazole (ABILIFY) tablet 5 mg  5 mg Oral Daily Money, Gerlene Burdock, FNP   5 mg at 01/03/17 0805  . FLUoxetine (PROZAC) capsule 20 mg  20 mg Oral Daily Cobos, Rockey Situ, MD   20 mg at 01/03/17 0805  . insulin aspart (novoLOG) injection 0-15 Units  0-15 Units Subcutaneous TID WC Kerry Hough, PA-C   15 Units at 01/03/17 0645  . insulin aspart (novoLOG) injection 0-5 Units  0-5 Units Subcutaneous QHS Thermon Leyland, NP   3 Units at 01/01/17 2132  . insulin aspart (novoLOG) injection 4 Units  4 Units Subcutaneous TID WC Donell Sievert E, PA-C   4 Units at 01/03/17 0645  . insulin glargine (LANTUS) injection 25 Units  25 Units Subcutaneous QHS Oneta Rack, NP   25 Units at 01/02/17 2147  . lisinopril (PRINIVIL,ZESTRIL) tablet 5 mg  5 mg Oral Daily Money, Gerlene Burdock, FNP   5 mg at 01/03/17 0805  . magnesium hydroxide (MILK OF MAGNESIA) suspension 30 mL  30 mL Oral Daily PRN Fransisca Kaufmann A, NP      . metFORMIN (GLUCOPHAGE) tablet 500 mg  500 mg Oral BID WC Green, Terri L, RPH   500 mg at 01/03/17 0805  . mirtazapine (REMERON) tablet 7.5 mg  7.5 mg Oral QHS Money, Travis B, FNP   7.5 mg at 12/31/16 2203  . multivitamin with minerals tablet 1 tablet  1 tablet Oral Daily Cobos,  Rockey Situ, MD   1 tablet at 01/03/17 0805  . thiamine (VITAMIN B-1) tablet 100 mg  100 mg Oral Daily Cobos, Rockey Situ, MD   100 mg at 01/03/17 0805  . traZODone (DESYREL) tablet 50 mg  50 mg Oral QHS PRN Thermon Leyland, NP   50 mg at 01/02/17 2143    PTA Medications: Prescriptions Prior to Admission  Medication Sig Dispense Refill Last Dose  . insulin NPH-regular Human (NOVOLIN 70/30) (70-30) 100 UNIT/ML injection Inject 25 Units into the skin 2 (two) times daily with a meal. (Patient taking differently: Inject into the skin See admin instructions. Uses a sliding scale.) 10 mL 11 12/25/2016  . neomycin-bacitracin-polymyxin (NEOSPORIN) ointment Apply 1 application topically as needed for wound care. apply to eye   12/24/2016    Treatment Modalities: Medication Management, Group therapy, Case management,  1 to 1 session with clinician, Psychoeducation, Recreational therapy.  Patient Stressors: Marital or family conflict Medication change or noncompliance Substance abuse Patient Strengths: Ability for insight Average or above average intelligence Communication skills General fund of knowledge Motivation for treatment/growth  Physician Treatment Plan for Primary Diagnosis: MDD (major depressive disorder), recurrent severe, without psychosis (HCC) Long Term Goal(s): Improvement in symptoms so as ready for discharge Short Term Goals: Ability to verbalize feelings will improve Ability to disclose and discuss  suicidal ideas Ability to maintain clinical measurements within normal limits will improve Compliance with prescribed medications will improve  Medication Management: Evaluate patient's response, side effects, and tolerance of medication regimen.  Therapeutic Interventions: 1 to 1 sessions, Unit Group sessions and Medication administration.  Evaluation of Outcomes: Adequate for Discharge  Physician Treatment Plan for Secondary Diagnosis: Principal Problem:   MDD (major depressive  disorder), recurrent severe, without psychosis (HCC)  Long Term Goal(s): Improvement in symptoms so as ready for discharge  Short Term Goals: Ability to verbalize feelings will improve Ability to disclose and discuss suicidal ideas Ability to maintain clinical measurements within normal limits will improve Compliance with prescribed medications will improve  Medication Management: Evaluate patient's response, side effects, and tolerance of medication regimen.  Therapeutic Interventions: 1 to 1 sessions, Unit Group sessions and Medication administration.  Evaluation of Outcomes: Adequate for Discharge  RN Treatment Plan for Primary Diagnosis: MDD (major depressive disorder), recurrent severe, without psychosis (HCC) Long Term Goal(s): Knowledge of disease and therapeutic regimen to maintain health will improve  Short Term Goals: Ability to verbalize feelings will improve and Compliance with prescribed medications will improve  Medication Management: RN will administer medications as ordered by provider, will assess and evaluate patient's response and provide education to patient for prescribed medication. RN will report any adverse and/or side effects to prescribing provider.  Therapeutic Interventions: 1 on 1 counseling sessions, Psychoeducation, Medication administration, Evaluate responses to treatment, Monitor vital signs and CBGs as ordered, Perform/monitor CIWA, COWS, AIMS and Fall Risk screenings as ordered, Perform wound care treatments as ordered.  Evaluation of Outcomes: Adequate for Discharge  LCSW Treatment Plan for Primary Diagnosis: MDD (major depressive disorder), recurrent severe, without psychosis (HCC) Long Term Goal(s): Safe transition to appropriate next level of care at discharge, Engage patient in therapeutic group addressing interpersonal concerns. Short Term Goals: Engage patient in aftercare planning with referrals and resources, Increase emotional regulation,  Identify triggers associated with mental health/substance abuse issues and Increase skills for wellness and recovery  Therapeutic Interventions: Assess for all discharge needs, 1 to 1 time with Social worker, Explore available resources and support systems, Assess for adequacy in community support network, Educate family and significant other(s) on suicide prevention, Complete Psychosocial Assessment, Interpersonal group therapy.  Evaluation of Outcomes: Adequate for Discharge  Progress in Treatment: Attending groups: Yes Participating in groups: Yes Taking medication as prescribed: Yes, MD continues to assess for medication changes as needed Toleration medication: Yes, no side effects reported at this time Family/Significant other contact made: Yes with wife Patient understands diagnosis: Yes AEB willingness to continue in treatment Discussing patient identified problems/goals with staff: Yes Medical problems stabilized or resolved: Yes Denies suicidal/homicidal ideation: Yes Issues/concerns per patient self-inventory: None Other: N/A  New problem(s) identified: None identified at this time.   New Short Term/Long Term Goal(s): None identified at this time.   Discharge Plan or Barriers: Pt will return home and follow up outpatient with [facility]  Reason for Continuation of Hospitalization: None identified at this time.   Estimated Length of Stay: 0 days; Pt stable for DC  Attendees: Patient: 01/03/2017 9:26 AM  Physician: Dr. Jama Flavors 01/03/2017 9:26 AM  Nursing: Rodell Perna, RN 01/03/2017 9:26 AM  RN Care Manager: Onnie Boer, RN 01/03/2017 9:26 AM  Social Worker: Verdene Lennert  01/03/2017 9:26 AM  Recreational Therapist:  01/03/2017 9:26 AM  Other: Reola Calkins, Darius Bump; NP 01/03/2017 9:26 AM  Other:  01/03/2017 9:26  AM  Other: 01/03/2017 9:26 AM    Scribe for Treatment Team: Vernie Shanks, LCSW Clinical Social  Work [phone]

## 2017-01-03 NOTE — Discharge Summary (Signed)
Physician Discharge Summary Note  Patient:  Raymond [name] is an 31 y.o., male MRN:  [id number] DOB:  Aug 04, 1985 Patient phone:  [phone] (home)  Patient address:   [address] [place] [address],  Total Time spent with patient: 20 minutes  Date of Admission:  12/27/2016 Date of Discharge: 01/03/17   Reason for Admission:  Worsening depression with SI and reported OD attempt  Principal Problem: MDD (major depressive disorder), recurrent severe, without psychosis [name]) Discharge Diagnoses: Patient Active Problem List   Diagnosis Date Noted  . MDD (major depressive disorder), recurrent severe, without psychosis (HCC) [F33.2] 12/27/2016  . 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 Severe recurrent major depression without psychotic features (HCC) [F33.2] 07/22/2014  . Suicide attempt (HCC) [T14.91XA]     Past Psychiatric History: MDD, numerous suicide attempts, At least 8 BH admissions  Past Medical History:  Past Medical History:  Diagnosis Date  . Diabetes mellitus without complication (HCC)    Type 1  . Gallstones     Past Surgical History:  Procedure Laterality Date  . CHOLECYSTECTOMY     Family History: History reviewed. No pertinent family history. Family Psychiatric  History: Mom- Bipolar Social History:  History  Alcohol Use  . 0.6 - 1.2 oz/week  . 1 - 2 Cans of beer per week    Comment: few times a week     History  Drug Use  . Types: Other-see comments    Comment: OTC cough medicine    Social History   Social History  . Marital status: Single    Spouse name: N/A  . Number of children: N/A  . Years of education: N/A   Social History Main Topics  . Smoking status: Never Smoker  . Smokeless tobacco: Current User    Types: Chew  . Alcohol use 0.6 - 1.2 oz/week    1 - 2 Cans of beer per week     Comment: few times a week  . Drug use: Yes    Types: Other-see comments     Comment: OTC cough medicine  . Sexual activity: Yes     Birth control/ protection: None   Other Topics Concern  . None   Social History Narrative  . None    Hospital Course:   [name] Assessment Note: 31 y.o.malepresenting from [name] after he took 64 tablet of chlorpheniramine and dextromethorphan and drank half a bottle of wine in an SI attempt. The patient reports a long history of depression but states recent stressors involved a separation from his wife. Also reports, he had people in his house that were using intravenous drugs and his 20 year old daughter stepped on a syringe. Pt says, "I'm a failure as a father." Patient reports he has attempted SI several times in the past by OD, ingesting bleach, cuttinghis wrist, and not taking insulin. Denies Self injurious behavior. Patient reports symptoms of depression including crying episodes, social withdrawal, decreased concentration, fatigue, decreased motivation, irritability and feeling hopeless. Denies HI. Denies A/V. History of aggression in the past. Drinks 6-7 cans of beer a day. Past history of multiple drug use, including amphetamines, methamphetamines, cocaine and cannabis.   Admit Assessment Note: 31 year old male pt admitted on voluntary basis from [name]. He does endorse that he attempted suicide by overdose and reports that he has been constantly thinking about "Should I do it, Shouldn't I do it" and reports still having these thoughts on admission but is able to  contract for safety while in the hospital. He reports that he lives alone in an apartment in [name] and will return there after discharge. He does report that he has had substance abuse issues and reports that currently his issues are with over the counter cough medicine. He reports that he had gone to [name] in the past but reports that he has not been back in awhile. He reports that the only medication he takes is 70/30 insulin that he gets over the counter from [place] and reports that he doses himself  when he feels he needs to. He reports he wants to work on coping mechanisms while here as well as getting outpatient resources for after discharge.  Patient is seen today and reports that the trigger for this suicide attempt was that his young daughter was at his apartment and stepped on a used broken syringe needle that one of the patient's friend left in the house. The daughter was taken to the ER by the mother and the patient reports that the daughter is okay. However, that was a very depressing event for the patient. The patient reports that he was going to [name] in [name] but stopped going there and stopped his previous medications due to financial issues. He reports being on numerous medications and only recalls some of them, but reports that Abilify and Wellbutrin worked the best in the past and that he would prefer NOT to have Seroquel because he has a history of abusing it. He denies any current SI/HI/AVH, but admits to recent suicide attempt and he abuses OTC cough syrup regularly. He also requests Lisinopril to be restarted for HTN and that it helped with his kidneys as well because of his DM II.  Patient is seen today and has remained on the [name] unit for 6 days to stabilize. He has stabilized on medications and group therapy. He has been started and discharged on Abilify 5 mg PO Daily, Prozac 20 mg PO Daily, Mirtazapine 7.5 mg PO QHS and Trazodone 50 mg PO QHS PRN. He was also restarted on his Metformin, Insulin, and Lisinopril for his DMII. He had some elevated blood sugars, but was continued to be monitored and managed throughout stay. Patient shown improvement with improved sleep, appetite, mood, affect, and interaction. He denies any withdrawal symptoms. He attended group and participated appropriately. Patient has plans to follow up at [name] for psychiatry and [name] for DMII. He is provided prescriptions and 7 days of samples for his medications upon discharge. Patient denies SI/HI/AVH  and depression and anxiety. He contracts for safety.  Physical Findings: AIMS: Facial and Oral Movements Muscles of Facial Expression: None, normal Lips and Perioral Area: None, normal Jaw: None, normal Tongue: None, normal,Extremity Movements Upper (arms, wrists, hands, fingers): None, normal Lower (legs, knees, ankles, toes): None, normal, Trunk Movements Neck, shoulders, hips: None, normal, Overall Severity Severity of abnormal movements (highest score from questions above): None, normal Incapacitation due to abnormal movements: None, normal Patient's awareness of abnormal movements (rate only patient's report): No Awareness, Dental Status Current problems with teeth and/or dentures?: No Does patient usually wear dentures?: No  CIWA:  CIWA-Ar Total: 0 COWS:     Musculoskeletal: Strength & Muscle Tone: within normal limits Gait & Station: normal Patient leans: N/A  Psychiatric Specialty Exam: Physical Exam  Nursing note and vitals reviewed. Constitutional: He is oriented to person, place, and time. He appears well-developed and well-nourished.  Cardiovascular: Normal rate.   Respiratory: Effort normal.  Musculoskeletal: Normal range  of motion.  Neurological: He is alert and oriented to person, place, and time.  Skin: Skin is warm.    Review of Systems  Constitutional: Negative.   Eyes: Negative.   Respiratory: Negative.   Cardiovascular: Negative.   Gastrointestinal: Negative.   Genitourinary: Negative.   Musculoskeletal: Negative.   Skin: Negative.   Neurological: Negative.   Endo/Heme/Allergies: Negative.   Psychiatric/Behavioral: Positive for depression (at a 2/10). Negative for hallucinations and suicidal ideas. The patient is not nervous/anxious.     Blood pressure 125/80, pulse 85, temperature 98.7 F (37.1 C), temperature source Oral, resp. rate 20, height  (1.753 m), weight 85.3 kg (188 lb).Body mass index is 27.76 kg/m.  General Appearance: Casual   Eye Contact:  Good  Speech:  Clear and Coherent and Normal Rate  Volume:  Normal  Mood:  Euthymic  Affect:  Appropriate  Thought Process:  Coherent and Descriptions of Associations: Intact  Orientation:  Full (Time, Place, and Person)  Thought Content:  WDL  Suicidal Thoughts:  No  Homicidal Thoughts:  No  Memory:  Immediate;   Good Recent;   Good Remote;   Good  Judgement:  Good  Insight:  Good  Psychomotor Activity:  Normal  Concentration:  Concentration: Good and Attention Span: Good  Recall:  Good  Fund of Knowledge:  Good  Language:  Good  Akathisia:  No  Handed:  Right  AIMS (if indicated):     Assets:  Communication Skills Desire for Improvement Financial Resources/Insurance Housing Physical Health Social Support Transportation  ADL's:  Intact  Cognition:  WNL  Sleep:  Number of Hours: 6     Have you used any form of tobacco in the last 30 days? (Cigarettes, Smokeless Tobacco, Cigars, and/or Pipes): Yes  Has this patient used any form of tobacco in the last 30 days? (Cigarettes, Smokeless Tobacco, Cigars, and/or Pipes) Yes, Yes, A prescription for an FDA-approved tobacco cessation medication was offered at discharge and the patient refused  Blood Alcohol level:  Lab Results  Component Value Date   ETH <5 07/21/2014    Metabolic Disorder Labs:  Lab Results  Component Value Date   HGBA1C 11.3 (H) 12/30/2016   MPG 277.61 12/30/2016   No results found for: PROLACTIN No results found for: CHOL, TRIG, HDL, CHOLHDL, VLDL, LDLCALC  See Psychiatric Specialty Exam and Suicide Risk Assessment completed by Attending Physician prior to discharge.  Discharge destination:  Home  Is patient on multiple antipsychotic therapies at discharge:  No   Has Patient had three or more failed trials of antipsychotic monotherapy by history:  No  Recommended Plan for Multiple Antipsychotic Therapies: NA   Allergies as of 01/03/2017      Reactions   Tramadol Other (See  Comments)   Hypoglycemia Shock   Ibuprofen Swelling      Medication List    STOP taking these medications   insulin NPH-regular Human (70-30) 100 UNIT/ML injection Commonly known as:  NOVOLIN 70/30   neomycin-bacitracin-polymyxin ointment Commonly known as:  NEOSPORIN     TAKE these medications     Indication  ARIPiprazole 5 MG tablet Commonly known as:  ABILIFY Take 1 tablet (5 mg total) by mouth daily. For mood control  Indication:  mood stability   FLUoxetine 20 MG capsule Commonly known as:  PROZAC Take 1 capsule (20 mg total) by mouth daily. For control  Indication:  mood stability   insulin aspart 100 UNIT/ML injection Commonly known as:  novoLOG Inject 4  Units into the skin 3 (three) times daily with meals. For Diabetes  Indication:  Type 2 Diabetes   insulin glargine 100 UNIT/ML injection Commonly known as:  LANTUS Inject 0.25 mLs (25 Units total) into the skin at bedtime. For Diabetes  Indication:  Type 2 Diabetes   lisinopril 5 MG tablet Commonly known as:  PRINIVIL,ZESTRIL Take 1 tablet (5 mg total) by mouth daily. For high blood pressure  Indication:  High Blood Pressure Disorder   metFORMIN 500 MG tablet Commonly known as:  GLUCOPHAGE Take 1 tablet (500 mg total) by mouth 2 (two) times daily with a meal. For diabetes  Indication:  Type 2 Diabetes   mirtazapine 7.5 MG tablet Commonly known as:  REMERON Take 1 tablet (7.5 mg total) by mouth at bedtime. For mood control  Indication:  mood stability   traZODone 50 MG tablet Commonly known as:  DESYREL Take 1 tablet (50 mg total) by mouth at bedtime as needed for sleep.  Indication:  Trouble Sleeping      Follow-up [facility] Follow up on 01/05/2017.   Why:  at 9:00am for your hospital discharge appointment.  Contact information: [address] [place] [address] [phone]        [facility] Follow up on 01/07/2017.   Specialty:   Behavioral Health Why:  at 3:00pm for your initial assessment for therapy with Bren. Please arrive 30 minutes early to complete paperwork. Contact information: [address] [place] [address] [phone]        Hortencia Pilar, RN .        [facility] Follow up on 01/04/2017.   Why:  PLEASE RENEW STAR CARD OR SHOW PROOF OF [employer] INSURANCE TO GET HOSPITAL FOLLOW UP APPT FOR DIABETES MANAGEMENT Contact information: [address] [place], [place]   [phone]          Follow-up recommendations:  Continue activity as tolerated. Continue diet as recommended by your PCP. Ensure to keep all appointments with outpatient providers.  Comments:  Patient is instructed prior to discharge to: Take all medications as prescribed by his/her mental healthcare provider. Report any adverse effects and or reactions from the medicines to his/her outpatient provider promptly. Patient has been instructed & cautioned: To not engage in alcohol and or illegal drug use while on prescription medicines. In the event of worsening symptoms, patient is instructed to call the crisis hotline, 911 and or go to the nearest ED for appropriate evaluation and treatment of symptoms. To follow-up with his/her primary care provider for your other medical issues, concerns and or health care needs.    Signed: Gerlene Burdock Money, FNP 01/03/2017, 12:59 PM  Patient seen, Suicide Assessment Completed.  Disposition Plan Reviewed

## 2017-01-03 NOTE — Progress Notes (Signed)
Recreation Therapy Notes  Date: 01/03/17 Time: 0930 Location: [address] Dayroom  Group Topic: Stress Management  Goal Area(s) Addresses:  Patient will verbalize importance of using healthy stress management.  Patient will identify positive emotions associated with healthy stress management.   Behavioral Response: Engaged  Intervention: Stress Management  Activity :  Meditation.  LRT introduced the stress management technique of meditation.  LRT played a meditation from the Calm app that explained stress and how we take on and exhibit stress throughout the body.  Patients were to listen and follow along as the meditation played to engage in the technique.  Education:  Stress Management, Discharge Planning.   Education Outcome: Acknowledges edcuation/In group clarification offered/Needs additional education  Clinical Observations/Feedback: Pt attended group.    Caroll Rancher, LRT/CTRS         Caroll Rancher A 01/03/2017 12:23 PM

## 2018-10-04 ENCOUNTER — Encounter (HOSPITAL_COMMUNITY): Payer: Self-pay

## 2018-10-04 ENCOUNTER — Inpatient Hospital Stay (HOSPITAL_COMMUNITY): Payer: Self-pay

## 2018-10-04 ENCOUNTER — Other Ambulatory Visit: Payer: Self-pay

## 2018-10-04 ENCOUNTER — Inpatient Hospital Stay (HOSPITAL_COMMUNITY)
Admission: EM | Admit: 2018-10-04 | Discharge: 2018-10-07 | DRG: 638 | Disposition: A | Discharge status: Home / Self Care | Payer: Self-pay | Attending: Internal Medicine | Admitting: Internal Medicine

## 2018-10-04 DIAGNOSIS — Z794 Long term (current) use of insulin: Secondary | ICD-10-CM

## 2018-10-04 DIAGNOSIS — F603 Borderline personality disorder: Secondary | ICD-10-CM | POA: Diagnosis present

## 2018-10-04 DIAGNOSIS — Z886 Allergy status to analgesic agent status: Secondary | ICD-10-CM

## 2018-10-04 DIAGNOSIS — Z9114 Patient's other noncompliance with medication regimen: Secondary | ICD-10-CM

## 2018-10-04 DIAGNOSIS — F419 Anxiety disorder, unspecified: Secondary | ICD-10-CM | POA: Diagnosis present

## 2018-10-04 DIAGNOSIS — E1010 Type 1 diabetes mellitus with ketoacidosis without coma: Principal | ICD-10-CM | POA: Diagnosis present

## 2018-10-04 DIAGNOSIS — F329 Major depressive disorder, single episode, unspecified: Secondary | ICD-10-CM | POA: Diagnosis present

## 2018-10-04 DIAGNOSIS — N179 Acute kidney failure, unspecified: Secondary | ICD-10-CM | POA: Diagnosis present

## 2018-10-04 DIAGNOSIS — E872 Acidosis: Secondary | ICD-10-CM

## 2018-10-04 DIAGNOSIS — E1110 Type 2 diabetes mellitus with ketoacidosis without coma: Secondary | ICD-10-CM | POA: Diagnosis present

## 2018-10-04 DIAGNOSIS — T383X6A Underdosing of insulin and oral hypoglycemic [antidiabetic] drugs, initial encounter: Secondary | ICD-10-CM | POA: Diagnosis present

## 2018-10-04 DIAGNOSIS — Z915 Personal history of self-harm: Secondary | ICD-10-CM

## 2018-10-04 DIAGNOSIS — Z1159 Encounter for screening for other viral diseases: Secondary | ICD-10-CM

## 2018-10-04 DIAGNOSIS — E8729 Other acidosis: Secondary | ICD-10-CM

## 2018-10-04 DIAGNOSIS — E10649 Type 1 diabetes mellitus with hypoglycemia without coma: Secondary | ICD-10-CM | POA: Diagnosis not present

## 2018-10-04 DIAGNOSIS — R0600 Dyspnea, unspecified: Secondary | ICD-10-CM

## 2018-10-04 DIAGNOSIS — E860 Dehydration: Secondary | ICD-10-CM | POA: Diagnosis present

## 2018-10-04 DIAGNOSIS — Z885 Allergy status to narcotic agent status: Secondary | ICD-10-CM

## 2018-10-04 DIAGNOSIS — I10 Essential (primary) hypertension: Secondary | ICD-10-CM | POA: Diagnosis present

## 2018-10-04 DIAGNOSIS — R410 Disorientation, unspecified: Secondary | ICD-10-CM

## 2018-10-04 HISTORY — DX: Borderline personality disorder: F60.3

## 2018-10-04 LAB — BASIC METABOLIC PANEL
Anion gap: 19 — ABNORMAL HIGH (ref 5–15)
BUN: 47 mg/dL — ABNORMAL HIGH (ref 6–20)
CO2: 11 mmol/L — ABNORMAL LOW (ref 22–32)
Calcium: 8.3 mg/dL — ABNORMAL LOW (ref 8.9–10.3)
Chloride: 106 mmol/L (ref 98–111)
Creatinine, Ser: 2.3 mg/dL — ABNORMAL HIGH (ref 0.61–1.24)
GFR calc Af Amer: 42 mL/min — ABNORMAL LOW (ref >60)
GFR calc non Af Amer: 36 mL/min — ABNORMAL LOW (ref >60)
Glucose, Bld: 210 mg/dL — ABNORMAL HIGH (ref 70–99)
Potassium: 4.5 mmol/L (ref 3.5–5.1)
Sodium: 136 mmol/L (ref 135–145)

## 2018-10-04 LAB — COMPREHENSIVE METABOLIC PANEL
ALT: 44 U/L (ref 0–44)
AST: 24 U/L (ref 15–41)
Albumin: 3.4 g/dL — ABNORMAL LOW (ref 3.5–5.0)
Alkaline Phosphatase: 197 U/L — ABNORMAL HIGH (ref 38–126)
BUN: 47 mg/dL — ABNORMAL HIGH (ref 6–20)
CO2: <7 mmol/L — ABNORMAL LOW (ref 22–32)
Calcium: 9.1 mg/dL (ref 8.9–10.3)
Chloride: 96 mmol/L — ABNORMAL LOW (ref 98–111)
Creatinine, Ser: 3.06 mg/dL — ABNORMAL HIGH (ref 0.61–1.24)
GFR calc Af Amer: 30 mL/min — ABNORMAL LOW (ref >60)
GFR calc non Af Amer: 25 mL/min — ABNORMAL LOW (ref >60)
Glucose, Bld: 473 mg/dL — ABNORMAL HIGH (ref 70–99)
Potassium: 4.9 mmol/L (ref 3.5–5.1)
Sodium: 133 mmol/L — ABNORMAL LOW (ref 135–145)
Total Bilirubin: 2.1 mg/dL — ABNORMAL HIGH (ref 0.3–1.2)
Total Protein: 6.9 g/dL (ref 6.5–8.1)

## 2018-10-04 LAB — CBC WITH DIFFERENTIAL/PLATELET
Abs Immature Granulocytes: 0.46 10*3/uL — ABNORMAL HIGH (ref 0.00–0.07)
Basophils Absolute: 0.1 10*3/uL (ref 0.0–0.1)
Basophils Relative: 0 %
Eosinophils Absolute: 0 10*3/uL (ref 0.0–0.5)
Eosinophils Relative: 0 %
HCT: 40.5 % (ref 39.0–52.0)
Hemoglobin: 13.8 g/dL (ref 13.0–17.0)
Immature Granulocytes: 2 %
Lymphocytes Relative: 9 %
Lymphs Abs: 2 10*3/uL (ref 0.7–4.0)
MCH: 30.6 pg (ref 26.0–34.0)
MCHC: 34.1 g/dL (ref 30.0–36.0)
MCV: 89.8 fL (ref 80.0–100.0)
Monocytes Absolute: 0.9 10*3/uL (ref 0.1–1.0)
Monocytes Relative: 4 %
Neutro Abs: 19.2 10*3/uL — ABNORMAL HIGH (ref 1.7–7.7)
Neutrophils Relative %: 85 %
Platelets: 452 10*3/uL — ABNORMAL HIGH (ref 150–400)
RBC: 4.51 MIL/uL (ref 4.22–5.81)
RDW: 11.9 % (ref 11.5–15.5)
WBC: 22.6 10*3/uL — ABNORMAL HIGH (ref 4.0–10.5)
nRBC: 0 % (ref 0.0–0.2)

## 2018-10-04 LAB — CBG MONITORING, ED
Glucose-Capillary: 501 mg/dL (ref 70–99)
Glucose-Capillary: 392 mg/dL — ABNORMAL HIGH (ref 70–99)
Glucose-Capillary: 340 mg/dL — ABNORMAL HIGH (ref 70–99)
Glucose-Capillary: 249 mg/dL — ABNORMAL HIGH (ref 70–99)
Glucose-Capillary: 268 mg/dL — ABNORMAL HIGH (ref 70–99)

## 2018-10-04 LAB — POCT I-STAT EG7
Acid-base deficit: 22 mmol/L — ABNORMAL HIGH (ref 0.0–2.0)
Bicarbonate: 6 mmol/L — ABNORMAL LOW (ref 20.0–28.0)
Calcium, Ion: 1.22 mmol/L (ref 1.15–1.40)
HCT: 41 % (ref 39.0–52.0)
Hemoglobin: 13.9 g/dL (ref 13.0–17.0)
O2 Saturation: 64 %
Potassium: 4.8 mmol/L (ref 3.5–5.1)
Sodium: 131 mmol/L — ABNORMAL LOW (ref 135–145)
TCO2: 7 mmol/L — ABNORMAL LOW (ref 22–32)
pCO2, Ven: 19.6 mmHg — CL (ref 44.0–60.0)
pH, Ven: 7.097 — CL (ref 7.250–7.430)
pO2, Ven: 44 mmHg (ref 32.0–45.0)

## 2018-10-04 LAB — GLUCOSE, CAPILLARY
Glucose-Capillary: 203 mg/dL — ABNORMAL HIGH (ref 70–99)
Glucose-Capillary: 145 mg/dL — ABNORMAL HIGH (ref 70–99)

## 2018-10-04 LAB — SARS CORONAVIRUS 2 BY RT PCR (HOSPITAL ORDER, PERFORMED IN ~~LOC~~ HOSPITAL LAB): SARS Coronavirus 2: NEGATIVE

## 2018-10-04 MED ORDER — ENOXAPARIN SODIUM 40 MG/0.4ML ~~LOC~~ SOLN
40.0000 mg | SUBCUTANEOUS | Status: DC
  Administered 2018-10-05 – 2018-10-07 (×2): 40 mg via SUBCUTANEOUS
  Filled 2018-10-04 (×3): qty 0.4

## 2018-10-04 MED ORDER — DEXTROSE-NACL 5-0.45 % IV SOLN
INTRAVENOUS | Status: DC
  Administered 2018-10-04: 21:00:00 via INTRAVENOUS

## 2018-10-04 MED ORDER — POTASSIUM CHLORIDE 10 MEQ/100ML IV SOLN
10.0000 meq | INTRAVENOUS | Status: AC
  Administered 2018-10-04 (×2): 10 meq via INTRAVENOUS
  Filled 2018-10-04 (×2): qty 100

## 2018-10-04 MED ORDER — SODIUM CHLORIDE 0.9 % IV BOLUS: 1000.0000 mL | Freq: Once | INTRAVENOUS | Status: DC

## 2018-10-04 MED ORDER — ONDANSETRON HCL 4 MG/2ML IJ SOLN
4.0000 mg | Freq: Once | INTRAMUSCULAR | Status: AC
  Administered 2018-10-04: 4 mg via INTRAVENOUS
  Filled 2018-10-04: qty 2

## 2018-10-04 MED ORDER — SODIUM CHLORIDE 0.9 % IV BOLUS
1000.0000 mL | Freq: Once | INTRAVENOUS | Status: AC
  Administered 2018-10-04: 1000 mL via INTRAVENOUS

## 2018-10-04 MED ORDER — INSULIN REGULAR(HUMAN) IN NACL 100-0.9 UT/100ML-% IV SOLN
INTRAVENOUS | Status: DC
  Administered 2018-10-04: 3.3 [IU]/h via INTRAVENOUS
  Filled 2018-10-04: qty 100

## 2018-10-04 MED ORDER — SODIUM CHLORIDE 0.9 % IV BOLUS
500.0000 mL | Freq: Once | INTRAVENOUS | Status: AC
  Administered 2018-10-04: 500 mL via INTRAVENOUS

## 2018-10-04 MED ORDER — INSULIN REGULAR(HUMAN) IN NACL 100-0.9 UT/100ML-% IV SOLN
INTRAVENOUS | Status: DC
  Administered 2018-10-04: 4.2 [IU]/h via INTRAVENOUS

## 2018-10-04 MED ORDER — DEXTROSE-NACL 5-0.45 % IV SOLN: INTRAVENOUS | Status: DC

## 2018-10-04 MED ORDER — BUPROPION HCL ER (XL) 300 MG PO TB24
300.0000 mg | ORAL_TABLET | Freq: Every day | ORAL | Status: DC
  Administered 2018-10-05 – 2018-10-07 (×3): 300 mg via ORAL
  Filled 2018-10-04 (×3): qty 1

## 2018-10-04 MED ORDER — SODIUM CHLORIDE 0.9 % IV SOLN
INTRAVENOUS | Status: AC
  Administered 2018-10-04: 22:00:00 via INTRAVENOUS

## 2018-10-04 MED ORDER — SODIUM CHLORIDE 0.9 % IV SOLN: INTRAVENOUS | Status: DC

## 2018-10-04 MED ORDER — KCL IN DEXTROSE-NACL 20-5-0.45 MEQ/L-%-% IV SOLN
INTRAVENOUS | Status: DC
  Administered 2018-10-04: 22:00:00 via INTRAVENOUS
  Filled 2018-10-04: qty 1000

## 2018-10-04 NOTE — ED Provider Notes (Addendum)
[facility] Provider Note   CSN: [id number] Arrival date & time: 10/04/18  1628    History   Chief Complaint Chief Complaint  Patient presents with  . Hyperglycemia    HPI Raymond MontaneVictor A Bagent is a 33 y.o. male.     HPI   [age]yo male with history of type I DM presents with concern for hyperglycemia. He reports he has been out of his insulin for a few days and has been vomiting over the last few days. Today in an effort to help his hyperglycemia he reports took 150U humalog 70/30 every 30-60 minutes for the last 6 hours.  He denies any SI.  Reports he has had no other symptoms, no fevers, no cough, no diarrhea.  Past Medical History:  Diagnosis Date  . Diabetes mellitus without complication (HCC)    Type 1  . Gallstones     Patient Active Problem List   Diagnosis Date Noted  . DKA (diabetic ketoacidoses) (HCC) 10/04/2018  . 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 Episodic substance abuse 07/23/2014  . Severe recurrent major depression without psychotic features (HCC) 07/22/2014  . Suicide attempt [facility](HCC)     Past Surgical History:  Procedure Laterality Date  . CHOLECYSTECTOMY          Home Medications    Prior to Admission medications   Medication Sig Start Date End Date Taking? Authorizing Provider  buPROPion (WELLBUTRIN XL) 150 MG 24 hr tablet Take 300 mg by mouth daily. 09/12/18  Yes [provider]  NOVOLIN 70/30 RELION (70-30) 100 UNIT/ML injection Inject 35 Units into the skin 2 (two) times a day. 07/06/18  Yes [provider]  ARIPiprazole (ABILIFY) 5 MG tablet Take 1 tablet (5 mg total) by mouth daily. For mood control Patient not taking: Reported on 10/04/2018 01/04/17   Money, Gerlene Burdockravis B, FNP  FLUoxetine (PROZAC) 20 MG capsule Take 1 capsule (20 mg total) by mouth daily. For control 01/04/17   Money, Feliz Beamravis B, FNP  insulin aspart (NOVOLOG) 100 UNIT/ML injection  Inject 4 Units into the skin 3 (three) times daily with meals. For Diabetes 01/03/17   Money, Gerlene Burdockravis B, FNP  insulin glargine (LANTUS) 100 UNIT/ML injection Inject 0.25 mLs (25 Units total) into the skin at bedtime. For Diabetes 01/03/17   Money, Gerlene Burdockravis B, FNP  lisinopril (PRINIVIL,ZESTRIL) 5 MG tablet Take 1 tablet (5 mg total) by mouth daily. For high blood pressure Patient not taking: Reported on 10/04/2018 01/04/17   Money, Gerlene Burdockravis B, FNP  lisinopril (ZESTRIL) 10 MG tablet Take 10 mg by mouth daily. 09/12/18   [provider]  metFORMIN (GLUCOPHAGE) 500 MG tablet Take 1 tablet (500 mg total) by mouth 2 (two) times daily with a meal. For diabetes 01/03/17   Money, Gerlene Burdockravis B, FNP  mirtazapine (REMERON) 7.5 MG tablet Take 1 tablet (7.5 mg total) by mouth at bedtime. For mood control 01/03/17   Money, Gerlene Burdockravis B, FNP  traZODone (DESYREL) 50 MG tablet Take 1 tablet (50 mg total) by mouth at bedtime as needed for sleep. 01/03/17   Money, Gerlene Burdockravis B, FNP    Family History History reviewed. No pertinent family history.  Social History Social History   Tobacco Use  . Smoking status: Never Smoker  . Smokeless tobacco: Current User    Types: Chew  Substance Use Topics  . Alcohol use: Yes    Alcohol/week: 1.0 - 2.0 standard drinks    Types: 1 -  2 Cans of beer per week    Comment: few times a week  . Drug use: Yes    Types: Other-see comments    Comment: OTC cough medicine     Allergies   Ibuprofen and Tramadol   Review of Systems Review of Systems  Constitutional: Positive for fatigue. Negative for fever.  HENT: Negative for sore throat.   Eyes: Negative for visual disturbance.  Respiratory: Negative for cough and shortness of breath.   Cardiovascular: Negative for chest pain.  Gastrointestinal: Positive for nausea and vomiting. Negative for abdominal pain.  Genitourinary: Negative for difficulty urinating.  Musculoskeletal: Negative for back pain and neck stiffness.  Skin: Negative  for rash.  Neurological: Negative for syncope and headaches.     Physical Exam Updated Vital Signs BP 124/75 (BP Location: Left Arm)   Pulse (!) 101   Temp 98.1 F (36.7 C) (Oral)   Resp 17   Ht 5\' 10"  (1.778 m)   Wt 78 kg   SpO2 100%   BMI 24.67 kg/m   Physical Exam Vitals signs and nursing note reviewed.  Constitutional:      General: He is not in acute distress.    Appearance: He is well-developed. He is not diaphoretic.  HENT:     Head: Normocephalic and atraumatic.  Eyes:     Conjunctiva/sclera: Conjunctivae normal.  Neck:     Musculoskeletal: Normal range of motion.  Cardiovascular:     Rate and Rhythm: Regular rhythm. Tachycardia present.     Heart sounds: Normal heart sounds. No murmur. No friction rub. No gallop.   Pulmonary:     Effort: Pulmonary effort is normal. Tachypnea present. No respiratory distress.     Breath sounds: Normal breath sounds. No wheezing or rales.  Abdominal:     General: There is no distension.     Palpations: Abdomen is soft.     Tenderness: There is no abdominal tenderness. There is no guarding.  Skin:    General: Skin is warm and dry.  Neurological:     Mental Status: He is alert and oriented to person, place, and time.      ED Treatments / Results  Labs (all labs ordered are listed, but only abnormal results are displayed) Labs Reviewed  CBC WITH DIFFERENTIAL/PLATELET - Abnormal; Notable for the following components:      Result Value   WBC 22.6 (*)    Platelets 452 (*)    Neutro Abs 19.2 (*)    Abs Immature Granulocytes 0.46 (*)    All other components within normal limits  COMPREHENSIVE METABOLIC PANEL - Abnormal; Notable for the following components:   Sodium 133 (*)    Chloride 96 (*)    CO2 <7 (*)    Glucose, Bld 473 (*)    BUN 47 (*)    Creatinine, Ser 3.06 (*)    Albumin 3.4 (*)    Alkaline Phosphatase 197 (*)    Total Bilirubin 2.1 (*)    GFR calc non Af Amer 25 (*)    GFR calc Af Amer 30 (*)    All  other components within normal limits  BASIC METABOLIC PANEL - Abnormal; Notable for the following components:   CO2 11 (*)    Glucose, Bld 210 (*)    BUN 47 (*)    Creatinine, Ser 2.30 (*)    Calcium 8.3 (*)    GFR calc non Af Amer 36 (*)    GFR calc Af Amer 42 (*)  Anion gap 19 (*)    All other components within normal limits  GLUCOSE, CAPILLARY - Abnormal; Notable for the following components:   Glucose-Capillary 203 (*)    All other components within normal limits  GLUCOSE, CAPILLARY - Abnormal; Notable for the following components:   Glucose-Capillary 145 (*)    All other components within normal limits  CBG MONITORING, ED - Abnormal; Notable for the following components:   Glucose-Capillary 501 (*)    All other components within normal limits  POCT I-STAT EG7 - Abnormal; Notable for the following components:   pH, Ven 7.097 (*)    pCO2, Ven 19.6 (*)    Bicarbonate 6.0 (*)    TCO2 7 (*)    Acid-base deficit 22.0 (*)    Sodium 131 (*)    All other components within normal limits  CBG MONITORING, ED - Abnormal; Notable for the following components:   Glucose-Capillary 392 (*)    All other components within normal limits  CBG MONITORING, ED - Abnormal; Notable for the following components:   Glucose-Capillary 340 (*)    All other components within normal limits  CBG MONITORING, ED - Abnormal; Notable for the following components:   Glucose-Capillary 249 (*)    All other components within normal limits  CBG MONITORING, ED - Abnormal; Notable for the following components:   Glucose-Capillary 268 (*)    All other components within normal limits  SARS CORONAVIRUS 2 (HOSPITAL ORDER, [facility])  URINALYSIS, ROUTINE W REFLEX MICROSCOPIC  BASIC METABOLIC PANEL  BASIC METABOLIC PANEL  BASIC METABOLIC PANEL  HEMOGLOBIN A1C  HIV ANTIBODY (ROUTINE TESTING W REFLEX)  BASIC METABOLIC PANEL    EKG None Sinus tachycardia   Radiology Dg Chest  Port 1 View  Result Date: 10/04/2018 CLINICAL DATA:  Initial evaluation for acute dyspnea, confusion. EXAM: PORTABLE CHEST 1 VIEW COMPARISON:  Prior radiograph from 12/26/2016. FINDINGS: Exaggeration of the cardiac silhouette related to AP technique. Transverse heart size likely at the upper limits of normal. Mediastinal silhouette within normal limits. Lungs mildly hypoinflated. No focal infiltrates. No pulmonary edema or pleural effusion. No pneumothorax. No acute osseous finding. IMPRESSION: No radiographic evidence for active cardiopulmonary disease. Electronically Signed   By: Jeannine Boga M.D.   On: 10/04/2018 22:20    Procedures .Critical Care Performed by: Gareth Morgan, MD Authorized by: Gareth Morgan, MD   Critical care provider statement:    Critical care time (minutes):  30   Critical care was time spent personally by me on the following activities:  Evaluation of patient's response to treatment, examination of patient, ordering and performing treatments and interventions, ordering and review of laboratory studies, ordering and review of radiographic studies, pulse oximetry, re-evaluation of patient's condition, obtaining history from patient or surrogate and review of old charts   (including critical care time)  Medications Ordered in ED Medications  enoxaparin (LOVENOX) injection 40 mg (has no administration in time range)  buPROPion (WELLBUTRIN XL) 24 hr tablet 300 mg (has no administration in time range)  0.9 %  sodium chloride infusion (has no administration in time range)  insulin regular, human (MYXREDLIN) 100 units/ 100 mL infusion (4.2 Units/hr Intravenous New Bag/Given 10/04/18 2145)  0.9 %  sodium chloride infusion ( Intravenous New Bag/Given 10/04/18 2144)  dextrose 5 % and 0.45 % NaCl with KCl 20 mEq/L infusion ( Intravenous New Bag/Given 10/04/18 2151)  sodium chloride 0.9 % bolus 1,000 mL (0 mLs Intravenous Stopped 10/04/18 2045)  potassium  chloride 10 mEq in  100 mL IVPB (0 mEq Intravenous Stopped 10/04/18 2054)  ondansetron (ZOFRAN) injection 4 mg (4 mg Intravenous Given 10/04/18 1830)  sodium chloride 0.9 % bolus 500 mL (500 mLs Intravenous New Bag/Given 10/04/18 2041)  sodium chloride 0.9 % bolus 1,000 mL (1,000 mLs Intravenous New Bag/Given 10/04/18 2251)     Initial Impression / Assessment and Plan / ED Course  I have reviewed the triage vital signs and the nursing notes.  Pertinent labs & imaging results that were available during my care of the patient were reviewed by me and considered in my medical decision making (see chart for details).         33yo male with history of type I DM presents with concern for hyperglycemia in setting of running out of insulin and then today attempted to take a large amount of insulin to assist with his hyperglycemia.  Presents with symptoms and labs consistent with DKA.  Has severe lab alterations with bicarb less than 7, pH 7.097 with significant respiratory compensation. Also with severe AKI likely in the setting of dehydration.  Initiated on insulin drip, given K.  On my reevaluation he continues to appear appropriately tachypneic, is appropriately answering questions, and do not feel he requires intubation at this time.  Will admit to IM for continued close monitoring in the stepdown unit.   Final Clinical Impressions(s) / ED Diagnoses   Final diagnoses:  Diabetic ketoacidosis without coma associated with type 1 diabetes mellitus (HCC)  High anion gap metabolic acidosis  Acute kidney injury [facility](HCC)    ED Discharge Orders    None          Alvira MondaySchlossman, Leander Tout, MD 10/05/18 [phone]

## 2018-10-04 NOTE — ED Notes (Signed)
ED TO INPATIENT HANDOFF REPORT  ED Nurse Name and Phone #:   S Name/Age/Gender Raymond MontaneVictor A [name] 33 y.o. male Room/Bed: 042C/042C  Code Status   Code Status: Full Code  Home/SNF/Other Home Patient oriented to: self, place, time and situation Is this baseline? Yes   Triage Complete: Triage complete  Chief Complaint Hyperglycemia  Triage Note Pt bib ems from home with hyperglycemia. Pt endorsed emesis over the last few days. Has been out of his insulin for a couple days. Pt has taken extra humalog 70/30. Took 150units every 30-60 minutes for the last 6 hours. CBG 570. HR 116, rr 24, 100% RA, 98.2, 130/70. 500cc NS given en route.    Allergies Allergies  Allergen Reactions  . Ibuprofen Anaphylaxis and Swelling    Facial swelling  . Tramadol Other (See Comments)    Hypoglycemic Shock    Level of Care/Admitting Diagnosis ED Disposition    ED Disposition Condition Comment   Admit  [facility] [[id number]]  Level of Care: Progressive [102]  Covid Evaluation: Asymptomatic Screening Protocol (No Symptoms)  Diagnosis: DKA (diabetic ketoacidoses) [facility](HCC) [[id number]]) [[id number]]  Admitting Physician: Erenest BlankBUTCHER, ELIZABETH A [[id number]]  Attending Physician: Burns SpainBUTCHER, ELIZABETH A [phone][[id number]]  Estimated length of stay: past midnight tomorrow  Certification:: I certify this patient will need inpatient services for at least 2 midnights  PT Class (Do Not Modify): Inpatient [101]  PT Acc Code (Do Not Modify): Private [1]       B Medical/Surgery History Past Medical History:  Diagnosis Date  . Diabetes mellitus without complication (HCC)    Type 1  . Gallstones    Past Surgical History:  Procedure Laterality Date  . CHOLECYSTECTOMY       A IV Location/Drains/Wounds Patient Lines/Drains/Airways Status   Active Line/Drains/Airways    Name:   Placement date:   Placement time:   Site:   Days:   Peripheral IV 10/04/18 Right Antecubital   10/04/18    1630    Antecubital    less than 1   Peripheral IV 10/04/18 Left Antecubital   10/04/18    1820    Antecubital   less than 1          Intake/Output Last 24 hours  Intake/Output Summary (Last 24 hours) at 10/04/2018 2025 Last data filed at 10/04/2018 1952 Gross per 24 hour  Intake 100 ml  Output -  Net 100 ml    Labs/Imaging Results for orders placed or performed during the hospital encounter of 10/04/18 (from the past 48 hour(s))  CBG monitoring, ED     Status: Abnormal   Collection Time: 10/04/18  4:39 PM  Result Value Ref Range   Glucose-Capillary 501 (HH) 70 - 99 mg/dL   Comment 1 Notify RN    Comment 2 Document in Chart   CBC with Differential     Status: Abnormal   Collection Time: 10/04/18  4:42 PM  Result Value Ref Range   WBC 22.6 (H) 4.0 - 10.5 K/uL   RBC 4.51 4.22 - 5.81 MIL/uL   Hemoglobin 13.8 13.0 - 17.0 g/dL   HCT 47.840.5 29.539.0 - 62.152.0 %   MCV 89.8 80.0 - 100.0 fL   MCH 30.6 26.0 - 34.0 pg   MCHC 34.1 30.0 - 36.0 g/dL   RDW 30.811.9 65.711.5 - 84.615.5 %   Platelets 452 (H) 150 - 400 K/uL   nRBC 0.0 0.0 - 0.2 %   Neutrophils Relative % 85 %   Neutro Abs  19.2 (H) 1.7 - 7.7 K/uL   Lymphocytes Relative 9 %   Lymphs Abs 2.0 0.7 - 4.0 K/uL   Monocytes Relative 4 %   Monocytes Absolute 0.9 0.1 - 1.0 K/uL   Eosinophils Relative 0 %   Eosinophils Absolute 0.0 0.0 - 0.5 K/uL   Basophils Relative 0 %   Basophils Absolute 0.1 0.0 - 0.1 K/uL   Immature Granulocytes 2 %   Abs Immature Granulocytes 0.46 (H) 0.00 - 0.07 K/uL    Comment: Performed at [facility] [address] [place], [place] [address]  Comprehensive metabolic panel     Status: Abnormal   Collection Time: 10/04/18  4:42 PM  Result Value Ref Range   Sodium 133 (L) 135 - 145 mmol/L   Potassium 4.9 3.5 - 5.1 mmol/L   Chloride 96 (L) 98 - 111 mmol/L   CO2 <7 (L) 22 - 32 mmol/L   Glucose, Bld 473 (H) 70 - 99 mg/dL   BUN 47 (H) 6 - 20 mg/dL   Creatinine, Ser 3.06 (H) 0.61 - 1.24 mg/dL   Calcium 9.1 8.9 - 10.3 mg/dL   Total  Protein 6.9 6.5 - 8.1 g/dL   Albumin 3.4 (L) 3.5 - 5.0 g/dL   AST 24 15 - 41 U/L   ALT 44 0 - 44 U/L   Alkaline Phosphatase 197 (H) 38 - 126 U/L   Total Bilirubin 2.1 (H) 0.3 - 1.2 mg/dL   GFR calc non Af Amer 25 (L) >60 mL/min   GFR calc Af Amer 30 (L) >60 mL/min   Anion gap NOT CALCULATED 5 - 15    Comment: Performed at [facility], [place] [address] [place], [place] [address]  POCT I-Stat EG7     Status: Abnormal   Collection Time: 10/04/18  5:11 PM  Result Value Ref Range   pH, Ven 7.097 (LL) 7.250 - 7.430   pCO2, Ven 19.6 (LL) 44.0 - 60.0 mmHg   pO2, Ven 44.0 32.0 - 45.0 mmHg   Bicarbonate 6.0 (L) 20.0 - 28.0 mmol/L   TCO2 7 (L) 22 - 32 mmol/L   O2 Saturation 64.0 %   Acid-base deficit 22.0 (H) 0.0 - 2.0 mmol/L   Sodium 131 (L) 135 - 145 mmol/L   Potassium 4.8 3.5 - 5.1 mmol/L   Calcium, Ion 1.22 1.15 - 1.40 mmol/L   HCT 41.0 39.0 - 52.0 %   Hemoglobin 13.9 13.0 - 17.0 g/dL   Patient temperature HIDE    Sample type VENOUS    Comment NOTIFIED PHYSICIAN   CBG monitoring, ED     Status: Abnormal   Collection Time: 10/04/18  5:53 PM  Result Value Ref Range   Glucose-Capillary 392 (H) 70 - 99 mg/dL   Comment 1 Notify RN    Comment 2 Document in Chart   CBG monitoring, ED     Status: Abnormal   Collection Time: 10/04/18  6:54 PM  Result Value Ref Range   Glucose-Capillary 340 (H) 70 - 99 mg/dL   Comment 1 Notify RN    Comment 2 Document in Chart   CBG monitoring, ED     Status: Abnormal   Collection Time: 10/04/18  7:48 PM  Result Value Ref Range   Glucose-Capillary 249 (H) 70 - 99 mg/dL   No results found.  Pending Labs Unresulted Labs (From admission, onward)    Start     Ordered   10/04/18 1638  Basic metabolic panel  Now  then every 4 hours,   R (with STAT occurrences)     10/04/18 2015   10/04/18 2015  HIV antibody (Routine Testing)  Once,   STAT     10/04/18 2015   10/04/18 1702  SARS Coronavirus 2 ([facility]PHEID [facility] Hosp  Order  (Asymptomatic Patients Labs)  Once,   STAT    Question:  Rule Out  Answer:  Yes   10/04/18 1701   10/04/18 1643  Urinalysis, Routine w reflex microscopic  Once,   STAT     10/04/18 1642          Vitals/Pain Today's Vitals   10/04/18 1640 10/04/18 1646  BP:  136/81  Pulse:  (!) 117  Resp:  20  Temp:  97.7 F (36.5 C)  TempSrc:  Oral  SpO2:  100%  Weight: 79.4 kg   Height: 5\' 10"  (1.778 m)   PainSc: 0-No pain     Isolation Precautions No active isolations  Medications Medications  insulin regular, human (MYXREDLIN) 100 units/ 100 mL infusion (1.9 Units/hr Intravenous Rate/Dose Change 10/04/18 1958)  potassium chloride 10 mEq in 100 mL IVPB (10 mEq Intravenous New Bag/Given 10/04/18 1954)  dextrose 5 %-0.45 % sodium chloride infusion (has no administration in time range)  sodium chloride 0.9 % bolus 500 mL (has no administration in time range)  sodium chloride 0.9 % bolus 1,000 mL (has no administration in time range)  enoxaparin (LOVENOX) injection 40 mg (has no administration in time range)  buPROPion (WELLBUTRIN XL) 24 hr tablet 300 mg (has no administration in time range)  sodium chloride 0.9 % bolus 1,000 mL (1,000 mLs Intravenous New Bag/Given 10/04/18 1701)  ondansetron (ZOFRAN) injection 4 mg (4 mg Intravenous Given 10/04/18 1830)    Mobility walks Low fall risk   Report given to:   Additional Notes: glucosestabilizer

## 2018-10-04 NOTE — ED Notes (Signed)
[place], pts mother wants an update

## 2018-10-04 NOTE — H&P (Addendum)
Date: 10/04/2018               Patient Name:  Raymond MontaneVictor A [name] MRN: [id number]  DOB: [birth year] Age / Sex: 33 y.o., male   PCP: Patient, No Pcp Per         Medical Service: Internal Medicine Teaching Service         Attending Physician: Dr. Rogelia BogaButcher, Austin MilesElizabeth A, MD    First Contact: Dr. Genene Churnarien McNeill, medical student Pager: [phone]  Second Contact: Dr. Randa Lynnaly Santos Pager: [phone]       After Hours (After 5p/  First Contact Pager: [phone]  weekends / holidays): Second Contact Pager: [phone]   Chief Complaint: "high sugars"  History of Present Illness: [name] is a 33 year old M with significant PMH of diabetes, HTN, borderline personality disorder, depression, and anxiety who presented to the ED with hyperglycemia. Pt states he ran out of insulin 2 days ago when he thought he still had enough. He began "feeling bad" and drowsy yesterday evening. He endorses polyuria, polydipsia, shortness of breath, and vomiting. At 11am this morning when he refilled his insulin he took 150 units of Novolin 70/30 every hour for 6 hours. During this time, he describes being disoriented and sleepy only getting up to drink water, vomit, and take more insulin. Describes this as having happened once before a few years ago when he went into DKA and was admitted to the ICU.  ROS - Endorses polyuria, polydipsia, shortness of breath, and vomiting. Denies fevers, chills, chest pain, abdominal pain.  In the ED, his initial blood glucose was 501, with a Na 133, bicarb less than 7, BUN 47, Cr 3.06, and a pH of 7.097 from a venous blood gas. Additionally he had a leukocytosis of 22.6 with an absolute neutrophil count of 19.2, and thrombocytosis of 452. He was given a 1.5L bolus of NS, started on an insulin infusion of 3.3 units/hr  Meds:  Current Meds  Medication Sig  . buPROPion (WELLBUTRIN XL) 150 MG 24 hr tablet Take 300 mg by mouth daily.  Marland Kitchen. NOVOLIN 70/30 RELION (70-30) 100 UNIT/ML injection Inject 35  Units into the skin 2 (two) times a day.     Allergies: Allergies as of 10/04/2018 - Review Complete 10/04/2018  Allergen Reaction Noted  . Ibuprofen Anaphylaxis and Swelling 07/21/2014  . Tramadol Other (See Comments) 07/21/2014   Past Medical History:  Diagnosis Date  . Diabetes mellitus without complication (HCC)    Type 1  . Gallstones     Family History: Mother has bipolar disorder.  Social History: Pt denies cigarette use. He says he used to use alcohol but his last drink was 1 month ago. Additionally, he states he dips, uses marijuana, and cocaine (once a month).   Review of Systems: A complete ROS was negative except as per HPI.  Physical Exam: Blood pressure 136/81, pulse (!) 117, temperature 97.7 F (36.5 C), temperature source Oral, resp. rate 20, height 5\' 10"  (1.778 m), weight 79.4 kg, SpO2 100 %. Physical Exam Vitals signs reviewed.  Constitutional:      General: He is not in acute distress.    Comments: Pt is laying in bed with eyes closed, but awakes and cooperates with questioning.  HENT:     Head: Normocephalic and atraumatic.     Mouth/Throat:     Mouth: Mucous membranes are dry.  Eyes:     Extraocular Movements: Extraocular movements intact.  Cardiovascular:  Rate and Rhythm: Normal rate and regular rhythm.     Heart sounds: Normal heart sounds. No murmur. No friction rub. No gallop.   Pulmonary:     Effort: Pulmonary effort is normal.     Breath sounds: No wheezing, rhonchi or rales.  Abdominal:     General: Bowel sounds are normal. There is no distension.     Palpations: Abdomen is soft.     Tenderness: There is no abdominal tenderness.  Skin:    General: Skin is warm and dry.  Neurological:     General: No focal deficit present.     Mental Status: He is oriented to person, place, and time. He is lethargic.  Psychiatric:        Mood and Affect: Mood normal.     Comments: Pt appears disoriented and forgetful.     Assessment & Plan by  Problem:  Raymond [name] is a 33 year old M with significant PMH of diabetes, HTN, borderline personality disorder, depression, and anxiety who presented to the ED with hyperglycemia, found to be tachycardic and acidotic with an AKI concerning for DKA.   Active Problems DKA (diabetic ketoacidoses) Pt is disoriented during questioning, but appears to have stopped taking his insulin for two days before taking multiple doses of 150 units in attempts to bring his sugars down at home. At this time, he is tachycardic, severely acidotic and dehydrated.  -received KCl 58mEq IV x2 and 1L NS bolus in the ED -ordered for 2 more 1L bolus of NS -continue IV fluids after bolus of NS at 248mL/hr  till CBG < 250 mg/dl then switch to 200 mL/hr D5 1/2NS with KCl 35mEq -on regular insulin drip per DKA protocol -CBC monitoring q1h -BMP q4h -follow-up on UA -follow-up A1c -on continuous cardiac monitoring and pulse ox -strict Is and Os  Pt does have dyspnea and a leukocytosis with elevated neutrophil count, but denies fevers, chills, or cough. Leukocytosis most likely due to DKA causing increased cortisol and catecholamine secretion, but cannot rule out infectious cause. -COVID testing negative -ordered CXR to look for infectious process   AKI Pt's Cr is elevated to 3.06, with normal baseline <1. Most likely prerenal due to pt's severe dehydration from vomiting and DKA. -continue IV fluids -follow-up BMP q4h   Psychiatric history of borderline personality disorder, depression, and anxiety Pt has had numerous hospitalizations in the past for suicide attempts, but doesn't exhibit any SI at this time. -will monitor mood and continue home psych meds (bupropion 150mg )    Diet - NPO DVT ppx - enoxaparin 40mg  subQ daily Fluids - 22mL/hr D5 1/2NS CODE STATUS - FULL CODE    Dispo: Admit patient to Inpatient with expected length of stay greater than 2 midnights.  Signed: Ladona Horns, MD 10/04/2018, 8:15  PM  Pager: [phone]

## 2018-10-04 NOTE — ED Triage Notes (Signed)
Pt bib ems from home with hyperglycemia. Pt endorsed emesis over the last few days. Has been out of his insulin for a couple days. Pt has taken extra humalog 70/30. Took 150units every 30-60 minutes for the last 6 hours. CBG 570. HR 116, rr 24, 100% RA, 98.2, 130/70. 500cc NS given en route.

## 2018-10-05 ENCOUNTER — Encounter (HOSPITAL_COMMUNITY): Payer: Self-pay | Admitting: General Practice

## 2018-10-05 DIAGNOSIS — Z79899 Other long term (current) drug therapy: Secondary | ICD-10-CM

## 2018-10-05 LAB — BASIC METABOLIC PANEL
Anion gap: 14 (ref 5–15)
Anion gap: 10 (ref 5–15)
Anion gap: 9 (ref 5–15)
Anion gap: 8 (ref 5–15)
Anion gap: 8 (ref 5–15)
Anion gap: 8 (ref 5–15)
BUN: 39 mg/dL — ABNORMAL HIGH (ref 6–20)
BUN: 36 mg/dL — ABNORMAL HIGH (ref 6–20)
BUN: 30 mg/dL — ABNORMAL HIGH (ref 6–20)
BUN: 27 mg/dL — ABNORMAL HIGH (ref 6–20)
BUN: 18 mg/dL (ref 6–20)
BUN: 15 mg/dL (ref 6–20)
CO2: 15 mmol/L — ABNORMAL LOW (ref 22–32)
CO2: 18 mmol/L — ABNORMAL LOW (ref 22–32)
CO2: 16 mmol/L — ABNORMAL LOW (ref 22–32)
CO2: 20 mmol/L — ABNORMAL LOW (ref 22–32)
CO2: 19 mmol/L — ABNORMAL LOW (ref 22–32)
CO2: 20 mmol/L — ABNORMAL LOW (ref 22–32)
Calcium: 7.8 mg/dL — ABNORMAL LOW (ref 8.9–10.3)
Calcium: 7.9 mg/dL — ABNORMAL LOW (ref 8.9–10.3)
Calcium: 8.1 mg/dL — ABNORMAL LOW (ref 8.9–10.3)
Calcium: 8.4 mg/dL — ABNORMAL LOW (ref 8.9–10.3)
Calcium: 8.3 mg/dL — ABNORMAL LOW (ref 8.9–10.3)
Calcium: 8.3 mg/dL — ABNORMAL LOW (ref 8.9–10.3)
Chloride: 110 mmol/L (ref 98–111)
Chloride: 110 mmol/L (ref 98–111)
Chloride: 111 mmol/L (ref 98–111)
Chloride: 106 mmol/L (ref 98–111)
Chloride: 110 mmol/L (ref 98–111)
Chloride: 106 mmol/L (ref 98–111)
Creatinine, Ser: 1.68 mg/dL — ABNORMAL HIGH (ref 0.61–1.24)
Creatinine, Ser: 1.49 mg/dL — ABNORMAL HIGH (ref 0.61–1.24)
Creatinine, Ser: 1.07 mg/dL (ref 0.61–1.24)
Creatinine, Ser: 1.12 mg/dL (ref 0.61–1.24)
Creatinine, Ser: 0.91 mg/dL (ref 0.61–1.24)
Creatinine, Ser: 0.83 mg/dL (ref 0.61–1.24)
GFR calc Af Amer: >60 mL/min (ref >60)
GFR calc Af Amer: >60 mL/min (ref >60)
GFR calc Af Amer: >60 mL/min (ref >60)
GFR calc Af Amer: >60 mL/min (ref >60)
GFR calc Af Amer: >60 mL/min (ref >60)
GFR calc Af Amer: >60 mL/min (ref >60)
GFR calc non Af Amer: 53 mL/min — ABNORMAL LOW (ref >60)
GFR calc non Af Amer: >60 mL/min (ref >60)
GFR calc non Af Amer: >60 mL/min (ref >60)
GFR calc non Af Amer: >60 mL/min (ref >60)
GFR calc non Af Amer: >60 mL/min (ref >60)
GFR calc non Af Amer: >60 mL/min (ref >60)
Glucose, Bld: 124 mg/dL — ABNORMAL HIGH (ref 70–99)
Glucose, Bld: 134 mg/dL — ABNORMAL HIGH (ref 70–99)
Glucose, Bld: 78 mg/dL (ref 70–99)
Glucose, Bld: 158 mg/dL — ABNORMAL HIGH (ref 70–99)
Glucose, Bld: 85 mg/dL (ref 70–99)
Glucose, Bld: 223 mg/dL — ABNORMAL HIGH (ref 70–99)
Potassium: 4 mmol/L (ref 3.5–5.1)
Potassium: 4 mmol/L (ref 3.5–5.1)
Potassium: 4.3 mmol/L (ref 3.5–5.1)
Potassium: 3.9 mmol/L (ref 3.5–5.1)
Potassium: 3.6 mmol/L (ref 3.5–5.1)
Potassium: 3.6 mmol/L (ref 3.5–5.1)
Sodium: 139 mmol/L (ref 135–145)
Sodium: 138 mmol/L (ref 135–145)
Sodium: 136 mmol/L (ref 135–145)
Sodium: 134 mmol/L — ABNORMAL LOW (ref 135–145)
Sodium: 137 mmol/L (ref 135–145)
Sodium: 134 mmol/L — ABNORMAL LOW (ref 135–145)

## 2018-10-05 LAB — URINALYSIS, ROUTINE W REFLEX MICROSCOPIC
Bacteria, UA: NONE SEEN
Bilirubin Urine: NEGATIVE
Glucose, UA: >=500 mg/dL — AB
Ketones, ur: 80 mg/dL — AB
Leukocytes,Ua: NEGATIVE
Nitrite: NEGATIVE
Protein, ur: 100 mg/dL — AB
Specific Gravity, Urine: 1.013 (ref 1.005–1.030)
pH: 5 (ref 5.0–8.0)

## 2018-10-05 LAB — GLUCOSE, CAPILLARY
Glucose-Capillary: 101 mg/dL — ABNORMAL HIGH (ref 70–99)
Glucose-Capillary: 108 mg/dL — ABNORMAL HIGH (ref 70–99)
Glucose-Capillary: 125 mg/dL — ABNORMAL HIGH (ref 70–99)
Glucose-Capillary: 134 mg/dL — ABNORMAL HIGH (ref 70–99)
Glucose-Capillary: 140 mg/dL — ABNORMAL HIGH (ref 70–99)
Glucose-Capillary: 142 mg/dL — ABNORMAL HIGH (ref 70–99)
Glucose-Capillary: 150 mg/dL — ABNORMAL HIGH (ref 70–99)
Glucose-Capillary: 167 mg/dL — ABNORMAL HIGH (ref 70–99)
Glucose-Capillary: 187 mg/dL — ABNORMAL HIGH (ref 70–99)
Glucose-Capillary: 226 mg/dL — ABNORMAL HIGH (ref 70–99)
Glucose-Capillary: 86 mg/dL (ref 70–99)
Glucose-Capillary: 90 mg/dL (ref 70–99)
Glucose-Capillary: 90 mg/dL (ref 70–99)
Glucose-Capillary: 92 mg/dL (ref 70–99)

## 2018-10-05 LAB — CK: Total CK: 66 U/L (ref 49–397)

## 2018-10-05 LAB — HIV ANTIBODY (ROUTINE TESTING W REFLEX): HIV Screen 4th Generation wRfx: NONREACTIVE

## 2018-10-05 MED ORDER — INSULIN ASPART PROT & ASPART (70-30 MIX) 100 UNIT/ML ~~LOC~~ SUSP
5.0000 [IU] | Freq: Once | SUBCUTANEOUS | Status: DC
  Filled 2018-10-05: qty 10

## 2018-10-05 MED ORDER — INSULIN ASPART 100 UNIT/ML ~~LOC~~ SOLN
0.0000 [IU] | Freq: Three times a day (TID) | SUBCUTANEOUS | Status: DC
  Administered 2018-10-05: 2 [IU] via SUBCUTANEOUS

## 2018-10-05 MED ORDER — SODIUM CHLORIDE 0.9 % IV BOLUS: 1000.0000 mL | Freq: Once | INTRAVENOUS | Status: DC

## 2018-10-05 MED ORDER — POTASSIUM CHLORIDE 2 MEQ/ML IV SOLN
INTRAVENOUS | Status: DC
  Administered 2018-10-05: 09:00:00 via INTRAVENOUS
  Filled 2018-10-05 (×9): qty 990

## 2018-10-05 MED ORDER — SODIUM CHLORIDE 0.9 % IV BOLUS
1000.0000 mL | Freq: Once | INTRAVENOUS | Status: AC
  Administered 2018-10-05: 1000 mL via INTRAVENOUS

## 2018-10-05 MED ORDER — POTASSIUM CL IN DEXTROSE 5% 20 MEQ/L IV SOLN
20.0000 meq | INTRAVENOUS | Status: DC
  Administered 2018-10-05: 20 meq via INTRAVENOUS
  Filled 2018-10-05 (×2): qty 1000

## 2018-10-05 MED ORDER — DEXTROSE 50 % IV SOLN
1.0000 | Freq: Once | INTRAVENOUS | Status: AC
  Administered 2018-10-05: 50 mL via INTRAVENOUS
  Filled 2018-10-05: qty 50

## 2018-10-05 MED ORDER — DEXTROSE 10 % IV SOLN: INTRAVENOUS | Status: DC

## 2018-10-05 MED ORDER — POTASSIUM CL IN DEXTROSE 5% 20 MEQ/L IV SOLN
20.0000 meq | INTRAVENOUS | Status: DC
  Administered 2018-10-05: 20 meq via INTRAVENOUS
  Filled 2018-10-05: qty 1000

## 2018-10-05 NOTE — Progress Notes (Addendum)
Inpatient Diabetes Program Recommendations  AACE/ADA: New Consensus Statement on Inpatient Glycemic Control (2015)  Target Ranges:  Prepandial:   less than 140 mg/dL      Peak postprandial:   less than 180 mg/dL (1-2 hours)      Critically ill patients:  140 - 180 mg/dL     Review of Glycemic Control  Diabetes history: DM 1 Outpatient Diabetes medications: 70/30 35 units BID Current orders for Inpatient glycemic control: NONE  Inpatient Diabetes Program Recommendations:    Spoke with patient at bedside. Patient has no insurance and had been taking 70/30 35 units bid however had lately increased the dose himself to 45 units qam and 35 units qpm. Patient ran out of insulin 3 days early than expected.  Noted IV insulin turned off per MD. No insulin given at time the time. Patient is now eating.   Paged [facility] and 2nd contact regarding need for insulin.   Patient would benefit from ordering 70/30 bid and to give patient the vial of insulin at time of d/c until he can get more from [facility].  Patient gets free insulin through this clinic. He sees PCP there per CM and per patient. Discussed glucose control with patient. Patient currently does not check his glucose even though he has the supplies. Patient says he knows how his glucose is based on how he feels.  Thanks,  Tama Headings RN, MSN, BC-ADM Inpatient Diabetes Coordinator Team Pager [phone] (8a-5p)

## 2018-10-05 NOTE — Progress Notes (Signed)
Teaching service and IM paged and advised CO2 is 20. Gap is closed and glucose is WNL. Pt is requesting to be started back on his home regimen  if possible. Will await orders.  Jerald Kief, RN

## 2018-10-05 NOTE — Care Management (Signed)
Spoke w patient at bedside. He states that he goes to [facility] (behind [facility]) for primary care. He states that he goes to [facility] (could not remember her last name). He states that he gets his meds through the [facility] and gets reminders from [facility] when they are due to be picked up. He states that was taking his insulin not as prescribed and then therefore ran out thus ending up in DKA. He does not have problems with access to medication, but compliance.  No other CM needs identified at this time.

## 2018-10-05 NOTE — Progress Notes (Addendum)
Teaching service advises hold insulin drip at this time and D10. Check q1 hr blood sugars x2, then possibly ACHS. Pt states he is feeling much better. Blood sugar is within his normal range. Jerald Kief, RN

## 2018-10-05 NOTE — Progress Notes (Addendum)
Subjective:  Today patient reports feeling much better than he did on admission. He denies recurrence of abdominal pain, nausea, or vomiting. He explained that he initially ran out his home insulin 2 days ago after accidentally misjudging his dose. He explains that he has been working longer hours after starting a new job 2 weeks ago. [facility] helps him to obtain his medication while he is working. No complaints of shortness of breath or chest pain.  Objective:  Vital signs in last 24 hours: Vitals:   10/04/18 2116 10/04/18 2255 10/05/18 0512 10/05/18 0738  BP: 106/62 124/75 133/74 109/62  Pulse: (!) 109 (!) 101 87 87  Resp: 19 17 15  (!) 22  Temp: 97.9 F (36.6 C) 98.1 F (36.7 C) 98.2 F (36.8 C) 98 F (36.7 C)  TempSrc: Oral Oral Oral Oral  SpO2: 100% 100% 100% 100%  Weight: 78 kg     Height: 5\' 10"  (1.778 m)      Weight change:   Intake/Output Summary (Last 24 hours) at 10/05/2018 1047 Last data filed at 10/05/2018 0600 Gross per 24 hour  Intake 1981.45 ml  Output 1350 ml  Net 631.45 ml    Ref Range & Units 11:39 09:48 04:04 00:38  Sodium 135 - 145 mmol/L 134Low   136  138  139   Potassium 3.5 - 5.1 mmol/L 3.9  4.3  4.0  4.0   Chloride 98 - 111 mmol/L 106  111  110  110   CO2 22 - 32 mmol/L 20Low   16Low   18Low   15Low    Glucose, Bld 70 - 99 mg/dL 401UUVO158High   78  536UYQI134High   124High    BUN 6 - 20 mg/dL 34VQQV27High   95GLOV30High   56EPPI36High   39High    Creatinine, Ser 0.61 - 1.24 mg/dL 9.511.12  8.841.07  1.49High   1.68High    Calcium 8.9 - 10.3 mg/dL 1.6SAY8.4Low   3.0ZSW8.1Low   1.0XNA7.9Low   7.8Low    GFR calc non Af Amer >60 mL/min >60  >60  >60  53Low    GFR calc Af Amer >60 mL/min >60  >60  >60  >60   Anion gap 5 - 15 8  9  CM  10 CM  14 CM     Ref Range & Units 09:48  Total CK 49 - 397 U/L 66     Ref Range & Units 12:50 11:44 10:05 07:12  Glucose-Capillary 70 - 99 mg/dL 355DDUK142High   025KYHC167High   92  90     Physical Exam  Constitutional: Laying in bed, comfortable appearing.   HENT: Normocephalic and atraumatic.  Cardiovascular: Normal rate, regular rhythm and normal heart sounds.  Respiratory: Normal WOB, clear to auscultation bilaterally.  GI: Nontender, nondistended  Musculoskeletal: No edema.  Neurological: Is alert.  Skin: Not diaphoretic. No erythema.  Psychiatric: Normal mood and affect. Behavior is normal. Judgment and thought content normal.    Assessment/Plan:  Active Problems:   DKA (diabetic ketoacidoses) (HCC)  DKA (diabetic ketoacidoses) Hypoglycemia Patient's mental status has continued to improve since admission. Today he was alert and oriented on exam without pain complaints. His vitals have remained stable and his anion gap has continued to close. Given that he took 150 units of Novolin 70/30 six times yesterday, we have been monitoring his CBG for any potential drops. He initially came in with a glucose of 501 which has dropped as low as 90. His most  recent glucose is 142. We are currently holding his insulin drip and D10 fluids. He is on a carb modified diet. We will continue to monitor his progress with plans to eventually transition to his home insulin regimen pending continued improvement. - Holding continuous KCl 20 mEq/L D10 - Holding insulin drip  - NS bolus as needed - CBG - BMP q4h - follow-up A1c - on continuous cardiac monitoring and pulse ox - strict Is and Os - CXR with no active cardiopulmonary disease  AKI: Creatinine has improved to 1.49 from 3.06 on admission. He is still above his baseline of <1. Will continue to give fluids and trend labs. - CK returned normal at 66. Ordered to evaluate for rhabdomyolysis after UA demonstrated HGB without RBCs - Continue IV fluids - Follow-up BMP q4h  Psychiatric history of borderline personality disorder, depression, and anxiety: Stable - On Wellbutrin 150 mg QD  Diet - NPO DVT ppx - enoxaparin 40mg  subQ daily Fluids - 290mL/hr D5 1/2NS CODE STATUS - FULL CODE  Dispo:  Continued inpatient with length of stay greater than 2 midnights.    LOS: 1 day   Luna Fuse, Medical Student 10/05/2018, 10:47 AM

## 2018-10-05 NOTE — Progress Notes (Signed)
Inpatient Diabetes Program Recommendations  AACE/ADA: New Consensus Statement on Inpatient Glycemic Control (2015)  Target Ranges:  Prepandial:   less than 140 mg/dL      Peak postprandial:   less than 180 mg/dL (1-2 hours)      Critically ill patients:  140 - 180 mg/dL   Lab Results  Component Value Date   GLUCAP 92 10/05/2018   HGBA1C 11.3 (H) 12/30/2016    Review of Glycemic Control  Diabetes history: DM 2 Outpatient Diabetes medications: Lantus 25 units, Novolog 4 units tid Current orders for Inpatient glycemic control: IV insulin  A1c in process  Inpatient Diabetes Program Recommendations:    CO2 16. Recommend IV insulin per DKA protocol until CO2 corrects closer to 20, anion gap and glucose are back in range.   At time of transition, consider Lantus 15 units (0.2 units/kg). Novolog 0-9 units tid + Novolog 0-5 units qhs for bedtime coverage.  Thanks,  Tama Headings RN, MSN, BC-ADM Inpatient Diabetes Coordinator Team Pager [phone] (8a-5p)

## 2018-10-06 DIAGNOSIS — E10649 Type 1 diabetes mellitus with hypoglycemia without coma: Secondary | ICD-10-CM

## 2018-10-06 DIAGNOSIS — J029 Acute pharyngitis, unspecified: Secondary | ICD-10-CM

## 2018-10-06 LAB — HEMOGLOBIN A1C
Hgb A1c MFr Bld: 12.2 % — ABNORMAL HIGH (ref 4.8–5.6)
Hgb A1c MFr Bld: 12.1 % — ABNORMAL HIGH (ref 4.8–5.6)
Mean Plasma Glucose: 303 mg/dL
Mean Plasma Glucose: 301 mg/dL

## 2018-10-06 LAB — BASIC METABOLIC PANEL
Anion gap: 8 (ref 5–15)
Anion gap: 9 (ref 5–15)
BUN: 11 mg/dL (ref 6–20)
BUN: 13 mg/dL (ref 6–20)
CO2: 22 mmol/L (ref 22–32)
CO2: 20 mmol/L — ABNORMAL LOW (ref 22–32)
Calcium: 8.4 mg/dL — ABNORMAL LOW (ref 8.9–10.3)
Calcium: 8.2 mg/dL — ABNORMAL LOW (ref 8.9–10.3)
Chloride: 107 mmol/L (ref 98–111)
Chloride: 99 mmol/L (ref 98–111)
Creatinine, Ser: 0.71 mg/dL (ref 0.61–1.24)
Creatinine, Ser: 0.92 mg/dL (ref 0.61–1.24)
GFR calc Af Amer: >60 mL/min (ref >60)
GFR calc Af Amer: >60 mL/min (ref >60)
GFR calc non Af Amer: >60 mL/min (ref >60)
GFR calc non Af Amer: >60 mL/min (ref >60)
Glucose, Bld: 90 mg/dL (ref 70–99)
Glucose, Bld: 431 mg/dL — ABNORMAL HIGH (ref 70–99)
Potassium: 2.8 mmol/L — ABNORMAL LOW (ref 3.5–5.1)
Potassium: 4.6 mmol/L (ref 3.5–5.1)
Sodium: 137 mmol/L (ref 135–145)
Sodium: 128 mmol/L — ABNORMAL LOW (ref 135–145)

## 2018-10-06 LAB — GLUCOSE, CAPILLARY
Glucose-Capillary: 40 mg/dL — CL (ref 70–99)
Glucose-Capillary: 57 mg/dL — ABNORMAL LOW (ref 70–99)
Glucose-Capillary: 107 mg/dL — ABNORMAL HIGH (ref 70–99)
Glucose-Capillary: 163 mg/dL — ABNORMAL HIGH (ref 70–99)
Glucose-Capillary: 248 mg/dL — ABNORMAL HIGH (ref 70–99)
Glucose-Capillary: 460 mg/dL — ABNORMAL HIGH (ref 70–99)
Glucose-Capillary: 253 mg/dL — ABNORMAL HIGH (ref 70–99)

## 2018-10-06 MED ORDER — INSULIN ASPART 100 UNIT/ML ~~LOC~~ SOLN: 0.0000 [IU] | SUBCUTANEOUS | Status: DC

## 2018-10-06 MED ORDER — INSULIN ASPART 100 UNIT/ML ~~LOC~~ SOLN
0.0000 [IU] | SUBCUTANEOUS | Status: DC
  Administered 2018-10-06: 15 [IU] via SUBCUTANEOUS

## 2018-10-06 MED ORDER — INSULIN GLARGINE 100 UNIT/ML ~~LOC~~ SOLN
3.0000 [IU] | Freq: Once | SUBCUTANEOUS | Status: AC
  Administered 2018-10-06: 3 [IU] via SUBCUTANEOUS
  Filled 2018-10-06 (×2): qty 0.03

## 2018-10-06 MED ORDER — INSULIN GLARGINE 100 UNIT/ML ~~LOC~~ SOLN
2.0000 [IU] | Freq: Once | SUBCUTANEOUS | Status: DC
  Filled 2018-10-06: qty 0.02

## 2018-10-06 MED ORDER — INSULIN GLARGINE 100 UNIT/ML ~~LOC~~ SOLN
8.0000 [IU] | Freq: Every day | SUBCUTANEOUS | Status: DC
  Administered 2018-10-06: 8 [IU] via SUBCUTANEOUS
  Filled 2018-10-06: qty 0.08

## 2018-10-06 MED ORDER — POTASSIUM CHLORIDE CRYS ER 20 MEQ PO TBCR
40.0000 meq | EXTENDED_RELEASE_TABLET | Freq: Two times a day (BID) | ORAL | Status: AC
  Administered 2018-10-06 (×2): 40 meq via ORAL
  Filled 2018-10-06 (×2): qty 2

## 2018-10-06 MED ORDER — KCL IN DEXTROSE-NACL 20-5-0.45 MEQ/L-%-% IV SOLN
INTRAVENOUS | Status: DC
  Administered 2018-10-06: 07:00:00 via INTRAVENOUS
  Filled 2018-10-06: qty 1000

## 2018-10-06 MED ORDER — DEXTROSE-NACL 5-0.45 % IV SOLN: INTRAVENOUS | Status: DC

## 2018-10-06 MED ORDER — SODIUM CHLORIDE 0.9 % IV BOLUS
500.0000 mL | Freq: Once | INTRAVENOUS | Status: AC
  Administered 2018-10-06: 500 mL via INTRAVENOUS

## 2018-10-06 NOTE — Progress Notes (Signed)
Hypoglycemic Event  CBG: 40    Treatment: 8 oz juice/soda  Symptoms: None  Follow-up CBG: [id number] CBG Result:57 .Marland KitchenMarland KitchenPt breakfast just arrived to unit will recheck after meal and pass on to day shift RN  Possible Reasons for Event: medication regimen  Comments/MD notified:ordered D5 0.45% NS with 20K @ 50    Daiden Coltrane M

## 2018-10-06 NOTE — Progress Notes (Signed)
Subjective:  No acute events overnight. His glucose dropped to 40 around 6 AM today but patient remained asymptomatic while laying in bed. He normally notices when his sugars drop while working as he becomes more fatigued and starts feeling shaky. Yesterday he was given 2 units of novolog around 6PM. He has not any nausea/vomiting or abdominal pain but does endorse a burning sensation in his throat for the past 2 days when eating. He contributes these symptoms to irritation from his initial bouts of vomiting on day of admission. He has experienced similar symptoms in the past following episodes of self-induced vomiting which he reports no longer occur. He denies constipation, diarrhea, chest pain, shortness of breath.  Objective:  Vital signs in last 24 hours: Vitals:   10/05/18 2358 10/06/18 0408 10/06/18 0848 10/06/18 1208  BP: (!) 157/98 (!) 147/95  (!) 146/101  Pulse: 87 91 87 95  Resp: 13 19 (!) 24 20  Temp: 98.2 F (36.8 C) 98 F (36.7 C)    TempSrc: Oral Oral Oral   SpO2: 100% 100%  100%  Weight:  80.6 kg    Height:       Weight change: 1.27 kg  Intake/Output Summary (Last 24 hours) at 10/06/2018 1354 Last data filed at 10/06/2018 0900 Gross per 24 hour  Intake 1134.1 ml  Output 1050 ml  Net 84.1 ml     Ref Range & Units 11:59 08:08 06:55 06:21  Glucose-Capillary 70 - 99 mg/dL 564PPIR163High   518ACZY107High   60YTK57Low   40Low Panic      Ref Range & Units 02:54 (10/06/18) 1d ago (10/05/18) 1d ago (10/05/18) 1d ago (10/05/18)   Sodium 135 - 145 mmol/L 137  134Low   137  134Low    Potassium 3.5 - 5.1 mmol/L 2.8Low   3.6  3.6  3.9   Comment: DELTA CHECK NOTED  Chloride 98 - 111 mmol/L 107  106  110  106   CO2 22 - 32 mmol/L 22  20Low   19Low   20Low    Glucose, Bld 70 - 99 mg/dL 90  160FUXN223High   85  235TDDU158High    BUN 6 - 20 mg/dL 11  15  18   27High    Creatinine, Ser 0.61 - 1.24 mg/dL 2.020.71  5.420.83  7.060.91  2.371.12   Calcium 8.9 - 10.3 mg/dL 6.2GBT8.4Low   5.1VOH8.3Low   6.0VPX8.3Low   8.4Low    GFR calc non Af Amer  >60 mL/min >60  >60  >60  >60   GFR calc Af Amer >60 mL/min >60  >60  >60  >60   Anion gap 5 - 15 8  8  CM  8 CM  8 CM     Ref Range & Units 1d ago 2d ago 7571yr ago  Hgb A1c MFr Bld 4.8 - 5.6 % 12.1High   12.2High  CM  11.3High      Physical Exam  Constitutional: Laying in bed, comfortable appearing.  HENT: Normocephalic and atraumatic.  Cardiovascular: Normal rate, regular rhythm and normal heart sounds.  Respiratory: Normal WOB, clear to auscultation bilaterally.  GI: Nontender, nondistended  Musculoskeletal: No edema.  Neurological: Is alert.  Skin: Not diaphoretic. No erythema.  Psychiatric: Normal mood and affect. Behavior is normal. Judgment and thought content normal.    Assessment/Plan:  Principal Problem:   Hypoglycemia due to type 1 diabetes mellitus (HCC) Active Problems:   DKA (diabetic ketoacidoses) (HCC)  DKA (diabetic ketoacidoses) Hypoglycemia He has continued to  do well since admission and has remained alert and oriented on exam. Vitals have remained stable aside from elevation in BP. His anion gap has improved. A1c returned at 12.1 indicating poor long-term management. His drop in glucose to 40 this morning is concerning that he may still have some residual effects from his multiple home insulin dosings prior to admission. The drop is likely not related to his 2 units of novolog given 12 hours prior to the drop in his glucose. At the time of his drop he had been off of his D10 fluids for ~ 16 hours. He is on a carb modified diet and has been tolerating PO intake. He also had a drop in potassium to 2.8. Today we switched him to D5 1/2 NS w/ KCl 20 mEq/L @ 80mL/h along with additional supplementation with oral potassium. Given that he is a type 1 diabetic, he would benefit from a couple of units of lantus in order to prevent reoccurrence of DKA. We will give this once his glucose improves. We are still holding his insulin drip and will continue to monitor with goal of  eventually transitioning to his home diabetes regimen. - Started D5 1/2NS w/ KCl 20 mEq/L @ 53mL/h - NS bolus as needed - CBG - BMP q4h - on continuous cardiac monitoring and pulse ox - strict Is and Os  AKI: Creatinine has improved to 0.71 from 3.06 on admission. Will continue to give fluids and trend labs. - Continue IV fluids - Follow-up BMP q4h  Psychiatric history of borderline personality disorder, depression, and anxiety: Stable - On Wellbutrin 150 mg QD  Diet - NPO DVT ppx - enoxaparin 40mg  subQ daily Fluids - 220mL/hr D5 1/2NS CODE STATUS - FULL CODE  Dispo: Continued inpatient with expected discharge in 1-2 days   LOS: 2 days   Luna Fuse, Medical Student 10/06/2018, 1:54 PM

## 2018-10-07 LAB — BASIC METABOLIC PANEL
Anion gap: 7 (ref 5–15)
BUN: 9 mg/dL (ref 6–20)
CO2: 26 mmol/L (ref 22–32)
Calcium: 8.8 mg/dL — ABNORMAL LOW (ref 8.9–10.3)
Chloride: 105 mmol/L (ref 98–111)
Creatinine, Ser: 0.7 mg/dL (ref 0.61–1.24)
GFR calc Af Amer: >60 mL/min (ref >60)
GFR calc non Af Amer: >60 mL/min (ref >60)
Glucose, Bld: 57 mg/dL — ABNORMAL LOW (ref 70–99)
Potassium: 3.7 mmol/L (ref 3.5–5.1)
Sodium: 138 mmol/L (ref 135–145)

## 2018-10-07 LAB — GLUCOSE, CAPILLARY
Glucose-Capillary: 104 mg/dL — ABNORMAL HIGH (ref 70–99)
Glucose-Capillary: 112 mg/dL — ABNORMAL HIGH (ref 70–99)
Glucose-Capillary: 228 mg/dL — ABNORMAL HIGH (ref 70–99)
Glucose-Capillary: 288 mg/dL — ABNORMAL HIGH (ref 70–99)
Glucose-Capillary: 60 mg/dL — ABNORMAL LOW (ref 70–99)

## 2018-10-07 MED ORDER — INSULIN GLARGINE 100 UNIT/ML ~~LOC~~ SOLN
5.0000 [IU] | Freq: Every day | SUBCUTANEOUS | Status: DC
  Filled 2018-10-07: qty 0.05

## 2018-10-07 MED ORDER — INSULIN ASPART 100 UNIT/ML ~~LOC~~ SOLN
0.0000 [IU] | Freq: Three times a day (TID) | SUBCUTANEOUS | Status: DC
  Administered 2018-10-07: 2 [IU] via SUBCUTANEOUS
  Administered 2018-10-07: 3 [IU] via SUBCUTANEOUS

## 2018-10-07 NOTE — Progress Notes (Signed)
D/C instruction given to patient. Medication details including insulin administration reviewed with patient. All questions answered. IV removed, clean and intact. Pt to escort himself via bus.  Clyde Canterbury, RN

## 2018-10-07 NOTE — Progress Notes (Signed)
Updated pt mother via phone w/ patient permission.  Clyde Canterbury, RN

## 2018-10-07 NOTE — Progress Notes (Signed)
   Subjective:  No acute events overnight.  There is a recorded blood glucose of 60 at 4AM.  He was asymptomatic.  Blood glucose has been stable since then.  Discussed staying in the hospital 1 more day to continue monitoring blood glucose, but patient states he has things to do that he has made other plans and would like to go home today.    Objective:  Vital signs in last 24 hours: Vitals:   10/06/18 2008 10/06/18 2359 10/07/18 0407 10/07/18 0819  BP: (!) 140/95 (!) 145/98 (!) 154/100 (!) 143/91  Pulse: 100 91 88 81  Resp: (!) 23 12 19 14   Temp: 98.6 F (37 C) 98.3 F (36.8 C) 97.9 F (36.6 C) 97.8 F (36.6 C)  TempSrc: Oral Oral Oral Oral  SpO2: 100% 100% 100% 96%  Weight:      Height:       General: Well-appearing male, sitting in bed in no acute distress Cardiac: regular rate and rhythm, nl S1/S2, no murmurs, rubs or gallops  Pulm: CTAB, no wheezes or crackles, no increased work of breathing  Ext: warm and well perfused, no peripheral edema   Assessment/Plan:  Principal Problem:   Hypoglycemia due to type 1 diabetes mellitus (Raymond [name]) Active Problems:   DKA (diabetic ketoacidoses) (Raymond [name])  # Hypoglycemia # T1DM  Patient received 3 units of Lantus yesterday.  He also had 15 units of NovoLog in addition to Lantus after his blood glucose went up to 468. BMP at the time without acidosis or opened gap.  This morning his blood glucose was 60.  He was asymptomatic.  Ideally we would continue to monitor his blood glucose in a controlled setting, but patient would like to go home today. Dr. Rebeca [name] and I discussed extreme importance of close blood glucose monitoring at home and decreasing the dose of Novolin 70/30 while his BG stabilizes. He uses 35 units BID. We discussed doing 15 units BID for now, maybe less at nighttime since his hypoglycemia is usually in the morning. He can uptirate as needed. I spoke with his mother, Raymond [name], and explained the plan. She is in agreement. She is  working on scheduling an appointment with his PCP. She will also monitor him at night and early in the morning as his hypoglycemia has been asymptomatic.   # DKA: Resolved.    Dispo: Anticipated discharge today.   Raymond Roche, MD 10/07/2018, 11:59 AM Pager: [phone]

## 2018-10-07 NOTE — Progress Notes (Signed)
Hypoglycemic Event  CBG:60  Treatment: 4 oz juice/soda  Symptoms: None  Follow-up CBG: [id number] CBG Result:112  Possible Reasons for Event: medication regimen      Yutaka Holberg M

## 2018-10-08 NOTE — Discharge Summary (Signed)
Name: Raymond MontaneVictor A [name] MRN: [id number] DOB: [birth year] 33 y.o. PCP: Patient, No Pcp Per  Date of Admission: 10/04/2018  4:28 PM Date of Discharge: 10/07/2018 Attending Physician: Dr. Rogelia BogaButcher   Discharge Diagnosis: 1: DKA (diabetic ketoacidoses) 2: Hypoglycemia 3: Acute Kidney Injury  Discharge Medications: Allergies as of 10/07/2018      Reactions   Ibuprofen Anaphylaxis, Swelling   Facial swelling   Tramadol Other (See Comments)   Hypoglycemic Shock      Medication List    STOP taking these medications   ARIPiprazole 5 MG tablet Commonly known as: ABILIFY   FLUoxetine 20 MG capsule Commonly known as: PROZAC   insulin aspart 100 UNIT/ML injection Commonly known as: novoLOG   insulin glargine 100 UNIT/ML injection Commonly known as: LANTUS   metFORMIN 500 MG tablet Commonly known as: GLUCOPHAGE   mirtazapine 7.5 MG tablet Commonly known as: REMERON   traZODone 50 MG tablet Commonly known as: DESYREL     TAKE these medications   buPROPion 150 MG 24 hr tablet Commonly known as: WELLBUTRIN XL Take 300 mg by mouth daily. Notes to patient: Tomorrow morning 7/12   lisinopril 5 MG tablet Commonly known as: ZESTRIL Take 1 tablet (5 mg total) by mouth daily. For high blood pressure What changed: Another medication with the same name was removed. Continue taking this medication, and follow the directions you see here. Notes to patient: Tomorrow morning 7/12   NovoLIN 70/30 ReliOn (70-30) 100 UNIT/ML injection Generic drug: insulin NPH-regular Human Inject 35 Units into the skin 2 (two) times a day. Notes to patient: This evening 7/11       Disposition and follow-up:   [name].  At the hospital follow up visit please address:  1.  Please follow up patient blood glucose closely. He was discharged home (against our recommendation) on lower dose of Novolin due to persistent  hypoglycemia in AM.   2.  Labs / imaging needed at time of follow-up: None   3.  Pending labs/ test needing follow-up: None    Follow-up Appointments: Patient will schedule appt with PCP.    Hospital Course by problem list:  DKA Hypoglycemia Patient presented to the ED with dyspnea, polyuria, polydipsia, and vomiting. ED work-up was consistent with DKA and management was initiated with fluids and insulin drip with quick resolution of DKA. His hospital course was complicated by persistent hypoglycemia requiring D10 gtt. This was thought to be secondary to insulin stacking from the large amounts of Novolin he used the day prior to admission (150 units of Novolin 70/30 every hour for 6 hours x6). We were unable to transition him to SQ insulin due to persistent hypoglycemia. On morning of discharge patient was once again hypoglycemic but remained asymptomatic. We urged him to stay so we could slowly titrate his insulin, but he declined. He was discharged home with extensive safety precautions, low insulin dose (Novolin 15 BID), frequent glucose monitoring, and close supervision by his mother. HE was advised to follow up closely with PCP for insulin titration.   AKI: This was secondary to dehydration from DKA and resolved with IVF.    Discharge Vitals:   BP (!) 153/101 (BP Location: Right Arm)    Pulse 81    Temp 98.3 F (36.8 C) (Oral)    Resp 17    Ht 5\' 10"  (1.778 m)    Wt 80.6 kg    SpO2 98%  BMI 25.51 kg/m   Pertinent Labs, Studies, and Procedures:    Ref Range & Units 1d ago (10/07/18) 2d ago (10/06/18) 2d ago (10/06/18) 3d ago (10/05/18)  Sodium 135 - 145 mmol/L 138  128Low  CM  137  134Low    Comment: DELTA CHECK NOTED  Potassium 3.5 - 5.1 mmol/L 3.7  4.6 CM  2.8Low  CM  3.6   Comment: DELTA CHECK NOTED  Chloride 98 - 111 mmol/L 105  99  107  106   CO2 22 - 32 mmol/L 26  20Low   22  20Low    Glucose, Bld 70 - 99 mg/dL 57Low   431High   90  223High    BUN 6 - 20 mg/dL 9  13  11   15    Creatinine, Ser 0.61 - 1.24 mg/dL 0.70  0.92  0.71  0.83   Calcium 8.9 - 10.3 mg/dL 8.8Low   8.2Low   8.4Low   8.3Low    GFR calc non Af Amer >60 mL/min >60  >60  >60  >60   GFR calc Af Amer >60 mL/min >60  >60  >60  >60   Anion gap 5 - 15 7  9  CM  8 CM  8 CM     Ref Range & Units 3d ago 4d ago 56yr ago  Hgb A1c MFr Bld 4.8 - 5.6 % 12.1High   12.2High  CM  11.3High  CM     Ref Range & Units (10/05/2018)  Total CK 49 - 397 U/L 66      Ref Range & Units 4d ago (10/04/18) 70yr ago (07/21/14) 28yr ago (07/21/14) 18yr ago (07/21/14)  WBC 4.0 - 10.5 K/uL 22.6High   6.5  6.1    RBC 4.22 - 5.81 MIL/uL 4.51  4.40  4.20Low     Hemoglobin 13.0 - 17.0 g/dL 13.8  12.8Low   12.4Low  CM  15.6   HCT 39.0 - 52.0 % 40.5  38.0Low   36.1Low   46.0   MCV 80.0 - 100.0 fL 89.8  86.4 R  86.0 R    MCH 26.0 - 34.0 pg 30.6  29.1  29.5    MCHC 30.0 - 36.0 g/dL 34.1  33.7  34.3    RDW 11.5 - 15.5 % 11.9  12.1  12.0    Platelets 150 - 400 K/uL 452High   313  298    nRBC 0.0 - 0.2 % 0.0      Neutrophils Relative % % 85   45 R    Neutro Abs 1.7 - 7.7 K/uL 19.2High    2.8    Lymphocytes Relative % 9   45 R    Lymphs Abs 0.7 - 4.0 K/uL 2.0   2.8    Monocytes Relative % 4   8 R    Monocytes Absolute 0.1 - 1.0 K/uL 0.9   0.5    Eosinophils Relative % 0   2 R    Eosinophils Absolute 0.0 - 0.5 K/uL 0.0   0.1 R    Basophils Relative % 0   1 R    Basophils Absolute 0.0 - 0.1 K/uL 0.1   0.0    Immature Granulocytes % 2      Abs Immature Granulocytes 0.00 - 0.07 K/uL 0.46High         Ref Range & Units 4d ago (10/04/18) 4d ago (10/04/18) 4d ago (10/04/18) 38yr ago (12/30/16)  pH, Ven 7.250 - 7.430 7.097Low Panic  pCO2, Ven 44.0 - 60.0 mmHg 19.6Low Panic       pO2, Ven 32.0 - 45.0 mmHg 44.0      Bicarbonate 20.0 - 28.0 mmol/L 6.0Low       TCO2 22 - 32 mmol/L 7Low       O2 Saturation % 64.0      Acid-base deficit 0.0 - 2.0 mmol/L 22.0High       Sodium 135 - 145 mmol/L 131Low   133Low    139   Potassium 3.5 - 5.1  mmol/L 4.8  4.9   4.1   Calcium, Ion 1.15 - 1.40 mmol/L 1.22      HCT 39.0 - 52.0 % 41.0   40.5    Hemoglobin 13.0 - 17.0 g/dL 16.113.9   09.613.8    Patient temperature  HIDE      Sample type  VENOUS        CXR (10/04/2018) IMPRESSION: No radiographic evidence for active cardiopulmonary disease.  Discharge Instructions: Discharge Instructions    Call MD for:  difficulty breathing, headache or visual disturbances   Complete by: As directed    Call MD for:  extreme fatigue   Complete by: As directed    Call MD for:  persistant dizziness or light-headedness   Complete by: As directed    Call MD for:  persistant nausea and vomiting   Complete by: As directed    Diet - low sodium heart healthy   Complete by: As directed    Discharge instructions   Complete by: As directed    Raymond [name],   You were admitted with DKA that resolved rapidly.  However, during this admission you had issues with low blood sugar.  This was due to the high dose of Novolin 70/30 that you used prior to coming into the hospital.  It may take a few more days for your body to clear all this insulin and you may continue to have hypoglycemia. It will SUPER important to check your blood sugar frequently at home, even more frequently than usual.  We recommend that you decrease your Novolin dose to 15 units twice a day, maybe even lower at nighttime (around 10 units) since your low blood sugar tends to be in the morning.  Have your mom check up with you at night and early in the morning.  Please make a follow-up appointment with your primary care doctor as you will need close monitoring of your blood sugar returns to normal.  Call us if you have any questions or concerns.  - Dr. Evelene CroonSantos   Increase activity slowly   Complete by: As directed       Signed: Cristopher EstimableMcNeill, Darien L, Medical Student 10/08/2018, 11:09 AM

## 2020-01-31 ENCOUNTER — Emergency Department (HOSPITAL_COMMUNITY)
Admission: EM | Admit: 2020-01-31 | Discharge: 2020-01-31 | Disposition: A | Discharge status: Home / Self Care | Payer: Self-pay | Attending: Emergency Medicine | Admitting: Emergency Medicine

## 2020-01-31 ENCOUNTER — Encounter (HOSPITAL_COMMUNITY): Payer: Self-pay | Admitting: Emergency Medicine

## 2020-01-31 ENCOUNTER — Other Ambulatory Visit: Payer: Self-pay

## 2020-01-31 DIAGNOSIS — L03113 Cellulitis of right upper limb: Secondary | ICD-10-CM

## 2020-01-31 DIAGNOSIS — L03011 Cellulitis of right finger: Secondary | ICD-10-CM | POA: Insufficient documentation

## 2020-01-31 DIAGNOSIS — L0291 Cutaneous abscess, unspecified: Secondary | ICD-10-CM

## 2020-01-31 DIAGNOSIS — E10649 Type 1 diabetes mellitus with hypoglycemia without coma: Secondary | ICD-10-CM | POA: Insufficient documentation

## 2020-01-31 DIAGNOSIS — E1010 Type 1 diabetes mellitus with ketoacidosis without coma: Secondary | ICD-10-CM | POA: Insufficient documentation

## 2020-01-31 DIAGNOSIS — L02511 Cutaneous abscess of right hand: Secondary | ICD-10-CM | POA: Insufficient documentation

## 2020-01-31 DIAGNOSIS — F17220 Nicotine dependence, chewing tobacco, uncomplicated: Secondary | ICD-10-CM | POA: Insufficient documentation

## 2020-01-31 LAB — CBC
HCT: 36.9 % — ABNORMAL LOW (ref 39.0–52.0)
Hemoglobin: 12.7 g/dL — ABNORMAL LOW (ref 13.0–17.0)
MCH: 30.2 pg (ref 26.0–34.0)
MCHC: 34.4 g/dL (ref 30.0–36.0)
MCV: 87.9 fL (ref 80.0–100.0)
Platelets: 366 10*3/uL (ref 150–400)
RBC: 4.2 MIL/uL — ABNORMAL LOW (ref 4.22–5.81)
RDW: 11.7 % (ref 11.5–15.5)
WBC: 12.1 10*3/uL — ABNORMAL HIGH (ref 4.0–10.5)
nRBC: 0 % (ref 0.0–0.2)

## 2020-01-31 LAB — BASIC METABOLIC PANEL
Anion gap: 8 (ref 5–15)
BUN: 19 mg/dL (ref 6–20)
CO2: 25 mmol/L (ref 22–32)
Calcium: 8.6 mg/dL — ABNORMAL LOW (ref 8.9–10.3)
Chloride: 102 mmol/L (ref 98–111)
Creatinine, Ser: 1 mg/dL (ref 0.61–1.24)
GFR, Estimated: >60 mL/min (ref >60)
Glucose, Bld: 283 mg/dL — ABNORMAL HIGH (ref 70–99)
Potassium: 3.6 mmol/L (ref 3.5–5.1)
Sodium: 135 mmol/L (ref 135–145)

## 2020-01-31 MED ORDER — CEPHALEXIN 500 MG PO CAPS: 500.0000 mg | ORAL_CAPSULE | Freq: Four times a day (QID) | ORAL | 0 refills | Status: AC

## 2020-01-31 MED ORDER — PIPERACILLIN-TAZOBACTAM 3.375 G IVPB 30 MIN
3.3750 g | Freq: Once | INTRAVENOUS | Status: AC
  Administered 2020-01-31: 3.375 g via INTRAVENOUS
  Filled 2020-01-31: qty 50

## 2020-01-31 MED ORDER — DOXYCYCLINE HYCLATE 100 MG PO CAPS: 100.0000 mg | ORAL_CAPSULE | Freq: Two times a day (BID) | ORAL | 0 refills | Status: AC

## 2020-01-31 MED ORDER — LIDOCAINE-EPINEPHRINE 1 %-1:100000 IJ SOLN
20.0000 mL | Freq: Once | INTRAMUSCULAR | Status: AC
  Administered 2020-01-31: 20 mL
  Filled 2020-01-31: qty 1

## 2020-01-31 MED ORDER — HYDROCODONE-ACETAMINOPHEN 5-325 MG PO TABS
1.0000 | ORAL_TABLET | Freq: Once | ORAL | Status: AC
  Administered 2020-01-31: 1 via ORAL
  Filled 2020-01-31: qty 1

## 2020-01-31 NOTE — Discharge Instructions (Addendum)
Return to the emergency department in 2 days to have the packing removed and for a wound check.  Return sooner if you have redness streaking outside of the circled area, fever over 100.4 not controlled with Tylenol, bleeding that you cannot control after applying pressure for 20 minutes, or any other signs of infection you are concerned about.  Keep the area clean and dry for the next 24 hours.  After that wash with soap and water at least twice daily.  You will need to cover the area anytime you are working or if you are doing anything that might get it dirty.  Return to the emergency department for any worsening symptoms.

## 2020-01-31 NOTE — ED Provider Notes (Signed)
[facility] EM[facility]ENC[facility] Provider Note   CSN: [id number] Arrival date & time: 01/31/20  1554     History Chief Complaint  Patient presents with  . hand swelling    Melissa MontaneVictor A Beecham is a 34 y.o. right hand dominant male with past medical history significant for borderline personality disorder, type 1 diabetes, substance abuse.  HPI Patient presents to emergency department today with chief complaint of right hand swelling x2 weeks.  He states he works in [employer] and got an abrasion on his right wrist.  The wound did not seem to be healing and has slowly become more swollen with increasing pain.  He is describing the pain as sharp and aching.  Pain radiates to his hand and elbow.  He rates the pain at 7 out of 10 in severity.  He reports there has been purulent drainage from the wound.  He has tried to drain it with unused insulin needles as well with little success. He has not taken any medications for his pain prior to arrival. He tells me his history of substance abuse is smoking meth, never IVDA. Denies fever, chills, numbness, tingling, weakness.     Past Medical History:  Diagnosis Date  . Borderline personality disorder (HCC)   . Diabetes mellitus without complication (HCC)    Type 1  . Gallstones     Patient Active Problem List   Diagnosis Date Noted  . Hypoglycemia due to type 1 diabetes mellitus (HCC) 10/06/2018  . DKA (diabetic ketoacidoses) 10/04/2018  . 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 Episodic substance abuse 07/23/2014  . Severe recurrent major depression without psychotic features (HCC) 07/22/2014  . Suicide attempt [facility](HCC)     Past Surgical History:  Procedure Laterality Date  . CHOLECYSTECTOMY         No family history on file.  Social History   Tobacco Use  . Smoking status: Never Smoker  . Smokeless tobacco: Current User    Types: Chew  Vaping Use  . Vaping Use:  Never used  Substance Use Topics  . Alcohol use: Yes    Alcohol/week: 1.0 - 2.0 standard drink    Types: 1 - 2 Cans of beer per week    Comment: few times a week  . Drug use: Yes    Types: Other-see comments    Comment: OTC cough medicine    Home Medications Prior to Admission medications   Medication Sig Start Date End Date Taking? Authorizing Provider  atorvastatin (LIPITOR) 10 MG tablet Take 10 mg by mouth at bedtime. 11/30/19  Yes [provider]  lisinopril (ZESTRIL) 10 MG tablet Take 10 mg by mouth daily. 11/30/19  Yes [provider]  Multiple Vitamins-Minerals (ADVANCED DIABETIC MULTIVITAMIN PO) Take 1 tablet by mouth daily.   Yes [provider]  NOVOLIN 70/30 RELION (70-30) 100 UNIT/ML injection Inject 35 Units into the skin 2 (two) times a day. 07/06/18  Yes [provider]  cephALEXin (KEFLEX) 500 MG capsule Take 1 capsule (500 mg total) by mouth 4 (four) times daily for 7 days. 02/01/20 02/08/20  Walisiewicz, Caroleen HammanKaitlyn E, PA-C  doxycycline (VIBRAMYCIN) 100 MG capsule Take 1 capsule (100 mg total) by mouth 2 (two) times daily for 7 days. 02/01/20 02/08/20  Shanon AceWalisiewicz, Cesia Orf E, PA-C    Allergies    Ibuprofen and Tramadol  Review of Systems   Review of Systems All other systems are reviewed and are negative for acute change  except as noted in the HPI.  Physical Exam Updated Vital Signs BP (!) 139/98 (BP Location: Left Arm)   Pulse (!) 103   Temp 98.1 F (36.7 C) (Oral)   Resp 18   Ht 5\' 10"  (1.778 m)   Wt 78 kg   SpO2 100%   BMI 24.68 kg/m   Physical Exam Vitals and nursing note reviewed.  Constitutional:      General: He is not in acute distress.    Appearance: He is not ill-appearing.  HENT:     Head: Normocephalic and atraumatic.     Right Ear: Tympanic membrane and external ear normal.     Left Ear: Tympanic membrane and external ear normal.     Nose: Nose normal.     Mouth/Throat:     Mouth: Mucous membranes are moist.       Pharynx: Oropharynx is clear.  Eyes:     General: No scleral icterus.       Right eye: No discharge.        Left eye: No discharge.     Extraocular Movements: Extraocular movements intact.     Conjunctiva/sclera: Conjunctivae normal.     Pupils: Pupils are equal, round, and reactive to light.  Neck:     Vascular: No JVD.  Cardiovascular:     Rate and Rhythm: Normal rate and regular rhythm.     Pulses: Normal pulses.          Radial pulses are 2+ on the right side and 2+ on the left side.     Heart sounds: Normal heart sounds.     Comments: HR during exam in the 80s Pulmonary:     Comments: Lungs clear to auscultation in all fields. Symmetric chest rise. No wheezing, rales, or rhonchi. Abdominal:     Comments: Abdomen is soft, non-distended, and non-tender in all quadrants. No rigidity, no guarding. No peritoneal signs.  Musculoskeletal:        General: Normal range of motion.     Cervical back: Normal range of motion.     Comments: Decreased ROM of left wrist 2/2 pain although still able to flex and extend wrist slowly.  Swelling to dorsum of left hand. Strong and equal grip strengths in bilateral upper extremities. Brisk cap refill.  Full ROM of left elbow.  Skin:    General: Skin is warm and dry.     Capillary Refill: Capillary refill takes less than 2 seconds.     Comments: No track marks seen on visible extremities.  Please see media below. 5x6 cm area of erythema to base of posterior left wrist. There is a scabbed center with dried purulent drainage. Area is indurated, center with palpable fluctuance.  Neurological:     Mental Status: He is oriented to person, place, and time.     GCS: GCS eye subscore is 4. GCS verbal subscore is 5. GCS motor subscore is 6.     Comments: Fluent speech, no facial droop.  Psychiatric:        Behavior: Behavior normal.       ED Results / Procedures / Treatments   Labs (all labs ordered are listed, but only abnormal results are  displayed) Labs Reviewed  CBC - Abnormal; Notable for the following components:      Result Value   WBC 12.1 (*)    RBC 4.20 (*)    Hemoglobin 12.7 (*)    HCT 36.9 (*)    All other components within  normal limits  BASIC METABOLIC PANEL - Abnormal; Notable for the following components:   Glucose, Bld 283 (*)    Calcium 8.6 (*)    All other components within normal limits  AEROBIC CULTURE (SUPERFICIAL SPECIMEN)    EKG None  Radiology No results found.  Procedures .Marland KitchenIncision and Drainage  Date/Time: 01/31/2020 9:03 PM Performed by: Shanon Ace, PA-C Authorized by: Shanon Ace, PA-C   Consent:    Consent obtained:  Verbal   Consent given by:  Patient   Risks discussed:  Bleeding, infection and incomplete drainage   Alternatives discussed:  No treatment Location:    Type:  Abscess   Location:  Upper extremity   Upper extremity location:  Wrist   Wrist location:  R wrist Pre-procedure details:    Skin preparation:  Chloraprep Anesthesia (see MAR for exact dosages):    Anesthesia method:  Local infiltration   Local anesthetic:  Lidocaine 2% WITH epi Procedure type:    Complexity:  Simple Procedure details:    Incision types:  Elliptical   Incision depth:  Dermal   Scalpel blade:  11   Wound management:  Probed and deloculated and irrigated with saline   Drainage:  Serosanguinous   Drainage amount:  Copious   Packing materials:  1/4 in iodoform gauze Post-procedure details:    Patient tolerance of procedure:  Tolerated well, no immediate complications   (including critical care time)  Medications Ordered in ED Medications  piperacillin-tazobactam (ZOSYN) IVPB 3.375 g (0 g Intravenous Stopped 01/31/20 2028)  lidocaine-EPINEPHrine (XYLOCAINE W/EPI) 1 %-1:100000 (with pres) injection 20 mL (20 mLs Infiltration Given 01/31/20 1949)  HYDROcodone-acetaminophen (NORCO/VICODIN) 5-325 MG per tablet 1 tablet (1 tablet Oral Given 01/31/20 2112)    ED  Course  I have reviewed the triage vital signs and the nursing notes.  Pertinent labs & imaging results that were available during my care of the patient were reviewed by me and considered in my medical decision making (see chart for details).    MDM Rules/Calculators/A&P                          History provided by patient with additional history obtained from chart review.    34 year old type I diabetic male presenting with abscess and cellulitis on his right wrist.  He is afebrile, well-appearing, no acute distress.  Nontoxic in appearance.  He is hemodynamically stable.  He was noted to be tachycardic in triage however during exam heart rate is in the 80s.  He has a large area of induration with palpable fluctuance.  Radial pulses 2+ bilaterally. Labs were collected in triage which show leukocytosis of 12.1, hemoglobin consistent with baseline.  BMP with glucose of 283, otherwise unremarkable. Labs do not indicate DKA. Offered IV fluids for hyperglycemia, he would rather take his own insulin. IV zosyn given. Patient seen by ED attending Dr. Denton Lank who agrees with plan to I&D. He tolerated well, no immediate complications. Wil discharge with prescriptions for keflex and doxycycline.  Recommend wound recheck in 2 days to have packing removed. Encouraged home warm soaks and flushing. Lengthy discussion had about return precautions.  The patient appears reasonably screened and/or stabilized for discharge and I doubt any other medical condition or other [facility] requiring further screening, evaluation, or treatment in the ED at this time prior to discharge. The patient is safe for discharge with strict return precautions discussed.   Portions of this note were generated with  Scientist, clinical (histocompatibility and immunogenetics). Dictation errors may occur despite best attempts at proofreading.   Final Clinical Impression(s) / ED Diagnoses Final diagnoses:  Abscess  Cellulitis of right upper extremity    Rx / DC Orders ED  Discharge Orders         Ordered    doxycycline (VIBRAMYCIN) 100 MG capsule  2 times daily        01/31/20 2059    cephALEXin (KEFLEX) 500 MG capsule  4 times daily        01/31/20 2059           Kandice Hams 01/31/20 2113    Cathren Laine, MD 01/31/20 2310

## 2020-01-31 NOTE — ED Notes (Signed)
Patient verbalizes understanding of discharge instructions. Prescriptions reviewed. Opportunity for questioning and answers were provided. Armband removed by staff, pt discharged from ED ambulatory. ° °

## 2020-01-31 NOTE — ED Triage Notes (Signed)
Pt endorses right thumb pain and tenderness. Infection has spread to his right wrist area. Area is red and swollen. Pt is diabetic and on insulin.

## 2020-02-03 ENCOUNTER — Other Ambulatory Visit: Payer: Self-pay

## 2020-02-03 ENCOUNTER — Encounter (HOSPITAL_COMMUNITY): Payer: Self-pay | Admitting: Emergency Medicine

## 2020-02-03 ENCOUNTER — Emergency Department (HOSPITAL_COMMUNITY)
Admission: EM | Admit: 2020-02-03 | Discharge: 2020-02-03 | Disposition: A | Discharge status: Home / Self Care | Payer: Self-pay | Attending: Emergency Medicine | Admitting: Emergency Medicine

## 2020-02-03 DIAGNOSIS — Z5189 Encounter for other specified aftercare: Secondary | ICD-10-CM

## 2020-02-03 DIAGNOSIS — E10649 Type 1 diabetes mellitus with hypoglycemia without coma: Secondary | ICD-10-CM | POA: Insufficient documentation

## 2020-02-03 DIAGNOSIS — F17220 Nicotine dependence, chewing tobacco, uncomplicated: Secondary | ICD-10-CM | POA: Insufficient documentation

## 2020-02-03 DIAGNOSIS — Z7984 Long term (current) use of oral hypoglycemic drugs: Secondary | ICD-10-CM | POA: Insufficient documentation

## 2020-02-03 DIAGNOSIS — E1010 Type 1 diabetes mellitus with ketoacidosis without coma: Secondary | ICD-10-CM | POA: Insufficient documentation

## 2020-02-03 DIAGNOSIS — L02413 Cutaneous abscess of right upper limb: Secondary | ICD-10-CM | POA: Insufficient documentation

## 2020-02-03 LAB — AEROBIC CULTURE W GRAM STAIN (SUPERFICIAL SPECIMEN): Gram Stain: NONE SEEN

## 2020-02-03 NOTE — ED Triage Notes (Signed)
Pt states he was seen in ED on Thursday for abscess on R wrist and was told to return yesterday.  Denies fever/chills.  Bandage in place.

## 2020-02-03 NOTE — ED Provider Notes (Signed)
[facility] EMERGENCY DEPARTMENT Provider Note   CSN: [id number] Arrival date & time: 02/03/20  1314     History Chief Complaint  Patient presents with  . Abscess    Raymond [name] is a 34 y.o. male with history of diabetes on insulin, amphetamine use return to the ED for wound check.  Patient was seen in the ED 3 days ago for abscess on the right wrist.  He had incision and drainage and packing and was discharged with Keflex and doxycycline.  Patient states he has been taken his antibiotics.  He was supposed to come to the ED yesterday but could not.  Has been washing the skin with water and soap and keeping it covered with a dressing.  The packing fell out yesterday.  Reports improvement in swelling and pain.  Has continued redness and drainage coming out through the opening that was made in the ED for incision and drainage.  Denies fevers.  Reports initially his forearm and elbow area were hurting but this is also improved.  Admits to using amphetamine use frequently, last used 2 days ago.  Denies IV drug use of any sort in the past.  HPI     Past Medical History:  Diagnosis Date  . Borderline personality disorder (HCC)   . Diabetes mellitus without complication (HCC)    Type 1  . Gallstones     Patient Active Problem List   Diagnosis Date Noted  . Hypoglycemia due to type 1 diabetes mellitus (HCC) 10/06/2018  . DKA (diabetic ketoacidoses) 10/04/2018  . 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 Episodic substance abuse 07/23/2014  . Severe recurrent major depression without psychotic features (HCC) 07/22/2014  . Suicide attempt [facility])     Past Surgical History:  Procedure Laterality Date  . CHOLECYSTECTOMY         No family history on file.  Social History   Tobacco Use  . Smoking status: Never Smoker  . Smokeless tobacco: Current User    Types: Chew  Vaping Use  . Vaping Use: Never used    Substance Use Topics  . Alcohol use: Yes    Alcohol/week: 1.0 - 2.0 standard drink    Types: 1 - 2 Cans of beer per week    Comment: few times a week  . Drug use: Yes    Types: Other-see comments    Comment: OTC cough medicine    Home Medications Prior to Admission medications   Medication Sig Start Date End Date Taking? Authorizing Provider  atorvastatin (LIPITOR) 10 MG tablet Take 10 mg by mouth at bedtime. 11/30/19   [provider]  cephALEXin (KEFLEX) 500 MG capsule Take 1 capsule (500 mg total) by mouth 4 (four) times daily for 7 days. 02/01/20 02/08/20  Walisiewicz, Caroleen Hamman, PA-C  doxycycline (VIBRAMYCIN) 100 MG capsule Take 1 capsule (100 mg total) by mouth 2 (two) times daily for 7 days. 02/01/20 02/08/20  Walisiewicz, Yvonna Alanis E, PA-C  lisinopril (ZESTRIL) 10 MG tablet Take 10 mg by mouth daily. 11/30/19   [provider]  Multiple Vitamins-Minerals (ADVANCED DIABETIC MULTIVITAMIN PO) Take 1 tablet by mouth daily.    [provider]  NOVOLIN 70/30 RELION (70-30) 100 UNIT/ML injection Inject 35 Units into the skin 2 (two) times a day. 07/06/18   [provider]    Allergies    Ibuprofen and Tramadol  Review of Systems   Review of Systems  Skin: Positive for  color change.       abscess  All other systems reviewed and are negative.   Physical Exam Updated Vital Signs BP (!) 130/92 (BP Location: Left Arm)   Pulse (!) 102   Temp 98.4 F (36.9 C) (Oral)   Resp 16   SpO2 99%   Physical Exam Constitutional:      Appearance: He is well-developed.  HENT:     Head: Normocephalic.     Nose: Nose normal.  Eyes:     General: Lids are normal.  Cardiovascular:     Rate and Rhythm: Normal rate.  Pulmonary:     Effort: Pulmonary effort is normal. No respiratory distress.  Musculoskeletal:        General: Normal range of motion.     Cervical back: Normal range of motion.  Skin:    Comments: Right wrist: Dorsal wrist has area of erythema,  warmth.  No significant fluctuance.  There are 2 small incisions noted, with some firm pressure purulent drainage and blood were expressed.  Patient tolerated well without significant pain.  Full range of motion of the wrist without pain.  No circumferential erythema.  Compartments of the are soft.  No tenderness of the proximal distal joints and full range of motion.  Neurological:     Mental Status: He is alert.  Psychiatric:        Behavior: Behavior normal.            ED Results / Procedures / Treatments   Labs (all labs ordered are listed, but only abnormal results are displayed) Labs Reviewed - No data to display  EKG None  Radiology No results found.  Procedures Procedures (including critical care time)  Medications Ordered in ED Medications - No data to display  ED Course  I have reviewed the triage vital signs and the nursing notes.  Pertinent labs & imaging results that were available during my care of the patient were reviewed by me and considered in my medical decision making (see chart for details).    MDM Rules/Calculators/A&P                          EMR, triage nursing notes reviewed to assist with history and MDM  ED visit on 11/4 reviewed including the photo.  Abscess/wound with continued erythema and purulent drainage on my exam however appears better than 3 days ago.  Patient also reports pain, swelling has improved significantly.  No fever.  He has not been doing warm compresses at home. Overall clinical improvement but slow likely due to underlying diabetes.  Diabetes is poorly controlled.    Given clinical improvement and patient report, do not think there is need for further emergent imaging or lab work.  No systemic symptoms.  No pain with range of motion of the proximal or distal joints.  Doubt deep infection, septic arthritis, sepsis.  We will discharge patient with recommendation to do warm compresses/milking of drainage as frequently as  possible under warm water.  Encouraged high-dose Tylenol as well for inflammation.  Recommended return to the ED or urgent care for wound check in 48 hours given his underlying diabetes.  Return precautions for earlier discussed as well.  Patient is comfortable with this plan. Final Clinical Impression(s) / ED Diagnoses Final diagnoses:  Wound check, abscess    Rx / DC Orders ED Discharge Orders    None       Liberty Handy, PA-C 02/03/20  1449    Melene Plan, DO 02/03/20 1453

## 2020-02-03 NOTE — Discharge Instructions (Signed)
Abscess appears to be healing but slower than usual  Take 500 mg of acetaminophen every 6 hours which will help with remaining inflammation and healing  Massage any pus/blood out of the wound under hot/warm water at least every 6 hours during the day.  Continue taking your antibiotics  Return to the ED or urgent care for wound recheck in the next 48 hours to ensure that your abscess is healing appropriately and there is no complications.  Return sooner if you develop worsening redness warmth pain fevers

## 2020-02-05 ENCOUNTER — Encounter (HOSPITAL_COMMUNITY): Payer: Self-pay

## 2020-02-05 ENCOUNTER — Other Ambulatory Visit: Payer: Self-pay

## 2020-02-05 ENCOUNTER — Emergency Department (HOSPITAL_COMMUNITY)
Admission: EM | Admit: 2020-02-05 | Discharge: 2020-02-05 | Disposition: A | Discharge status: Home / Self Care | Payer: Self-pay | Attending: Emergency Medicine | Admitting: Emergency Medicine

## 2020-02-05 DIAGNOSIS — L02413 Cutaneous abscess of right upper limb: Secondary | ICD-10-CM | POA: Insufficient documentation

## 2020-02-05 DIAGNOSIS — Z4800 Encounter for change or removal of nonsurgical wound dressing: Secondary | ICD-10-CM | POA: Insufficient documentation

## 2020-02-05 DIAGNOSIS — R739 Hyperglycemia, unspecified: Secondary | ICD-10-CM

## 2020-02-05 DIAGNOSIS — E1065 Type 1 diabetes mellitus with hyperglycemia: Secondary | ICD-10-CM | POA: Insufficient documentation

## 2020-02-05 DIAGNOSIS — F17220 Nicotine dependence, chewing tobacco, uncomplicated: Secondary | ICD-10-CM | POA: Insufficient documentation

## 2020-02-05 DIAGNOSIS — Z5189 Encounter for other specified aftercare: Secondary | ICD-10-CM

## 2020-02-05 DIAGNOSIS — Z794 Long term (current) use of insulin: Secondary | ICD-10-CM | POA: Insufficient documentation

## 2020-02-05 LAB — CBG MONITORING, ED: Glucose-Capillary: 432 mg/dL — ABNORMAL HIGH (ref 70–99)

## 2020-02-05 NOTE — ED Provider Notes (Signed)
[facility] EMERGENCY DEPARTMENT Provider Note   CSN: [id number] Arrival date & time: 02/05/20  1945     History Chief Complaint  Patient presents with  . Wound Check    Raymond [name] is a 34 y.o. male.  HPI He is here for scheduled recheck of abscess of the right arm.  He feels like it is doing better.  He is taking the prescribed cephalexin and doxycycline.  He is using soaking methods to improve the drainage and discomfort.  He does not take his blood sugar at home.  He admits to not being compliant with his diabetic diet, but is taking his insulin 70/30, twice a day as prescribed.  He has not had any nausea, vomiting, fever, chills, weakness or dizziness.  He is here with his partner who is supportive.  There are no other known modifying factors.    Past Medical History:  Diagnosis Date  . Borderline personality disorder (HCC)   . Diabetes mellitus without complication (HCC)    Type 1  . Gallstones     Patient Active Problem List   Diagnosis Date Noted  . Hypoglycemia due to type 1 diabetes mellitus (HCC) 10/06/2018  . DKA (diabetic ketoacidoses) 10/04/2018  . 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 Episodic substance abuse 07/23/2014  . Severe recurrent major depression without psychotic features (HCC) 07/22/2014  . Suicide attempt [facility])     Past Surgical History:  Procedure Laterality Date  . CHOLECYSTECTOMY         History reviewed. No pertinent family history.  Social History   Tobacco Use  . Smoking status: Never Smoker  . Smokeless tobacco: Current User    Types: Chew  Vaping Use  . Vaping Use: Never used  Substance Use Topics  . Alcohol use: Yes    Alcohol/week: 1.0 - 2.0 standard drink    Types: 1 - 2 Cans of beer per week    Comment: few times a week  . Drug use: Yes    Types: Other-see comments    Comment: OTC cough medicine    Home Medications Prior to Admission  medications   Medication Sig Start Date End Date Taking? Authorizing Provider  atorvastatin (LIPITOR) 10 MG tablet Take 10 mg by mouth at bedtime. 11/30/19   [provider]  cephALEXin (KEFLEX) 500 MG capsule Take 1 capsule (500 mg total) by mouth 4 (four) times daily for 7 days. 02/01/20 02/08/20  Walisiewicz, Caroleen Hamman, PA-C  doxycycline (VIBRAMYCIN) 100 MG capsule Take 1 capsule (100 mg total) by mouth 2 (two) times daily for 7 days. 02/01/20 02/08/20  Walisiewicz, Yvonna Alanis E, PA-C  lisinopril (ZESTRIL) 10 MG tablet Take 10 mg by mouth daily. 11/30/19   [provider]  Multiple Vitamins-Minerals (ADVANCED DIABETIC MULTIVITAMIN PO) Take 1 tablet by mouth daily.    [provider]  NOVOLIN 70/30 RELION (70-30) 100 UNIT/ML injection Inject 35 Units into the skin 2 (two) times a day. 07/06/18   [provider]    Allergies    Ibuprofen and Tramadol  Review of Systems   Review of Systems  All other systems reviewed and are negative.   Physical Exam Updated Vital Signs BP (!) 142/83   Pulse 92   Temp 97.8 F (36.6 C) (Oral)   Resp 18   SpO2 100%   Physical Exam Vitals and nursing note reviewed.  Constitutional:      General: He is not in acute  distress.    Appearance: He is well-developed. He is not ill-appearing, toxic-appearing or diaphoretic.  HENT:     Head: Normocephalic and atraumatic.     Right Ear: External ear normal.     Left Ear: External ear normal.  Eyes:     Conjunctiva/sclera: Conjunctivae normal.     Pupils: Pupils are equal, round, and reactive to light.  Neck:     Trachea: Phonation normal.  Cardiovascular:     Rate and Rhythm: Normal rate.  Pulmonary:     Effort: Pulmonary effort is normal.  Abdominal:     General: There is no distension.  Musculoskeletal:        General: Normal range of motion.     Cervical back: Normal range of motion and neck supple.  Skin:    General: Skin is warm and dry.     Comments: Apparently  resolving abscess, right dorsal wrist region.  See representative photo below and compare with photo from 2 days ago.  No current fluctuance, or active drainage.  Mild erythema present.  Neurological:     Mental Status: He is alert and oriented to person, place, and time.     Cranial Nerves: No cranial nerve deficit.     Sensory: No sensory deficit.     Motor: No abnormal muscle tone.     Coordination: Coordination normal.  Psychiatric:        Mood and Affect: Mood normal.        Behavior: Behavior normal.        Thought Content: Thought content normal.        Judgment: Judgment normal.       ED Results / Procedures / Treatments   Labs (all labs ordered are listed, but only abnormal results are displayed) Labs Reviewed  CBG MONITORING, ED - Abnormal; Notable for the following components:      Result Value   Glucose-Capillary 432 (*)    All other components within normal limits    EKG None  Radiology No results found.  Procedures Procedures (including critical care time)  Medications Ordered in ED Medications - No data to display  ED Course  I have reviewed the triage vital signs and the nursing notes.  Pertinent labs & imaging results that were available during my care of the patient were reviewed by me and considered in my medical decision making (see chart for details).    MDM Rules/Calculators/A&P                           Patient Vitals for the past 24 hrs:  BP Temp Temp src Pulse Resp SpO2  02/05/20 2217 (!) 142/83 97.8 F (36.6 C) Oral 92 18 100 %  02/05/20 1953 127/74 97.7 F (36.5 C) Oral 98 16 98 %    10:04 PM Reevaluation with update and discussion. After initial assessment and treatment, an updated evaluation reveals no change in clinical status, findings discussed with the patient, all questions answered. Mancel Bale   Medical Decision Making:  This patient is presenting for evaluation of wound status post I&D, which does require a range of  treatment options, and is a complaint that involves a moderate risk of morbidity and mortality. The differential diagnoses include cellulitis, persistent abscess, systemic illness. I decided to review old records, and in summary patient treated with I&D about a week ago, presenting now as instructed for recheck.  I did not require additional historical information from  anyone.   Critical Interventions-clinical evaluation, observation and reassessment  After These Interventions, the Patient was reevaluated and was found stable for discharge.  Healing abscess not currently draining or fluctuant.  No evidence for deep tissue infection or sepsis.  Incidental hyperglycemia with medication noncompliance and not following diabetic diet.  No evidence for DKA clinically.  Stable for discharge.   CRITICAL CARE-no Performed by: Mancel Bale  Nursing Notes Reviewed/ Care Coordinated Applicable Imaging Reviewed Interpretation of Laboratory Data incorporated into ED treatment  The patient appears reasonably screened and/or stabilized for discharge and I doubt any other medical condition or other [facility] requiring further screening, evaluation, or treatment in the ED at this time prior to discharge.  Plan: Home Medications-continue usual as prescribed; Home Treatments-low-carb diet; return here if the recommended treatment, does not improve the symptoms; Recommended follow up-PCP, for blood sugar check in a week     Final Clinical Impression(s) / ED Diagnoses Final diagnoses:  Visit for wound check  Hyperglycemia    Rx / DC Orders ED Discharge Orders    None       Mancel Bale, MD 02/05/20 2307

## 2020-02-05 NOTE — ED Triage Notes (Signed)
Pt presents today for re-check of abscess on right arm.

## 2020-02-05 NOTE — Discharge Instructions (Signed)
Continue to treat the wound of the right wrist with a warm compress 3 times a day, and take the antibiotics until gone. See your doctor for a check up next week as scheduled.  Take your insulin tonight as scheduled. Try to watch your carbohydrates and drink 2 liters of water every day.

## 2020-03-09 ENCOUNTER — Encounter (HOSPITAL_COMMUNITY): Payer: Self-pay | Admitting: Emergency Medicine

## 2020-03-09 ENCOUNTER — Emergency Department (HOSPITAL_COMMUNITY)
Admission: EM | Admit: 2020-03-09 | Discharge: 2020-03-09 | Disposition: A | Discharge status: Home / Self Care | Payer: Self-pay | Attending: Emergency Medicine | Admitting: Emergency Medicine

## 2020-03-09 ENCOUNTER — Other Ambulatory Visit: Payer: Self-pay

## 2020-03-09 DIAGNOSIS — J340 Abscess, furuncle and carbuncle of nose: Secondary | ICD-10-CM | POA: Insufficient documentation

## 2020-03-09 DIAGNOSIS — B9689 Other specified bacterial agents as the cause of diseases classified elsewhere: Secondary | ICD-10-CM | POA: Insufficient documentation

## 2020-03-09 DIAGNOSIS — F17220 Nicotine dependence, chewing tobacco, uncomplicated: Secondary | ICD-10-CM | POA: Insufficient documentation

## 2020-03-09 DIAGNOSIS — L0291 Cutaneous abscess, unspecified: Secondary | ICD-10-CM

## 2020-03-09 DIAGNOSIS — E10649 Type 1 diabetes mellitus with hypoglycemia without coma: Secondary | ICD-10-CM | POA: Insufficient documentation

## 2020-03-09 DIAGNOSIS — L02411 Cutaneous abscess of right axilla: Secondary | ICD-10-CM | POA: Insufficient documentation

## 2020-03-09 DIAGNOSIS — Z79899 Other long term (current) drug therapy: Secondary | ICD-10-CM | POA: Insufficient documentation

## 2020-03-09 MED ORDER — LIDOCAINE HCL (PF) 1 % IJ SOLN
5.0000 mL | Freq: Once | INTRAMUSCULAR | Status: AC
  Administered 2020-03-09: 5 mL
  Filled 2020-03-09: qty 5

## 2020-03-09 MED ORDER — CLINDAMYCIN HCL 150 MG PO CAPS: 450.0000 mg | ORAL_CAPSULE | Freq: Three times a day (TID) | ORAL | 0 refills | Status: AC

## 2020-03-09 NOTE — Discharge Instructions (Signed)
Please pick up antibiotics and take as prescribed While at home please apply warm compresses to your abscesses and massage the areas to allow for natural expression of the pus Follow up with your PCP regarding your ED visit. If you cannot see them this upcoming week please return to the ED in 48 hours for recheck  I have also attached a list of resources regarding your methamphetamine use per your request

## 2020-03-09 NOTE — ED Triage Notes (Signed)
Patient reports chronic multiple skin abscesses at right axilla/groin with drainage , no fever or chills , Metampetamine use yesterday .

## 2020-03-09 NOTE — ED Provider Notes (Signed)
[facility] EMERGENCY DEPARTMENT Provider Note   CSN: [id number] Arrival date & time: 03/09/20  2015     History Chief Complaint  Patient presents with  . Skin Abscess    Axilla/Groin     Raymond [name] is a 34 y.o. male with PMHx Type 1 IDDM who presetns to the ED with complaint of multiple skin abscesses. Pt reports he noticed a small area of irritation to his left naris 2 days ago. He states he tried to use a small qtip to clean the area and since then he has had swelling and pain to the inside of his nose along his septum. He took his septum piercing out last week after it was too cold outside.   He also complains of areas of swelling, pain, redness to his right axilla and left groin area.  He states that he was seen about a month ago for an abscess to his wrist and this feels similar.  Patient does endorse that he smokes methamphetamine and sometimes snorts it.  Patient is interested in seeking help for his substance abuse.  Last use yesterday.  Denies fevers or chills.  The history is provided by the patient and medical records.       Past Medical History:  Diagnosis Date  . Borderline personality disorder (HCC)   . Diabetes mellitus without complication (HCC)    Type 1  . Gallstones     Patient Active Problem List   Diagnosis Date Noted  . Hypoglycemia due to type 1 diabetes mellitus (HCC) 10/06/2018  . DKA (diabetic ketoacidoses) 10/04/2018  . 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 Episodic substance abuse 07/23/2014  . Severe recurrent major depression without psychotic features (HCC) 07/22/2014  . Suicide attempt [facility])     Past Surgical History:  Procedure Laterality Date  . CHOLECYSTECTOMY         No family history on file.  Social History   Tobacco Use  . Smoking status: Never Smoker  . Smokeless tobacco: Current User    Types: Chew  Vaping Use  . Vaping Use: Never used   Substance Use Topics  . Alcohol use: Yes  . Drug use: Yes    Types: Other-see comments, Methamphetamines    Comment: OTC cough medicine    Home Medications Prior to Admission medications   Medication Sig Start Date End Date Taking? Authorizing Provider  atorvastatin (LIPITOR) 10 MG tablet Take 10 mg by mouth at bedtime. 11/30/19   [provider]  clindamycin (CLEOCIN) 150 MG capsule Take 3 capsules (450 mg total) by mouth 3 (three) times daily for 7 days. 03/09/20 03/16/20  Hyman Hopes, Doreatha Offer, PA-C  lisinopril (ZESTRIL) 10 MG tablet Take 10 mg by mouth daily. 11/30/19   [provider]  Multiple Vitamins-Minerals (ADVANCED DIABETIC MULTIVITAMIN PO) Take 1 tablet by mouth daily.    [provider]  NOVOLIN 70/30 RELION (70-30) 100 UNIT/ML injection Inject 35 Units into the skin 2 (two) times a day. 07/06/18   [provider]    Allergies    Ibuprofen and Tramadol  Review of Systems   Review of Systems  Constitutional: Negative for chills and fever.  Skin:       + abscesses    Physical Exam Updated Vital Signs BP (!) 167/102   Pulse 92   Temp 98.1 F (36.7 C) (Oral)   Resp 16   Ht 5\' 10"  (1.778 m)   Wt 85 kg  SpO2 100%   BMI 26.89 kg/m   Physical Exam Vitals and nursing note reviewed.  Constitutional:      Appearance: He is not ill-appearing.  HENT:     Head: Normocephalic and atraumatic.     Nose:     Comments: Swelling and erythema noted to the bulb of the nose as well as the left naris along the septum itself; no drainage appreciated Eyes:     Conjunctiva/sclera: Conjunctivae normal.  Cardiovascular:     Rate and Rhythm: Normal rate and regular rhythm.  Pulmonary:     Effort: Pulmonary effort is normal.     Breath sounds: Normal breath sounds.  Skin:    General: Skin is warm and dry.     Coloration: Skin is not jaundiced.     Comments: + multiple small indurated abscesses noted to right axilla with one measuring 1 x 1 cm  (largest) with small area of fluctuance  1 small indurated abscess to left groin without active drainage  Neurological:     Mental Status: He is alert.     ED Results / Procedures / Treatments   Labs (all labs ordered are listed, but only abnormal results are displayed) Labs Reviewed - No data to display  EKG None  Radiology No results found.  Procedures .Marland KitchenIncision and Drainage  Date/Time: 03/09/2020 9:21 PM Performed by: Tanda Rockers, PA-C Authorized by: Tanda Rockers, PA-C   Consent:    Consent obtained:  Verbal   Risks discussed:  Bleeding, incomplete drainage, infection and pain Location:    Type:  Abscess   Location:  Head   Head location:  Septum Anesthesia:    Anesthesia method:  Local infiltration   Local anesthetic:  Lidocaine 1% w/o epi Procedure type:    Complexity:  Simple Procedure details:    Incision types:  Stab incision   Drainage:  Purulent   Drainage amount:  Scant   Wound treatment:  Wound left open   Packing materials:  None Post-procedure details:    Procedure completion:  Tolerated well, no immediate complications .Marland KitchenIncision and Drainage  Date/Time: 03/09/2020 9:22 PM Performed by: Tanda Rockers, PA-C Authorized by: Tanda Rockers, PA-C   Consent:    Consent obtained:  Verbal   Consent given by:  Patient   Risks discussed:  Bleeding, incomplete drainage, infection and pain Location:    Type:  Abscess   Size:  1 x 1 cm   Location:  Upper extremity   Upper extremity location: right axilla. Pre-procedure details:    Skin preparation:  Povidone-iodine Anesthesia:    Anesthesia method:  Local infiltration   Local anesthetic:  Lidocaine 1% w/o epi Procedure type:    Complexity:  Simple Procedure details:    Incision types:  Single straight   Incision depth:  Dermal   Drainage:  Bloody and purulent   Drainage amount:  Scant   Wound treatment:  Wound left open   Packing materials:  None Post-procedure details:     Procedure completion:  Tolerated well, no immediate complications   (including critical care time)  Medications Ordered in ED Medications  lidocaine (PF) (XYLOCAINE) 1 % injection 5 mL (has no administration in time range)    ED Course  I have reviewed the triage vital signs and the nursing notes.  Pertinent labs & imaging results that were available during my care of the patient were reviewed by me and considered in my medical decision making (see chart for details).    MDM Rules/Calculators/A&P  34 year old male with a history of type 1 diabetes on insulin and history of since abuse with smoking/inhaling methamphetamine who presents to the ED with multiple abscesses.  Reports abscess to the septum of his left nare, right axilla, left groin.  Reports he was seen about 1 month ago for a cyst to his right wrist and this feels similar.  Patient denies any IV drug use.  No fevers or chills.  On arrival to the ED vitals are stable.  Patient is afebrile, nontachycardic and nontachypneic.  On exam patient has erythema and edema noted to the ball of his nose as well as into the left septum.  Will attempt needle aspiration to see if there is any pus that needs to be drained.  He has multiple abscesses in his right axilla however only one with fluctuance, will attempt I&D at this time.  He also has an indurated abscess to his left groin that is very small in nature and indurated.  Do not feel that he needs I&D of this at this time.  We will plan to discharge home on antibiotics and have him follow-up with his PCP for same.  While in the room patient does discussed that he is interested in quitting using methamphetamine.  We will give him resources at time of discharge.  I&D performed of his septum as well as his right axilla.  Needle aspiration to the septum with a small amount of purulent drainage.  I was able to perform a scalpel incision of his right axilla with a small amount  of purulent drainage as well.  Wound left open.  Will discharge home with antibiotics at this time and close PCP follow-up. Does appear he was on doxy last month; will provide clinda at this time. Pt in agreement with following up with PCP or returning to the ED in 48 hours for wound recheck. He is stable for discharge home.   This note was prepared using Dragon voice recognition software and may include unintentional dictation errors due to the inherent limitations of voice recognition software.  Final Clinical Impression(s) / ED Diagnoses Final diagnoses:  Cutaneous abscess, unspecified site    Rx / DC Orders ED Discharge Orders         Ordered    clindamycin (CLEOCIN) 150 MG capsule  3 times daily        03/09/20 2131           Discharge Instructions     Please pick up antibiotics and take as prescribed While at home please apply warm compresses to your abscesses and massage the areas to allow for natural expression of the pus Follow up with your PCP regarding your ED visit. If you cannot see them this upcoming week please return to the ED in 48 hours for recheck  I have also attached a list of resources regarding your methamphetamine use per your request       Tanda Rockers, Cordelia Poche 03/09/20 2133    Milagros Loll, MD 03/11/20 1615

## 2020-05-04 ENCOUNTER — Other Ambulatory Visit: Payer: Self-pay

## 2020-05-04 ENCOUNTER — Encounter (HOSPITAL_COMMUNITY): Payer: Self-pay

## 2020-05-04 ENCOUNTER — Emergency Department (HOSPITAL_COMMUNITY)
Admission: EM | Admit: 2020-05-04 | Discharge: 2020-05-04 | Disposition: A | Discharge status: Home / Self Care | Payer: 59 | Attending: Emergency Medicine | Admitting: Emergency Medicine

## 2020-05-04 DIAGNOSIS — Z5321 Procedure and treatment not carried out due to patient leaving prior to being seen by health care provider: Secondary | ICD-10-CM | POA: Diagnosis not present

## 2020-05-04 DIAGNOSIS — L0211 Cutaneous abscess of neck: Secondary | ICD-10-CM | POA: Diagnosis not present

## 2020-05-04 DIAGNOSIS — B9689 Other specified bacterial agents as the cause of diseases classified elsewhere: Secondary | ICD-10-CM | POA: Diagnosis not present

## 2020-05-04 LAB — BASIC METABOLIC PANEL
Anion gap: 15 (ref 5–15)
BUN: 21 mg/dL — ABNORMAL HIGH (ref 6–20)
CO2: 21 mmol/L — ABNORMAL LOW (ref 22–32)
Calcium: 9.2 mg/dL (ref 8.9–10.3)
Chloride: 97 mmol/L — ABNORMAL LOW (ref 98–111)
Creatinine, Ser: 1.27 mg/dL — ABNORMAL HIGH (ref 0.61–1.24)
GFR, Estimated: >60 mL/min (ref >60)
Glucose, Bld: 509 mg/dL (ref 70–99)
Potassium: 3.9 mmol/L (ref 3.5–5.1)
Sodium: 133 mmol/L — ABNORMAL LOW (ref 135–145)

## 2020-05-04 LAB — CBC
HCT: 40.5 % (ref 39.0–52.0)
Hemoglobin: 13.5 g/dL (ref 13.0–17.0)
MCH: 29 pg (ref 26.0–34.0)
MCHC: 33.3 g/dL (ref 30.0–36.0)
MCV: 86.9 fL (ref 80.0–100.0)
Platelets: 491 10*3/uL — ABNORMAL HIGH (ref 150–400)
RBC: 4.66 MIL/uL (ref 4.22–5.81)
RDW: 11.9 % (ref 11.5–15.5)
WBC: 10.7 10*3/uL — ABNORMAL HIGH (ref 4.0–10.5)
nRBC: 0 % (ref 0.0–0.2)

## 2020-05-04 LAB — CBG MONITORING, ED: Glucose-Capillary: 554 mg/dL (ref 70–99)

## 2020-05-04 NOTE — ED Triage Notes (Signed)
Patient with abscess to back of neck x 3 days, states hx of same. NAD

## 2020-05-04 NOTE — ED Notes (Signed)
Patient states he is upset about the wait time. If he cant be moved to a room immediately he is leaving. Explained acuity versus wait time and reiterated that he was a priority and that his sugar was elevated. States he is comfortable with the decision to leave and that he would just come back tomorrow

## 2020-05-07 ENCOUNTER — Encounter (HOSPITAL_COMMUNITY): Payer: Self-pay

## 2020-05-07 ENCOUNTER — Other Ambulatory Visit: Payer: Self-pay

## 2020-05-07 ENCOUNTER — Emergency Department (HOSPITAL_COMMUNITY)
Admission: EM | Admit: 2020-05-07 | Discharge: 2020-05-07 | Disposition: A | Discharge status: Home / Self Care | Payer: 59 | Attending: Emergency Medicine | Admitting: Emergency Medicine

## 2020-05-07 DIAGNOSIS — E1010 Type 1 diabetes mellitus with ketoacidosis without coma: Secondary | ICD-10-CM | POA: Diagnosis not present

## 2020-05-07 DIAGNOSIS — Z794 Long term (current) use of insulin: Secondary | ICD-10-CM | POA: Diagnosis not present

## 2020-05-07 DIAGNOSIS — L02811 Cutaneous abscess of head [any part, except face]: Secondary | ICD-10-CM | POA: Insufficient documentation

## 2020-05-07 DIAGNOSIS — Z8614 Personal history of Methicillin resistant Staphylococcus aureus infection: Secondary | ICD-10-CM

## 2020-05-07 DIAGNOSIS — E10649 Type 1 diabetes mellitus with hypoglycemia without coma: Secondary | ICD-10-CM | POA: Diagnosis not present

## 2020-05-07 DIAGNOSIS — L03811 Cellulitis of head [any part, except face]: Secondary | ICD-10-CM

## 2020-05-07 DIAGNOSIS — E109 Type 1 diabetes mellitus without complications: Secondary | ICD-10-CM | POA: Insufficient documentation

## 2020-05-07 DIAGNOSIS — R739 Hyperglycemia, unspecified: Secondary | ICD-10-CM

## 2020-05-07 LAB — CBC WITH DIFFERENTIAL/PLATELET
Abs Immature Granulocytes: 0.08 10*3/uL — ABNORMAL HIGH (ref 0.00–0.07)
Basophils Absolute: 0.1 10*3/uL (ref 0.0–0.1)
Basophils Relative: 0 %
Eosinophils Absolute: 0.2 10*3/uL (ref 0.0–0.5)
Eosinophils Relative: 1 %
HCT: 37.8 % — ABNORMAL LOW (ref 39.0–52.0)
Hemoglobin: 13.3 g/dL (ref 13.0–17.0)
Immature Granulocytes: 1 %
Lymphocytes Relative: 11 %
Lymphs Abs: 1.8 10*3/uL (ref 0.7–4.0)
MCH: 30.5 pg (ref 26.0–34.0)
MCHC: 35.2 g/dL (ref 30.0–36.0)
MCV: 86.7 fL (ref 80.0–100.0)
Monocytes Absolute: 0.7 10*3/uL (ref 0.1–1.0)
Monocytes Relative: 4 %
Neutro Abs: 13.9 10*3/uL — ABNORMAL HIGH (ref 1.7–7.7)
Neutrophils Relative %: 83 %
Platelets: 454 10*3/uL — ABNORMAL HIGH (ref 150–400)
RBC: 4.36 MIL/uL (ref 4.22–5.81)
RDW: 12.2 % (ref 11.5–15.5)
WBC: 16.8 10*3/uL — ABNORMAL HIGH (ref 4.0–10.5)
nRBC: 0 % (ref 0.0–0.2)

## 2020-05-07 LAB — URINALYSIS, ROUTINE W REFLEX MICROSCOPIC
Bacteria, UA: NONE SEEN
Bilirubin Urine: NEGATIVE
Glucose, UA: >=500 mg/dL — AB
Hgb urine dipstick: NEGATIVE
Ketones, ur: NEGATIVE mg/dL
Leukocytes,Ua: NEGATIVE
Nitrite: NEGATIVE
Protein, ur: 100 mg/dL — AB
Specific Gravity, Urine: 1.012 (ref 1.005–1.030)
pH: 7 (ref 5.0–8.0)

## 2020-05-07 LAB — BASIC METABOLIC PANEL
Anion gap: 11 (ref 5–15)
BUN: 15 mg/dL (ref 6–20)
CO2: 24 mmol/L (ref 22–32)
Calcium: 8.7 mg/dL — ABNORMAL LOW (ref 8.9–10.3)
Chloride: 96 mmol/L — ABNORMAL LOW (ref 98–111)
Creatinine, Ser: 1.02 mg/dL (ref 0.61–1.24)
GFR, Estimated: >60 mL/min (ref >60)
Glucose, Bld: 425 mg/dL — ABNORMAL HIGH (ref 70–99)
Potassium: 4.2 mmol/L (ref 3.5–5.1)
Sodium: 131 mmol/L — ABNORMAL LOW (ref 135–145)

## 2020-05-07 LAB — CBG MONITORING, ED: Glucose-Capillary: 405 mg/dL — ABNORMAL HIGH (ref 70–99)

## 2020-05-07 MED ORDER — CEPHALEXIN 250 MG PO CAPS
500.0000 mg | ORAL_CAPSULE | Freq: Once | ORAL | Status: AC
  Administered 2020-05-07: 500 mg via ORAL
  Filled 2020-05-07: qty 2

## 2020-05-07 MED ORDER — CEPHALEXIN 500 MG PO CAPS: 500.0000 mg | ORAL_CAPSULE | Freq: Four times a day (QID) | ORAL | 0 refills | Status: DC

## 2020-05-07 MED ORDER — DOXYCYCLINE HYCLATE 100 MG PO TABS
100.0000 mg | ORAL_TABLET | Freq: Once | ORAL | Status: AC
  Administered 2020-05-07: 100 mg via ORAL
  Filled 2020-05-07: qty 1

## 2020-05-07 MED ORDER — ACETAMINOPHEN 500 MG PO TABS
1000.0000 mg | ORAL_TABLET | Freq: Once | ORAL | Status: AC
  Administered 2020-05-07: 1000 mg via ORAL
  Filled 2020-05-07: qty 2

## 2020-05-07 MED ORDER — SODIUM CHLORIDE 0.9 % IV BOLUS
1000.0000 mL | Freq: Once | INTRAVENOUS | Status: AC
  Administered 2020-05-07: 1000 mL via INTRAVENOUS

## 2020-05-07 MED ORDER — DOXYCYCLINE HYCLATE 100 MG PO CAPS: 100.0000 mg | ORAL_CAPSULE | Freq: Two times a day (BID) | ORAL | 0 refills | Status: DC

## 2020-05-07 NOTE — Discharge Instructions (Addendum)
You are seen for an infection on the back of your scalp.  There is an area of cellulitis there and may be starting to form an abscess.  It does not appear that it needs to be drained currently.  We are putting you on antibiotics.  Please use warm compress.  Return to the emergency department if worsening symptoms or any fever.  Please check your sugars and take your medication for your diabetes.  Follow-up with your doctor.

## 2020-05-07 NOTE — ED Triage Notes (Signed)
Pt has abscess on the back of his head since Friday with no drainage.

## 2020-05-07 NOTE — ED Provider Notes (Signed)
[facility] EMERGENCY DEPARTMENT Provider Note   CSN: [id number] Arrival date & time: 05/07/20  0100     History Chief Complaint  Patient presents with  . Abscess    Raymond [name] is a 35 y.o. male.  He has a history of type 1 diabetes and MRSA.  History of abscesses.  Complaining of pain in the back of his head that is been going on for 5 days.  Said he did poke at it a little bit and it seemed to make it worse.  No fevers.  Does not check his blood sugar.  Pain is throbbing worse with any pressure.  Not on antibiotics currently.  The history is provided by the patient.  Abscess Location:  Head/neck Head/neck abscess location:  Scalp Size:  5 Abscess quality: induration, painful and redness   Abscess quality: not draining and no fluctuance   Duration:  5 days Progression:  Worsening Pain details:    Quality:  Throbbing   Severity:  Moderate   Timing:  Constant   Progression:  Unchanged Chronicity:  Recurrent Context: diabetes   Relieved by:  Nothing Worsened by:  Draining/squeezing Ineffective treatments:  Warm compresses Associated symptoms: headaches   Associated symptoms: no fever, no nausea and no vomiting   Risk factors: hx of MRSA and prior abscess        Past Medical History:  Diagnosis Date  . Borderline personality disorder (HCC)   . Diabetes mellitus without complication (HCC)    Type 1  . Gallstones     Patient Active Problem List   Diagnosis Date Noted  . Hypoglycemia due to type 1 diabetes mellitus (HCC) 10/06/2018  . DKA (diabetic ketoacidoses) 10/04/2018  . 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 Episodic substance abuse 07/23/2014  . Severe recurrent major depression without psychotic features (HCC) 07/22/2014  . Suicide attempt [facility])     Past Surgical History:  Procedure Laterality Date  . CHOLECYSTECTOMY         No family history on file.  Social History    Tobacco Use  . Smoking status: Never Smoker  . Smokeless tobacco: Current User    Types: Chew  Vaping Use  . Vaping Use: Never used  Substance Use Topics  . Alcohol use: Yes  . Drug use: Yes    Types: Other-see comments, Methamphetamines    Comment: OTC cough medicine    Home Medications Prior to Admission medications   Medication Sig Start Date End Date Taking? Authorizing Provider  atorvastatin (LIPITOR) 10 MG tablet Take 10 mg by mouth at bedtime. 11/30/19   [provider]  lisinopril (ZESTRIL) 10 MG tablet Take 10 mg by mouth daily. 11/30/19   [provider]  Multiple Vitamins-Minerals (ADVANCED DIABETIC MULTIVITAMIN PO) Take 1 tablet by mouth daily.    [provider]  NOVOLIN 70/30 RELION (70-30) 100 UNIT/ML injection Inject 35 Units into the skin 2 (two) times a day. 07/06/18   [provider]    Allergies    Ibuprofen and Tramadol  Review of Systems   Review of Systems  Constitutional: Negative for fever.  HENT: Negative for sore throat.   Eyes: Negative for visual disturbance.  Respiratory: Negative for shortness of breath.   Cardiovascular: Negative for chest pain.  Gastrointestinal: Negative for nausea and vomiting.  Genitourinary: Negative for dysuria.  Musculoskeletal: Positive for neck pain.  Skin: Positive for rash.  Neurological: Positive for headaches.  Physical Exam Updated Vital Signs BP (!) 167/107 (BP Location: Left Arm)   Pulse 89   Temp 98.7 F (37.1 C) (Oral)   Resp 16   SpO2 98%   Physical Exam Vitals and nursing note reviewed.  Constitutional:      Appearance: Normal appearance. He is well-developed and well-nourished.  HENT:     Head: Normocephalic and atraumatic.     Comments: Posterior head within the hairline has a 5 cm circumferential indurated red area with a few pustules.  No fluctuance.  Tender. Eyes:     Conjunctiva/sclera: Conjunctivae normal.  Cardiovascular:     Rate and Rhythm:  Normal rate and regular rhythm.     Heart sounds: No murmur heard.   Pulmonary:     Effort: Pulmonary effort is normal. No respiratory distress.     Breath sounds: Normal breath sounds.  Abdominal:     Palpations: Abdomen is soft.     Tenderness: There is no abdominal tenderness.  Musculoskeletal:        General: No edema.     Cervical back: Neck supple.  Skin:    General: Skin is warm and dry.  Neurological:     General: No focal deficit present.     Mental Status: He is alert.     GCS: GCS eye subscore is 4. GCS verbal subscore is 5. GCS motor subscore is 6.  Psychiatric:        Mood and Affect: Mood and affect normal.       ED Results / Procedures / Treatments   Labs (all labs ordered are listed, but only abnormal results are displayed) Labs Reviewed  BASIC METABOLIC PANEL - Abnormal; Notable for the following components:      Result Value   Sodium 131 (*)    Chloride 96 (*)    Glucose, Bld 425 (*)    Calcium 8.7 (*)    All other components within normal limits  CBC WITH DIFFERENTIAL/PLATELET - Abnormal; Notable for the following components:   WBC 16.8 (*)    HCT 37.8 (*)    Platelets 454 (*)    Neutro Abs 13.9 (*)    Abs Immature Granulocytes 0.08 (*)    All other components within normal limits  URINALYSIS, ROUTINE W REFLEX MICROSCOPIC - Abnormal; Notable for the following components:   Color, Urine COLORLESS (*)    Glucose, UA >=500 (*)    Protein, ur 100 (*)    All other components within normal limits  CBG MONITORING, ED - Abnormal; Notable for the following components:   Glucose-Capillary 405 (*)    All other components within normal limits    EKG None  Radiology No results found.  Procedures Procedures   Medications Ordered in ED Medications  cephALEXin (KEFLEX) capsule 500 mg (500 mg Oral Given 05/07/20 0838)  doxycycline (VIBRA-TABS) tablet 100 mg (100 mg Oral Given 05/07/20 0838)  sodium chloride 0.9 % bolus 1,000 mL (0 mLs Intravenous  Stopped 05/07/20 1127)  acetaminophen (TYLENOL) tablet 1,000 mg (1,000 mg Oral Given 05/07/20 7616)    ED Course  I have reviewed the triage vital signs and the nursing notes.  Pertinent labs & imaging results that were available during my care of the patient were reviewed by me and considered in my medical decision making (see chart for details).  Clinical Course as of 05/07/20 1722  Wed May 07, 2020  0737 Poorly controlled diabetic here with abscess to the back of his scalp.  Blood  sugar 405.  Will check CBC and chemistry and give IV fluids and make sure not in DKA.  Clinically does not appear so. [MB]  [id number] Chemistries with elevated glucose and low sodium pseudohyponatremia but normal gap.  Does have an elevated white count. [MB]    Clinical Course User Index [MB] Terrilee Files, MD   MDM Rules/Calculators/A&P                         This patient complains of scalp infection; this involves an extensive number of treatment Options and is a complaint that carries with it a high risk of complications and Morbidity. The differential includes cellulitis, abscess, carbuncle, hyperglycemia, DKA  I ordered, reviewed and interpreted labs, which included CBC with elevated white count, normal hemoglobin, chemistries with elevated blood glucose and low sodium pseudohyponatremia but normal gap, urinalysis with glucose but no ketones I ordered medication IV fluids oral antibiotics oral Tylenol Previous records obtained and reviewed patient has been seen prior ED visits for abscesses and is MRSA positive  After the interventions stated above, I reevaluated the patient and found currently no area of fluctuance to I&D. No evidence of DKA. Will cover with Keflex and doxycycline recommend close follow-up with PCP or return to the ED.   Final Clinical Impression(s) / ED Diagnoses Final diagnoses:  Abscess or cellulitis of scalp  History of MRSA infection  Hyperglycemia    Rx / DC Orders ED  Discharge Orders         Ordered    cephALEXin (KEFLEX) 500 MG capsule  4 times daily        05/07/20 1015    doxycycline (VIBRAMYCIN) 100 MG capsule  2 times daily        05/07/20 1015           Terrilee Files, MD 05/07/20 1724

## 2020-05-15 ENCOUNTER — Encounter (HOSPITAL_COMMUNITY): Payer: Self-pay | Admitting: Emergency Medicine

## 2020-05-15 ENCOUNTER — Emergency Department (HOSPITAL_COMMUNITY)
Admission: EM | Admit: 2020-05-15 | Discharge: 2020-05-16 | Disposition: A | Discharge status: Home / Self Care | Payer: 59 | Attending: Emergency Medicine | Admitting: Emergency Medicine

## 2020-05-15 ENCOUNTER — Other Ambulatory Visit: Payer: Self-pay

## 2020-05-15 DIAGNOSIS — E1010 Type 1 diabetes mellitus with ketoacidosis without coma: Secondary | ICD-10-CM | POA: Diagnosis not present

## 2020-05-15 DIAGNOSIS — L0211 Cutaneous abscess of neck: Secondary | ICD-10-CM | POA: Diagnosis present

## 2020-05-15 DIAGNOSIS — H1131 Conjunctival hemorrhage, right eye: Secondary | ICD-10-CM | POA: Diagnosis not present

## 2020-05-15 DIAGNOSIS — Z794 Long term (current) use of insulin: Secondary | ICD-10-CM | POA: Diagnosis not present

## 2020-05-15 DIAGNOSIS — I10 Essential (primary) hypertension: Secondary | ICD-10-CM | POA: Diagnosis not present

## 2020-05-15 DIAGNOSIS — Z79899 Other long term (current) drug therapy: Secondary | ICD-10-CM | POA: Insufficient documentation

## 2020-05-15 NOTE — ED Triage Notes (Signed)
Pt reports used meth approx 4 hours ago and walked here from the bus stop. Pt c/o ruptured blood vessel to right eye. Abcess to back of head, he has been taking doxycycline and keflex with some improvement.

## 2020-05-16 LAB — CBC WITH DIFFERENTIAL/PLATELET
Abs Immature Granulocytes: 0.02 10*3/uL (ref 0.00–0.07)
Basophils Absolute: 0.1 10*3/uL (ref 0.0–0.1)
Basophils Relative: 2 %
Eosinophils Absolute: 0.1 10*3/uL (ref 0.0–0.5)
Eosinophils Relative: 1 %
HCT: 35.2 % — ABNORMAL LOW (ref 39.0–52.0)
Hemoglobin: 11.6 g/dL — ABNORMAL LOW (ref 13.0–17.0)
Immature Granulocytes: 0 %
Lymphocytes Relative: 45 %
Lymphs Abs: 3 10*3/uL (ref 0.7–4.0)
MCH: 28.9 pg (ref 26.0–34.0)
MCHC: 33 g/dL (ref 30.0–36.0)
MCV: 87.6 fL (ref 80.0–100.0)
Monocytes Absolute: 0.6 10*3/uL (ref 0.1–1.0)
Monocytes Relative: 8 %
Neutro Abs: 3 10*3/uL (ref 1.7–7.7)
Neutrophils Relative %: 44 %
Platelets: 581 10*3/uL — ABNORMAL HIGH (ref 150–400)
RBC: 4.02 MIL/uL — ABNORMAL LOW (ref 4.22–5.81)
RDW: 11.8 % (ref 11.5–15.5)
WBC: 6.8 10*3/uL (ref 4.0–10.5)
nRBC: 0 % (ref 0.0–0.2)

## 2020-05-16 LAB — BASIC METABOLIC PANEL
Anion gap: 13 (ref 5–15)
BUN: 21 mg/dL — ABNORMAL HIGH (ref 6–20)
CO2: 19 mmol/L — ABNORMAL LOW (ref 22–32)
Calcium: 8.6 mg/dL — ABNORMAL LOW (ref 8.9–10.3)
Chloride: 95 mmol/L — ABNORMAL LOW (ref 98–111)
Creatinine, Ser: 1.37 mg/dL — ABNORMAL HIGH (ref 0.61–1.24)
GFR, Estimated: >60 mL/min (ref >60)
Glucose, Bld: 609 mg/dL (ref 70–99)
Potassium: 4.3 mmol/L (ref 3.5–5.1)
Sodium: 127 mmol/L — ABNORMAL LOW (ref 135–145)

## 2020-05-16 LAB — CBG MONITORING, ED
Glucose-Capillary: 420 mg/dL — ABNORMAL HIGH (ref 70–99)
Glucose-Capillary: 586 mg/dL (ref 70–99)

## 2020-05-16 MED ORDER — SODIUM CHLORIDE 0.9 % IV BOLUS
1000.0000 mL | Freq: Once | INTRAVENOUS | Status: AC
  Administered 2020-05-16: 1000 mL via INTRAVENOUS

## 2020-05-16 MED ORDER — INSULIN ASPART 100 UNIT/ML ~~LOC~~ SOLN
10.0000 [IU] | Freq: Once | SUBCUTANEOUS | Status: AC
  Administered 2020-05-16: 10 [IU] via SUBCUTANEOUS

## 2020-05-16 MED ORDER — LIDOCAINE HCL 2 % IJ SOLN
10.0000 mL | Freq: Once | INTRAMUSCULAR | Status: AC
  Administered 2020-05-16: 200 mg
  Filled 2020-05-16: qty 20

## 2020-05-16 MED ORDER — CLONIDINE HCL 0.2 MG PO TABS
0.2000 mg | ORAL_TABLET | Freq: Once | ORAL | Status: AC
  Administered 2020-05-16: 0.2 mg via ORAL
  Filled 2020-05-16: qty 1

## 2020-05-16 NOTE — ED Provider Notes (Signed)
[facility] EMERGENCY DEPARTMENT Provider Note   CSN: [id number] Arrival date & time: 05/15/20  2242     History Chief Complaint  Patient presents with  . Headache  . Abscess    Raymond [name] is a 35 y.o. male.  Patient presents to the emergency department with concern over a "ruptured blood vessel" in his right eye.  He noticed it earlier today.  He denies any pain or visual disturbance.  He has not had any forceful coughing, sneezing, vomiting or trauma to the eye.  Patient also complains of persistent abscess on the back of his neck.  He reports that he has been taking the antibiotics that were prescribed, has 2 more days.  He reports that he has been squeezing the area daily and has been getting some pus out.        Past Medical History:  Diagnosis Date  . Borderline personality disorder (HCC)   . Diabetes mellitus without complication (HCC)    Type 1  . Gallstones     Patient Active Problem List   Diagnosis Date Noted  . Hypoglycemia due to type 1 diabetes mellitus (HCC) 10/06/2018  . DKA (diabetic ketoacidoses) 10/04/2018  . 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 Episodic substance abuse 07/23/2014  . Severe recurrent major depression without psychotic features (HCC) 07/22/2014  . Suicide attempt [facility])     Past Surgical History:  Procedure Laterality Date  . CHOLECYSTECTOMY         History reviewed. No pertinent family history.  Social History   Tobacco Use  . Smoking status: Never Smoker  . Smokeless tobacco: Current User    Types: Chew  Vaping Use  . Vaping Use: Never used  Substance Use Topics  . Alcohol use: Yes  . Drug use: Yes    Types: Other-see comments, Methamphetamines    Comment: OTC cough medicine    Home Medications Prior to Admission medications   Medication Sig Start Date End Date Taking? Authorizing Provider  atorvastatin (LIPITOR) 10 MG tablet Take 10 mg  by mouth at bedtime. 11/30/19   [provider]  cephALEXin (KEFLEX) 500 MG capsule Take 1 capsule (500 mg total) by mouth 4 (four) times daily. 05/07/20   Terrilee Files, MD  doxycycline (VIBRAMYCIN) 100 MG capsule Take 1 capsule (100 mg total) by mouth 2 (two) times daily. 05/07/20   Terrilee Files, MD  lisinopril (ZESTRIL) 10 MG tablet Take 10 mg by mouth daily. 11/30/19   [provider]  Multiple Vitamins-Minerals (ADVANCED DIABETIC MULTIVITAMIN PO) Take 1 tablet by mouth daily.    [provider]  NOVOLIN 70/30 RELION (70-30) 100 UNIT/ML injection Inject 35 Units into the skin 2 (two) times a day. 07/06/18   [provider]    Allergies    Ibuprofen and Tramadol  Review of Systems   Review of Systems  Eyes: Positive for redness. Negative for visual disturbance.  Skin: Positive for wound.  All other systems reviewed and are negative.   Physical Exam Updated Vital Signs BP (!) 139/103 (BP Location: Right Arm)   Pulse 92   Temp 98 F (36.7 C) (Oral)   Resp 18   SpO2 99%   Physical Exam Vitals and nursing note reviewed.  Constitutional:      General: He is not in acute distress.    Appearance: Normal appearance. He is well-developed and well-nourished.  HENT:     Head: Normocephalic and  atraumatic.     Right Ear: Hearing normal.     Left Ear: Hearing normal.     Nose: Nose normal.     Mouth/Throat:     Mouth: Oropharynx is clear and moist and mucous membranes are normal.  Eyes:     Extraocular Movements: Extraocular movements intact and EOM normal.     Conjunctiva/sclera:     Right eye: Hemorrhage present.     Pupils: Pupils are equal, round, and reactive to light.  Cardiovascular:     Rate and Rhythm: Regular rhythm.     Heart sounds: S1 normal and S2 normal. No murmur heard. No friction rub. No gallop.   Pulmonary:     Effort: Pulmonary effort is normal. No respiratory distress.     Breath sounds: Normal breath sounds.  Chest:      Chest wall: No tenderness.  Abdominal:     General: Bowel sounds are normal.     Palpations: Abdomen is soft. There is no hepatosplenomegaly.     Tenderness: There is no abdominal tenderness. There is no guarding or rebound. Negative signs include Murphy's sign and McBurney's sign.     Hernia: No hernia is present.  Musculoskeletal:        General: Normal range of motion.     Cervical back: Normal range of motion and neck supple.  Skin:    General: Skin is warm, dry and intact.     Findings: No rash.     Nails: There is no cyanosis.     Comments: 3cm fluctuant mass posterior neck  Neurological:     Mental Status: He is alert and oriented to person, place, and time.     GCS: GCS eye subscore is 4. GCS verbal subscore is 5. GCS motor subscore is 6.     Cranial Nerves: No cranial nerve deficit.     Sensory: No sensory deficit.     Coordination: Coordination normal.     Deep Tendon Reflexes: Strength normal.  Psychiatric:        Mood and Affect: Mood and affect normal.        Speech: Speech normal.        Behavior: Behavior normal.        Thought Content: Thought content normal.     ED Results / Procedures / Treatments   Labs (all labs ordered are listed, but only abnormal results are displayed) Labs Reviewed  CBC WITH DIFFERENTIAL/PLATELET - Abnormal; Notable for the following components:      Result Value   RBC 4.02 (*)    Hemoglobin 11.6 (*)    HCT 35.2 (*)    Platelets 581 (*)    All other components within normal limits  BASIC METABOLIC PANEL - Abnormal; Notable for the following components:   Sodium 127 (*)    Chloride 95 (*)    CO2 19 (*)    Glucose, Bld 609 (*)    BUN 21 (*)    Creatinine, Ser 1.37 (*)    Calcium 8.6 (*)    All other components within normal limits  CBG MONITORING, ED - Abnormal; Notable for the following components:   Glucose-Capillary 586 (*)    All other components within normal limits  CBG MONITORING, ED - Abnormal; Notable for the  following components:   Glucose-Capillary 420 (*)    All other components within normal limits    EKG None  Radiology No results found.  Procedures .Marland Kitchen.Incision and Drainage  Date/Time: 05/16/2020 1:59 AM  Performed by: Gilda Crease, MD Authorized by: Gilda Crease, MD   Consent:    Consent obtained:  Verbal   Consent given by:  Patient   Risks, benefits, and alternatives were discussed: yes     Risks discussed:  Bleeding, incomplete drainage and pain Universal protocol:    Procedure explained and questions answered to patient or proxy's satisfaction: yes     Site/side marked: yes     Immediately prior to procedure, a time out was called: yes     Patient identity confirmed:  Verbally with patient Location:    Type:  Cyst   Size:  3cm   Location:  Neck   Neck location: central posterior. Pre-procedure details:    Skin preparation:  Povidone-iodine Sedation:    Sedation type:  None Anesthesia:    Anesthesia method:  Local infiltration   Local anesthetic:  Lidocaine 2% w/o epi Procedure type:    Complexity:  Simple Procedure details:    Ultrasound guidance: no     Needle aspiration: no     Incision types:  Single straight   Incision depth:  Dermal   Wound management:  Probed and deloculated   Drainage:  Serous   Drainage amount:  Copious   Wound treatment:  Wound left open   Packing materials:  None Post-procedure details:    Procedure completion:  Tolerated well, no immediate complications     Medications Ordered in ED Medications  lidocaine (XYLOCAINE) 2 % (with pres) injection 200 mg (200 mg Infiltration Given 05/16/20 0103)  cloNIDine (CATAPRES) tablet 0.2 mg (0.2 mg Oral Given 05/16/20 0104)  sodium chloride 0.9 % bolus 1,000 mL (0 mLs Intravenous Stopped 05/16/20 0213)  insulin aspart (novoLOG) injection 10 Units (10 Units Subcutaneous Given 05/16/20 0104)  sodium chloride 0.9 % bolus 1,000 mL (0 mLs Intravenous Stopped 05/16/20 0322)   insulin aspart (novoLOG) injection 10 Units (10 Units Subcutaneous Given 05/16/20 0226)    ED Course  I have reviewed the triage vital signs and the nursing notes.  Pertinent labs & imaging results that were available during my care of the patient were reviewed by me and considered in my medical decision making (see chart for details).    MDM Rules/Calculators/A&P                          Patient presented to the emergency department with primary complaint of bleeding in right eye.  Examination reveals a benign subconjunctival hemorrhage.  He also, however, complains of persistent abscess on the back of his neck.  The area was large and fluctuant, I recommended incision and drainage.  He did consent to this.  Incision and drainage was performed and a large amount of serous fluid drained from the cavity, suspect this was a noninfected cyst.  He can finish the current antibiotics.  He was given instructions on local wound care.  Patient also found to be hyperglycemic.  He reports that he has been taking his nighttime meds.  Patient given IV fluids and insulin to correct his hyperglycemia.  He was counseled that he needs to get better control of his sugars.  Patient's blood pressure also elevated.  This is felt to be secondary to using methamphetamine earlier today.  Counseled on the dangers of methamphetamine use.  Blood pressure better with clonidine.   Final Clinical Impression(s) / ED Diagnoses Final diagnoses:  Hypertension, unspecified type  Subconjunctival hemorrhage of right eye  Cutaneous abscess of  neck    Rx / DC Orders ED Discharge Orders    None       Dezire Turk, Canary Brim, MD 05/21/20 [phone]

## 2020-05-16 NOTE — ED Notes (Signed)
Pollina made aware of critical glucose results.

## 2020-07-02 ENCOUNTER — Encounter (HOSPITAL_COMMUNITY): Payer: Self-pay

## 2020-07-02 ENCOUNTER — Emergency Department (HOSPITAL_COMMUNITY)
Admission: EM | Admit: 2020-07-02 | Discharge: 2020-07-03 | Disposition: A | Discharge status: Home / Self Care | Payer: 59 | Attending: Emergency Medicine | Admitting: Emergency Medicine

## 2020-07-02 ENCOUNTER — Other Ambulatory Visit: Payer: Self-pay

## 2020-07-02 DIAGNOSIS — L03221 Cellulitis of neck: Secondary | ICD-10-CM | POA: Insufficient documentation

## 2020-07-02 DIAGNOSIS — E10649 Type 1 diabetes mellitus with hypoglycemia without coma: Secondary | ICD-10-CM | POA: Insufficient documentation

## 2020-07-02 DIAGNOSIS — L0211 Cutaneous abscess of neck: Secondary | ICD-10-CM | POA: Insufficient documentation

## 2020-07-02 DIAGNOSIS — E1010 Type 1 diabetes mellitus with ketoacidosis without coma: Secondary | ICD-10-CM | POA: Diagnosis not present

## 2020-07-02 DIAGNOSIS — R221 Localized swelling, mass and lump, neck: Secondary | ICD-10-CM | POA: Diagnosis present

## 2020-07-02 DIAGNOSIS — Z794 Long term (current) use of insulin: Secondary | ICD-10-CM | POA: Insufficient documentation

## 2020-07-02 DIAGNOSIS — F17220 Nicotine dependence, chewing tobacco, uncomplicated: Secondary | ICD-10-CM | POA: Insufficient documentation

## 2020-07-02 DIAGNOSIS — L0291 Cutaneous abscess, unspecified: Secondary | ICD-10-CM

## 2020-07-02 NOTE — ED Triage Notes (Signed)
Pt has red, raised area on back of neck that appeared approximately 2 weeks ago. Pt has been putting neosporin on it w/o relief.

## 2020-07-02 NOTE — ED Provider Notes (Signed)
Emergency Medicine Provider Triage Evaluation Note   HPI Abscess x 1-2 weeks. Worsening. Hx of same adjacent to current site, improved with prior drainage and abx. Denies fevers, neck stiffness, extremity numbness and paresthesias, active drainage.   Objective Blood pressure (!) 161/105, pulse (!) 110, temperature 98.8 F (37.1 C), temperature source Oral, resp. rate 16, SpO2 100 %.  Physical Exam HENT:     Head: Atraumatic.  Neck:     Comments: No meningismus Pulmonary:     Effort: Pulmonary effort is normal. No respiratory distress.  Skin:    Comments: Fluctuant abscess to posterior neck.   Neurological:     Mental Status: He is alert.     Coordination: Coordination normal.     Medical Decision Making Medically screening exam initiated at 11:54 PM Appropriate orders placed.  DEVIAN BARTOLOMEI was informed that the remainder of the evaluation will be completed by another provider, this initial triage assessment does not replace that evaluation, and the importance of remaining in the ED until their evaluation is complete.  Clinical Impression Abscess, posterior neck.    Antony Madura, PA-C 07/02/20 2358    Tegeler, Canary Brim, MD 07/03/20 [phone]

## 2020-07-03 MED ORDER — CEPHALEXIN 500 MG PO CAPS: 500.0000 mg | ORAL_CAPSULE | Freq: Four times a day (QID) | ORAL | 0 refills | Status: AC

## 2020-07-03 MED ORDER — DOXYCYCLINE HYCLATE 100 MG PO CAPS: 100.0000 mg | ORAL_CAPSULE | Freq: Two times a day (BID) | ORAL | 0 refills | Status: AC

## 2020-07-03 MED ORDER — LIDOCAINE-EPINEPHRINE (PF) 2 %-1:200000 IJ SOLN
10.0000 mL | Freq: Once | INTRAMUSCULAR | Status: AC
  Administered 2020-07-03: 10 mL via INTRADERMAL
  Filled 2020-07-03: qty 20

## 2020-07-03 NOTE — ED Provider Notes (Signed)
[facility] EMERGENCY DEPARTMENT Provider Note   CSN: [id number] Arrival date & time: 07/02/20  2342     History Chief Complaint  Patient presents with  . Abscess    Raymond [name] is a 35 y.o. male.  The history is provided by the patient and medical records.  Abscess Location:  Head/neck Head/neck abscess location:  L neck Size:  2cm Abscess quality: fluctuance, painful and redness   Red streaking: no   Duration:  1 week Progression:  Worsening Pain details:    Quality:  Sharp   Severity:  Mild   Timing:  Constant Chronicity:  Recurrent Relieved by:  Nothing Worsened by:  Nothing Ineffective treatments:  None tried Associated symptoms: no fatigue, no fever, no headaches, no nausea and no vomiting   Risk factors: prior abscess        Past Medical History:  Diagnosis Date  . Borderline personality disorder (HCC)   . Diabetes mellitus without complication (HCC)    Type 1  . Gallstones     Patient Active Problem List   Diagnosis Date Noted  . Hypoglycemia due to type 1 diabetes mellitus (HCC) 10/06/2018  . DKA (diabetic ketoacidoses) 10/04/2018  . 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 Episodic substance abuse 07/23/2014  . Severe recurrent major depression without psychotic features (HCC) 07/22/2014  . Suicide attempt [facility])     Past Surgical History:  Procedure Laterality Date  . CHOLECYSTECTOMY         History reviewed. No pertinent family history.  Social History   Tobacco Use  . Smoking status: Never Smoker  . Smokeless tobacco: Current User    Types: Chew  Vaping Use  . Vaping Use: Never used  Substance Use Topics  . Alcohol use: Yes  . Drug use: Yes    Types: Other-see comments, Methamphetamines    Comment: OTC cough medicine    Home Medications Prior to Admission medications   Medication Sig Start Date End Date Taking? Authorizing Provider  atorvastatin  (LIPITOR) 10 MG tablet Take 10 mg by mouth at bedtime. 11/30/19   [provider]  cephALEXin (KEFLEX) 500 MG capsule Take 1 capsule (500 mg total) by mouth 4 (four) times daily. 05/07/20   Terrilee Files, MD  doxycycline (VIBRAMYCIN) 100 MG capsule Take 1 capsule (100 mg total) by mouth 2 (two) times daily. 05/07/20   Terrilee Files, MD  lisinopril (ZESTRIL) 10 MG tablet Take 10 mg by mouth daily. 11/30/19   [provider]  Multiple Vitamins-Minerals (ADVANCED DIABETIC MULTIVITAMIN PO) Take 1 tablet by mouth daily.    [provider]  NOVOLIN 70/30 RELION (70-30) 100 UNIT/ML injection Inject 35 Units into the skin 2 (two) times a day. 07/06/18   [provider]    Allergies    Ibuprofen and Tramadol  Review of Systems   Review of Systems  Constitutional: Negative for chills, diaphoresis, fatigue and fever.  HENT: Negative for congestion.   Respiratory: Negative for chest tightness.   Cardiovascular: Negative for chest pain.  Gastrointestinal: Negative for abdominal pain, nausea and vomiting.  Genitourinary: Negative for flank pain.  Musculoskeletal: Negative for back pain, neck pain (mild pain on abscess) and neck stiffness.  Skin: Positive for wound.  Neurological: Negative for light-headedness and headaches.  Psychiatric/Behavioral: Negative for agitation and confusion.  All other systems reviewed and are negative.   Physical Exam Updated Vital Signs BP (!) 161/105 (BP Location: Right Arm)  Pulse (!) 110   Temp 98.8 F (37.1 C) (Oral)   Resp 16   SpO2 100%   Physical Exam Vitals and nursing note reviewed.  Constitutional:      General: He is not in acute distress.    Appearance: He is well-developed. He is not ill-appearing, toxic-appearing or diaphoretic.  HENT:     Head: Normocephalic and atraumatic.  Eyes:     Conjunctiva/sclera: Conjunctivae normal.     Pupils: Pupils are equal, round, and reactive to light.  Neck:    Cardiovascular:     Rate and Rhythm: Normal rate and regular rhythm.     Heart sounds: No murmur heard.   Pulmonary:     Effort: Pulmonary effort is normal. No respiratory distress.     Breath sounds: Normal breath sounds.  Abdominal:     Palpations: Abdomen is soft.     Tenderness: There is no abdominal tenderness. There is no right CVA tenderness, left CVA tenderness, guarding or rebound.  Musculoskeletal:        General: Tenderness present.     Cervical back: Neck supple. No crepitus. No pain with movement, spinous process tenderness or muscular tenderness.  Skin:    General: Skin is warm and dry.     Capillary Refill: Capillary refill takes less than 2 seconds.     Findings: Erythema present.  Neurological:     General: No focal deficit present.     Mental Status: He is alert.  Psychiatric:        Mood and Affect: Mood normal.     ED Results / Procedures / Treatments   Labs (all labs ordered are listed, but only abnormal results are displayed) Labs Reviewed - No data to display  EKG None  Radiology No results found.  Procedures .Marland KitchenIncision and Drainage  Date/Time: 07/03/2020 4:07 AM Performed by: Heide Scales, MD Authorized by: Heide Scales, MD   Consent:    Consent obtained:  Verbal   Consent given by:  Patient   Risks discussed:  Bleeding, incomplete drainage, pain and infection   Alternatives discussed:  No treatment Universal protocol:    Patient identity confirmed:  Verbally with patient and arm band Location:    Type:  Abscess   Size:  2cm   Location:  Neck   Neck location:  L posterior Pre-procedure details:    Skin preparation:  Chlorhexidine Sedation:    Sedation type:  None Anesthesia:    Anesthesia method:  Local infiltration   Local anesthetic:  Lidocaine 2% WITH epi Procedure type:    Complexity:  Simple Procedure details:    Ultrasound guidance: no     Needle aspiration: no     Incision types:  Single  straight   Incision depth:  Dermal   Wound management:  Probed and deloculated and irrigated with saline   Drainage:  Purulent   Drainage amount:  Moderate   Wound treatment:  Wound left open   Packing materials:  None Post-procedure details:    Procedure completion:  Tolerated well, no immediate complications     Medications Ordered in ED Medications  lidocaine-EPINEPHrine (XYLOCAINE W/EPI) 2 %-1:200000 (PF) injection 10 mL (10 mLs Intradermal Given 07/03/20 0341)    ED Course  I have reviewed the triage vital signs and the nursing notes.  Pertinent labs & imaging results that were available during my care of the patient were reviewed by me and considered in my medical decision making (see chart for details).  MDM Rules/Calculators/A&P                          Raymond [name] is a 35 y.o. male with a past medical history significant for diabetes with prior DKA, prior cholecystectomy, depression, and prior skin abscesses who presents with infection on neck.  Patient reports that over the last week or so he has had a boil develop on his head and neck and he reports that it likely needs to get drained and started on antibiotics.  He reports that this is happened several times over the years.  He reports that it is having minimal pain and has been occasionally draining.  He denies fevers, chills, chest pain, shortness of breath, nausea, vomiting, urinary symptoms or GI symptoms.  Reports he does not have any symptoms of systemic infection or other prior DKA.  He denies any significant headache or any neck pain with movement.  On exam, patient does have a 2 cm abscess to the back of his neck that is fluctuant.  It is minimally tender with no crepitance.  No red streaking seen.  Some mild erythema around it however.  Exam otherwise remarkable with clear breath sounds and nontender chest or back.  After discussion with patient, patient agrees with bedside drainage and antibiotic  initiation.  Abscess was drained without difficulty after anesthesia with lidocaine with epi.  We got a moderate mount of purulence out and drained.  It was washed with saline.  It was bandaged and patient will follow up with PCP and take the antibiotics he has tolerated and worked well in the past with doxycycline and Keflex.  Patient understood return precautions and plan of care and had no other questions or concerns.  Patient discharged in good condition.     Final Clinical Impression(s) / ED Diagnoses Final diagnoses:  Abscess  Cellulitis of neck    Rx / DC Orders ED Discharge Orders         Ordered    cephALEXin (KEFLEX) 500 MG capsule  4 times daily        07/03/20 0405    doxycycline (VIBRAMYCIN) 100 MG capsule  2 times daily        07/03/20 0405          Clinical Impression: 1. Abscess   2. Cellulitis of neck     Disposition: Discharge  Condition: Good  I have discussed the results, Dx and Tx plan with the pt(& family if present). He/she/they expressed understanding and agree(s) with the plan. Discharge instructions discussed at great length. Strict return precautions discussed and pt &/or family have verbalized understanding of the instructions. No further questions at time of discharge.    New Prescriptions   CEPHALEXIN (KEFLEX) 500 MG CAPSULE    Take 1 capsule (500 mg total) by mouth 4 (four) times daily for 10 days.   DOXYCYCLINE (VIBRAMYCIN) 100 MG CAPSULE    Take 1 capsule (100 mg total) by mouth 2 (two) times daily for 10 days.    Follow Up: Medicine, [facility] [address] [place] [address] [phone]     [facility] EMERGENCY DEPARTMENT [address] [id number] mc [place] [address] [phone]       Puneet Masoner, Canary Brim, MD 07/03/20 [phone]

## 2020-07-03 NOTE — ED Notes (Signed)
ED Provider at bedside. 

## 2020-07-03 NOTE — Discharge Instructions (Signed)
Your history and exam today are consistent with a small abscess or boil on the back of your neck.  There is also some surrounding redness concerning for mild cellulitis.  We performed incision and drainage and were able to get some purulence out of the infection.  Please take the antibiotics which you have tolerated and worked for you in the past to treat.  If any symptoms change or worsen acutely or you have symptoms of systemic infection, please return to the nearest emergency department.

## 2020-10-22 DIAGNOSIS — E1010 Type 1 diabetes mellitus with ketoacidosis without coma: Secondary | ICD-10-CM | POA: Insufficient documentation

## 2020-10-22 DIAGNOSIS — Z794 Long term (current) use of insulin: Secondary | ICD-10-CM | POA: Insufficient documentation

## 2020-10-22 DIAGNOSIS — L0211 Cutaneous abscess of neck: Secondary | ICD-10-CM | POA: Insufficient documentation

## 2020-10-22 DIAGNOSIS — F17220 Nicotine dependence, chewing tobacco, uncomplicated: Secondary | ICD-10-CM | POA: Insufficient documentation

## 2020-10-22 DIAGNOSIS — J340 Abscess, furuncle and carbuncle of nose: Secondary | ICD-10-CM | POA: Insufficient documentation

## 2020-10-22 DIAGNOSIS — U071 COVID-19: Secondary | ICD-10-CM | POA: Insufficient documentation

## 2020-10-23 ENCOUNTER — Emergency Department (HOSPITAL_COMMUNITY)
Admission: EM | Admit: 2020-10-23 | Discharge: 2020-10-23 | Disposition: A | Discharge status: Home / Self Care | Payer: Self-pay | Attending: Emergency Medicine | Admitting: Emergency Medicine

## 2020-10-23 ENCOUNTER — Other Ambulatory Visit: Payer: Self-pay

## 2020-10-23 ENCOUNTER — Encounter (HOSPITAL_COMMUNITY): Payer: Self-pay | Admitting: *Deleted

## 2020-10-23 ENCOUNTER — Emergency Department (HOSPITAL_COMMUNITY): Payer: Self-pay

## 2020-10-23 DIAGNOSIS — E1165 Type 2 diabetes mellitus with hyperglycemia: Secondary | ICD-10-CM

## 2020-10-23 DIAGNOSIS — L0201 Cutaneous abscess of face: Secondary | ICD-10-CM

## 2020-10-23 LAB — CBC WITH DIFFERENTIAL/PLATELET
Abs Immature Granulocytes: 0.03 10*3/uL (ref 0.00–0.07)
Basophils Absolute: 0.1 10*3/uL (ref 0.0–0.1)
Basophils Relative: 1 %
Eosinophils Absolute: 0.1 10*3/uL (ref 0.0–0.5)
Eosinophils Relative: 1 %
HCT: 39.5 % (ref 39.0–52.0)
Hemoglobin: 13.6 g/dL (ref 13.0–17.0)
Immature Granulocytes: 0 %
Lymphocytes Relative: 26 %
Lymphs Abs: 2.1 10*3/uL (ref 0.7–4.0)
MCH: 29.5 pg (ref 26.0–34.0)
MCHC: 34.4 g/dL (ref 30.0–36.0)
MCV: 85.7 fL (ref 80.0–100.0)
Monocytes Absolute: 0.5 10*3/uL (ref 0.1–1.0)
Monocytes Relative: 7 %
Neutro Abs: 5.3 10*3/uL (ref 1.7–7.7)
Neutrophils Relative %: 65 %
Platelets: 403 10*3/uL — ABNORMAL HIGH (ref 150–400)
RBC: 4.61 MIL/uL (ref 4.22–5.81)
RDW: 11.7 % (ref 11.5–15.5)
WBC: 8 10*3/uL (ref 4.0–10.5)
nRBC: 0 % (ref 0.0–0.2)

## 2020-10-23 LAB — BASIC METABOLIC PANEL
Anion gap: 10 (ref 5–15)
BUN: 20 mg/dL (ref 6–20)
CO2: 25 mmol/L (ref 22–32)
Calcium: 9.4 mg/dL (ref 8.9–10.3)
Chloride: 94 mmol/L — ABNORMAL LOW (ref 98–111)
Creatinine, Ser: 1.26 mg/dL — ABNORMAL HIGH (ref 0.61–1.24)
GFR, Estimated: >60 mL/min (ref >60)
Glucose, Bld: 472 mg/dL — ABNORMAL HIGH (ref 70–99)
Potassium: 4.7 mmol/L (ref 3.5–5.1)
Sodium: 129 mmol/L — ABNORMAL LOW (ref 135–145)

## 2020-10-23 LAB — CBG MONITORING, ED
Glucose-Capillary: 398 mg/dL — ABNORMAL HIGH (ref 70–99)
Glucose-Capillary: 405 mg/dL — ABNORMAL HIGH (ref 70–99)
Glucose-Capillary: 463 mg/dL — ABNORMAL HIGH (ref 70–99)

## 2020-10-23 MED ORDER — LIDOCAINE-EPINEPHRINE (PF) 2 %-1:200000 IJ SOLN: 20.0000 mL | Freq: Once | INTRAMUSCULAR | Status: DC

## 2020-10-23 MED ORDER — IOHEXOL 350 MG/ML SOLN
75.0000 mL | Freq: Once | INTRAVENOUS | Status: AC | PRN
  Administered 2020-10-23: 75 mL via INTRAVENOUS

## 2020-10-23 MED ORDER — DOXYCYCLINE HYCLATE 100 MG PO TABS
100.0000 mg | ORAL_TABLET | Freq: Once | ORAL | Status: AC
  Administered 2020-10-23: 100 mg via ORAL
  Filled 2020-10-23: qty 1

## 2020-10-23 MED ORDER — VANCOMYCIN HCL 1750 MG/350ML IV SOLN
1750.0000 mg | Freq: Once | INTRAVENOUS | Status: AC
  Administered 2020-10-23: 1750 mg via INTRAVENOUS
  Filled 2020-10-23: qty 350

## 2020-10-23 MED ORDER — FENTANYL CITRATE (PF) 100 MCG/2ML IJ SOLN
50.0000 ug | Freq: Once | INTRAMUSCULAR | Status: AC
  Administered 2020-10-23: 50 ug via INTRAVENOUS
  Filled 2020-10-23: qty 2

## 2020-10-23 MED ORDER — SODIUM CHLORIDE 0.9 % IV BOLUS
1000.0000 mL | Freq: Once | INTRAVENOUS | Status: AC
  Administered 2020-10-23: 1000 mL via INTRAVENOUS

## 2020-10-23 MED ORDER — ONDANSETRON HCL 4 MG/2ML IJ SOLN
4.0000 mg | Freq: Once | INTRAMUSCULAR | Status: AC
  Administered 2020-10-23: 4 mg via INTRAVENOUS
  Filled 2020-10-23: qty 2

## 2020-10-23 MED ORDER — DOXYCYCLINE HYCLATE 100 MG PO CAPS: 100.0000 mg | ORAL_CAPSULE | Freq: Two times a day (BID) | ORAL | 0 refills | Status: DC

## 2020-10-23 NOTE — ED Triage Notes (Signed)
Pt reports he had a pimple on his nose that has become worse (redness, drainage)  also has an area on the right neck area. COVID + beginning of the week. Tool Excedrin PTA. Cbg 463, took insulin tonight.

## 2020-10-23 NOTE — ED Provider Notes (Signed)
MSE was initiated and I personally evaluated the patient and placed orders (if any) at  12:43 AM on October 23, 2020.  Patient with history of T1DM, sober x 2 weeks, presents with infection/abscess on nose and right submandibular areas. No known fever, +nausea without vomiting. He admits he does not check his CBG regularly but is compliant with all medications.   Today's Vitals   10/23/20 0002  BP: (!) 183/112  Pulse: 93  Resp: 16  Temp: 98.2 F (36.8 C)  SpO2: 99%   There is no height or weight on file to calculate BMI.  Abscesses to exterior nose associated with generalized swelling, and right submandible. No active drainage.   CBG 463.  The patient appears stable so that the remainder of the MSE may be completed by another provider.   Elpidio Anis, PA-C 10/23/20 0045    Dione Booze, MD 10/23/20 [phone]

## 2020-10-23 NOTE — Progress Notes (Signed)
Inpatient Diabetes Program Recommendations  AACE/ADA: New Consensus Statement on Inpatient Glycemic Control (2015)  Target Ranges:  Prepandial:   less than 140 mg/dL      Peak postprandial:   less than 180 mg/dL (1-2 hours)      Critically ill patients:  140 - 180 mg/dL   Lab Results  Component Value Date   GLUCAP 398 (H) 10/23/2020   HGBA1C 12.1 (H) 10/05/2018    Review of Glycemic Control Results for Raymond [name], Raymond [name] (MRN [id number]) as of 10/23/2020 11:22  Ref. Range 10/23/2020 00:43 10/23/2020 07:42 10/23/2020 10:58  Glucose-Capillary Latest Ref Range: 70 - 99 mg/dL 665 (H) 993 (H) 570 (H)   Diabetes history: DM type 1 Outpatient Diabetes medications: 70/30 35 units bid Current orders for Inpatient glycemic control:  None being evaluated in ED  Hyperglycemia in ED in the 400 range  Inpatient Diabetes Program Recommendations:    - consider Novolog 0-15 units Q4 hours while in ED  Thanks, Christena Deem RN, MSN, BC-ADM Inpatient Diabetes Coordinator Team Pager [phone] (8a-5p)

## 2020-10-23 NOTE — ED Provider Notes (Signed)
[facility] EMERGENCY DEPARTMENT Provider Note   CSN: [id number] Arrival date & time: 10/22/20  2355     History Chief Complaint  Patient presents with   Abscess    Raymond [name] is a 35 y.o. male with a past medical history of type 1 diabetes who presents emergency department for evaluation of abscess and cellulitis of the face.  Patient was recently diagnosed with COVID 4 days ago.  He he states that he does not check his blood sugars regularly "because is just not my personality."  He is unsure if his sugars have been running high but developed a pimple on the surface of his nose that rapidly progressed into severe painful swelling of the entire nasal cavity.  He also has associated pressure and pain behind his right eye and throbbing in the right temporal region.  The patient also has a small abscess under the right mandibular/submental region.  He rates the pain as 8 out of 10.  He denies any pain or difficulty with swallowing.  The history is provided by the patient. No language interpreter was used.  Abscess     Past Medical History:  Diagnosis Date   Borderline personality disorder (HCC)    Diabetes mellitus without complication (HCC)    Type 1   Gallstones     Patient Active Problem List   Diagnosis Date Noted   Hypoglycemia due to type 1 diabetes mellitus (HCC) 10/06/2018   DKA (diabetic ketoacidoses) 10/04/2018   MDD (major depressive disorder), recurrent severe, without psychosis (HCC) 12/27/2016   Episodic substance abuse 07/23/2014   Severe recurrent major depression without psychotic features (HCC) 07/22/2014   Suicide attempt [facility])     Past Surgical History:  Procedure Laterality Date   CHOLECYSTECTOMY         No family history on file.  Social History   Tobacco Use   Smoking status: Never   Smokeless tobacco: Current    Types: Chew  Vaping Use   Vaping Use: Never used  Substance Use Topics   Alcohol use: Yes   Drug use:  Yes    Types: Other-see comments, Methamphetamines    Comment: OTC cough medicine    Home Medications Prior to Admission medications   Medication Sig Start Date End Date Taking? Authorizing Provider  atorvastatin (LIPITOR) 10 MG tablet Take 10 mg by mouth at bedtime. 11/30/19   [provider]  cephALEXin (KEFLEX) 500 MG capsule Take 1 capsule (500 mg total) by mouth 4 (four) times daily. 05/07/20   Terrilee Files, MD  doxycycline (VIBRAMYCIN) 100 MG capsule Take 1 capsule (100 mg total) by mouth 2 (two) times daily. 05/07/20   Terrilee Files, MD  lisinopril (ZESTRIL) 10 MG tablet Take 10 mg by mouth daily. 11/30/19   [provider]  Multiple Vitamins-Minerals (ADVANCED DIABETIC MULTIVITAMIN PO) Take 1 tablet by mouth daily.    [provider]  NOVOLIN 70/30 RELION (70-30) 100 UNIT/ML injection Inject 35 Units into the skin 2 (two) times a day. 07/06/18   [provider]    Allergies    Ibuprofen and Tramadol  Review of Systems   Review of Systems Ten systems reviewed and are negative for acute change, except as noted in the HPI.   Physical Exam Updated Vital Signs BP (!) 164/99   Pulse 85   Temp 98.2 F (36.8 C)   Resp 16   SpO2 100%   Physical Exam Vitals and nursing note reviewed.  Constitutional:      General: He is not in acute distress.    Appearance: He is well-developed. He is not diaphoretic.  HENT:     Head: Normocephalic and atraumatic.  Eyes:     General: No scleral icterus.    Conjunctiva/sclera: Conjunctivae normal.  Cardiovascular:     Rate and Rhythm: Normal rate and regular rhythm.     Heart sounds: Normal heart sounds.  Pulmonary:     Effort: Pulmonary effort is normal. No respiratory distress.     Breath sounds: Normal breath sounds.  Abdominal:     Palpations: Abdomen is soft.     Tenderness: There is no abdominal tenderness.  Musculoskeletal:     Cervical back: Normal range of motion and neck supple.  Skin:     Capillary Refill: Capillary refill takes less than 2 seconds.     Comments: Large nodule with central ulceration and purulent drainage on the right side of the nose.  There is marked swelling of the entire nose, nasal ala and septal region with exquisite tenderness and erythema.  There is swelling extending under the right eye.  No pain with eye movement. To centimeter tender, erythematous, well-circumscribed nodular lesion with central ulceration under the angle of the right mandible with obvious purulence centrally.  No surrounding induration.   Neurological:     Mental Status: He is alert.  Psychiatric:        Behavior: Behavior normal.    ED Results / Procedures / Treatments   Labs (all labs ordered are listed, but only abnormal results are displayed) Labs Reviewed  CBC WITH DIFFERENTIAL/PLATELET - Abnormal; Notable for the following components:      Result Value   Platelets 403 (*)    All other components within normal limits  BASIC METABOLIC PANEL - Abnormal; Notable for the following components:   Sodium 129 (*)    Chloride 94 (*)    Glucose, Bld 472 (*)    Creatinine, Ser 1.26 (*)    All other components within normal limits  CBG MONITORING, ED - Abnormal; Notable for the following components:   Glucose-Capillary 463 (*)    All other components within normal limits    EKG None  Radiology No results found.  Procedures .Marland KitchenIncision and Drainage  Date/Time: 10/23/2020 10:24 AM Performed by: Arthor Captain, PA-C Authorized by: Arthor Captain, PA-C   Consent:    Consent obtained:  Verbal   Consent given by:  Patient   Risks discussed:  Bleeding, incomplete drainage, pain and damage to other organs   Alternatives discussed:  No treatment Universal protocol:    Procedure explained and questions answered to patient or proxy's satisfaction: yes     Relevant documents present and verified: yes     Test results available : yes     Imaging studies available: yes      Required blood products, implants, devices, and special equipment available: yes     Site/side marked: yes     Immediately prior to procedure, a time out was called: yes     Patient identity confirmed:  Verbally with patient Location:    Type:  Abscess   Size:  1cm   Location:  Head   Head location:  Nose Pre-procedure details:    Skin preparation:  Betadine and povidone-iodine Sedation:    Sedation type:  None Anesthesia:    Anesthesia method:  Local infiltration   Local anesthetic:  Lidocaine 1% WITH epi Procedure type:    Complexity:  Simple Procedure details:    Incision types:  Single straight   Incision depth:  Subcutaneous   Wound management:  Probed and deloculated, irrigated with saline and extensive cleaning   Drainage:  Purulent and bloody   Drainage amount:  Moderate   Wound treatment:  Wound left open Post-procedure details:    Procedure completion:  Tolerated well, no immediate complications .Marland KitchenIncision and Drainage  Date/Time: 10/23/2020 10:25 AM Performed by: Arthor Captain, PA-C Authorized by: Arthor Captain, PA-C   Consent:    Consent obtained:  Verbal   Consent given by:  Patient   Risks discussed:  Bleeding, incomplete drainage, pain and damage to other organs   Alternatives discussed:  No treatment Universal protocol:    Procedure explained and questions answered to patient or proxy's satisfaction: yes     Relevant documents present and verified: yes     Test results available : yes     Imaging studies available: yes     Required blood products, implants, devices, and special equipment available: yes     Site/side marked: yes     Immediately prior to procedure, a time out was called: yes     Patient identity confirmed:  Verbally with patient Location:    Type:  Abscess   Size:  2 cm   Location:  Neck   Neck location: R submandibular` Pre-procedure details:    Skin preparation:  Betadine Anesthesia:    Anesthesia method:  Local infiltration    Local anesthetic:  Lidocaine 1% WITH epi Procedure type:    Complexity:  Simple Procedure details:    Incision types:  Single straight   Incision depth:  Subcutaneous   Wound management:  Irrigated with saline, extensive cleaning and probed and deloculated   Drainage:  Purulent   Drainage amount:  Moderate Post-procedure details:    Procedure completion:  Tolerated well, no immediate complications   Medications Ordered in ED Medications  sodium chloride 0.9 % bolus 1,000 mL (has no administration in time range)  fentaNYL (SUBLIMAZE) injection 50 mcg (has no administration in time range)  ondansetron (ZOFRAN) injection 4 mg (has no administration in time range)  doxycycline (VIBRA-TABS) tablet 100 mg (100 mg Oral Given 10/23/20 0051)    ED Course  I have reviewed the triage vital signs and the nursing notes.  Pertinent labs & imaging results that were available during my care of the patient were reviewed by me and considered in my medical decision making (see chart for details).  Clinical Course as of 10/23/20 1612  Thu Oct 23, 2020  0731 Glucose(!): 472 [AH]  0731 Sodium(!): 129 [AH]    Clinical Course User Index [AH] Arthor Captain, PA-C   MDM Rules/Calculators/A&P                          35 year old male here with past medical history of type 1 diabetes, he is not compliant with strict medication regimen or treatment of his diabetes does not regularly check his blood sugars but did take insulin this morning.  Recently diagnosed with COVID.  He is unsure if his this is driven up his blood sugar levels but he is feeling better regarding his COVID symptoms and has improvement in cough.  Patient has obvious facial cellulitis secondary to abscess of the nose and a small abscess under the mandible.  Given the fact that he has significant pressure especially behind his right I have concern for potential dural venous thrombosis  and will obtain CT venogram and CT of the face as well.   Patient will be given fluids, Vanco, pain control and I will attempt to incise and drain the small abscesses on his face.   Patient with successful I&D of facial abscesses.  I ordered and reviewed CT imaging including CT maxillofacial with contrast and CT venogram of the brain.  No evidence of deep facial abscess or dural venous thrombosis given central cellulitis of the face.  Patient given fluids with some improvement in his blood sugar however it has not significantly improved I suspect that with improved outpatient management including actually taking his blood sugars regularly, treating his infection and taking his medications appropriately this should improve but he will need very close outpatient follow-up with his primary care physician.  Patient will be discharged on doxycycline.  He was given vancomycin here in the ED with improvement in his pain and symptoms here. Final Clinical Impression(s) / ED Diagnoses Final diagnoses:  None    Rx / DC Orders ED Discharge Orders     None        Arthor CaptainHarris, Malajah Oceguera, PA-C 10/23/20 1614    Linwood DibblesKnapp, Jon, MD 10/25/20 1351

## 2020-10-23 NOTE — Discharge Instructions (Addendum)
Please change her dressing if the gauze is soaked with blood.  You may apply ice to your nose and to the abscess under your chin.  Take Motrin and Tylenol for pain if possible.. It is critically important that especially during this time when he have a recent viral and skin infection that you make sure to check your blood sugars as directed by your primary care doctor and take your insulin as appropriate. Get help right away if: You have severe pain or bleeding. You cannot eat or drink without vomiting. You have decreased urine output. You become short of breath. You have chest pain. You cough up blood. The affected area becomes numb or starts to tingle.

## 2020-11-21 ENCOUNTER — Emergency Department (HOSPITAL_COMMUNITY)
Admission: EM | Admit: 2020-11-21 | Discharge: 2020-11-21 | Disposition: A | Discharge status: Home / Self Care | Payer: Self-pay | Attending: Emergency Medicine | Admitting: Emergency Medicine

## 2020-11-21 ENCOUNTER — Other Ambulatory Visit: Payer: Self-pay

## 2020-11-21 ENCOUNTER — Encounter (HOSPITAL_COMMUNITY): Payer: Self-pay

## 2020-11-21 DIAGNOSIS — E10649 Type 1 diabetes mellitus with hypoglycemia without coma: Secondary | ICD-10-CM | POA: Insufficient documentation

## 2020-11-21 DIAGNOSIS — F17220 Nicotine dependence, chewing tobacco, uncomplicated: Secondary | ICD-10-CM | POA: Insufficient documentation

## 2020-11-21 DIAGNOSIS — E1010 Type 1 diabetes mellitus with ketoacidosis without coma: Secondary | ICD-10-CM | POA: Insufficient documentation

## 2020-11-21 DIAGNOSIS — R21 Rash and other nonspecific skin eruption: Secondary | ICD-10-CM | POA: Insufficient documentation

## 2020-11-21 MED ORDER — PERMETHRIN 5 % EX CREA: TOPICAL_CREAM | CUTANEOUS | 0 refills | Status: DC

## 2020-11-21 NOTE — Discharge Instructions (Addendum)
If you develop fevers, your symptoms worsen, any one of your wounds becomes very red or painful, you have obvious large amounts of yellowish thick drainage from your wound, or have any other concerns please seek additional medical care. As we discussed rashes can change in their appearance.  Is important that in addition to using the cream you follow the recommended environmental precautions.

## 2020-11-21 NOTE — ED Provider Notes (Signed)
[facility] EMERGENCY DEPARTMENT Provider Note   CSN: [id number] Arrival date & time: 11/21/20  1511     History Chief Complaint  Patient presents with   Rash    Raymond [name] is a 35 y.o. male With a past medical history of DM1 who presents today for evaluation of 3 days of itchy body wide rash.  He denies any fevers or lymph node swelling.  He denies any known contact with anyone with monkey pox or close contact with any when you have sex with men.  He has 1 partner that is male and she does not have any similar rashes.  He states that the rash is very pruritic.   He states based on his research PTA he thinks he has scabies.     HPI     Past Medical History:  Diagnosis Date   Borderline personality disorder (HCC)    Diabetes mellitus without complication (HCC)    Type 1   Gallstones     Patient Active Problem List   Diagnosis Date Noted   Hypoglycemia due to type 1 diabetes mellitus (HCC) 10/06/2018   DKA (diabetic ketoacidoses) 10/04/2018   MDD (major depressive disorder), recurrent severe, without psychosis (HCC) 12/27/2016   Episodic substance abuse 07/23/2014   Severe recurrent major depression without psychotic features (HCC) 07/22/2014   Suicide attempt [facility])     Past Surgical History:  Procedure Laterality Date   CHOLECYSTECTOMY         History reviewed. No pertinent family history.  Social History   Tobacco Use   Smoking status: Never   Smokeless tobacco: Current    Types: Chew  Vaping Use   Vaping Use: Never used  Substance Use Topics   Alcohol use: Yes   Drug use: Yes    Types: Other-see comments, Methamphetamines    Comment: OTC cough medicine    Home Medications Prior to Admission medications   Medication Sig Start Date End Date Taking? Authorizing Provider  permethrin (ELIMITE) 5 % cream Apply from head to toe, Leave on for 10 hours then wash off.  Repeat once in 10 days 11/21/20  Yes Cristina Gong, PA-C   atorvastatin (LIPITOR) 10 MG tablet Take 10 mg by mouth at bedtime. 11/30/19   [provider]  doxycycline (VIBRAMYCIN) 100 MG capsule Take 1 capsule (100 mg total) by mouth 2 (two) times daily. One po bid x 7 days 10/23/20   Arthor Captain, PA-C  lisinopril (ZESTRIL) 10 MG tablet Take 10 mg by mouth daily. 11/30/19   [provider]  NOVOLIN 70/30 RELION (70-30) 100 UNIT/ML injection Inject 35 Units into the skin 2 (two) times a day. 07/06/18   [provider]    Allergies    Ibuprofen and Tramadol  Review of Systems   Review of Systems  Constitutional:  Negative for fever.  Skin:  Positive for rash.  Neurological:  Negative for weakness and headaches.  All other systems reviewed and are negative.  Physical Exam Updated Vital Signs BP (!) 116/93 (BP Location: Right Arm)   Pulse 86   Temp 98.4 F (36.9 C) (Oral)   Resp 16   Ht 5\' 10"  (1.778 m)   Wt 81.6 kg   SpO2 98%   BMI 25.83 kg/m   Physical Exam Vitals and nursing note reviewed.  Constitutional:      General: He is not in acute distress. HENT:     Head: Normocephalic and atraumatic.  Cardiovascular:  Rate and Rhythm: Normal rate.  Pulmonary:     Effort: Pulmonary effort is normal. No respiratory distress.  Musculoskeletal:     Cervical back: No rigidity.  Lymphadenopathy:     Cervical: No cervical adenopathy.  Skin:    Comments: And is examined.  There is a diffuse red papular rash on bilateral axilla, abdomen, and, bilateral arms.  GU exam is deferred.  Patient states the rash there looks the same as on the visualized areas.  There are no domed or umbilicated lesions.  There are secondary burrows visualized on the arms bilaterally.  No obvious signs of secondary bacterial infection.  Neurological:     Mental Status: He is alert. Mental status is at baseline.     Comments: Awake and alert, answers all questions appropriately.  Speech is not slurred.    Psychiatric:        Mood and  Affect: Mood normal.        Behavior: Behavior normal.    ED Results / Procedures / Treatments   Labs (all labs ordered are listed, but only abnormal results are displayed) Labs Reviewed - No data to display  EKG None  Radiology No results found.  Procedures Procedures   Medications Ordered in ED Medications - No data to display  ED Course  I have reviewed the triage vital signs and the nursing notes.  Pertinent labs & imaging results that were available during my care of the patient were reviewed by me and considered in my medical decision making (see chart for details).    MDM Rules/Calculators/A&P                         Patient is a 35 year old man who presents today for evaluation of 3 days of body wide very itchy rash. He is evaluated in the context of a monkey proximal outbreak.  He denies any high risk exposures or known contacts with multi pox, does not have fevers and otherwise feels well.  He denies any lymphadenopathy, of the visualized lesions he states that the ones that I did not see on his genitals look similar.  His rash does have burrows consistent with scabies.  At this point he does not appear to require testing for multiplex.  Clinically he has scabies.  We will treat this with cream.  We discussed the importance of environmental treatments also and he states his understanding.  Work note is given.  We did discuss that he needs to return if he develops any symptoms concerning or worsening and he states his understanding.  At this time he does not have any obvious lesions with secondary infections.  Return precautions were discussed with patient who states their understanding.  At the time of discharge patient denied any unaddressed complaints or concerns.  Patient is agreeable for discharge home.  Note: Portions of this report may have been transcribed using voice recognition software. Every effort was made to ensure accuracy; however, inadvertent  computerized transcription errors may be present   Final Clinical Impression(s) / ED Diagnoses Final diagnoses:  Rash    Rx / DC Orders ED Discharge Orders          Ordered    permethrin (ELIMITE) 5 % cream        11/21/20 1602             Cristina Gong, PA-C 11/21/20 1609    Ernie Avena, MD 11/21/20 2114

## 2020-11-21 NOTE — ED Triage Notes (Signed)
Pt came in POV from home d/t having a rash on his arms/legs & groin that shows up at random places & times that he feels is scabies. The rash itches very bad, looks like tracts, & comes out in knits. Denies any contact with any substances he is allergic too, no difficulty breathing, no fevers.

## 2021-02-04 ENCOUNTER — Other Ambulatory Visit: Payer: Self-pay

## 2021-02-04 ENCOUNTER — Emergency Department (HOSPITAL_COMMUNITY)
Admission: EM | Admit: 2021-02-04 | Discharge: 2021-02-04 | Disposition: A | Discharge status: Home / Self Care | Payer: Self-pay | Attending: Emergency Medicine | Admitting: Emergency Medicine

## 2021-02-04 ENCOUNTER — Encounter (HOSPITAL_COMMUNITY): Payer: Self-pay | Admitting: Emergency Medicine

## 2021-02-04 DIAGNOSIS — L0212 Furuncle of neck: Secondary | ICD-10-CM | POA: Insufficient documentation

## 2021-02-04 DIAGNOSIS — I10 Essential (primary) hypertension: Secondary | ICD-10-CM | POA: Insufficient documentation

## 2021-02-04 DIAGNOSIS — E119 Type 2 diabetes mellitus without complications: Secondary | ICD-10-CM | POA: Insufficient documentation

## 2021-02-04 DIAGNOSIS — Z79899 Other long term (current) drug therapy: Secondary | ICD-10-CM | POA: Insufficient documentation

## 2021-02-04 DIAGNOSIS — L0292 Furuncle, unspecified: Secondary | ICD-10-CM

## 2021-02-04 MED ORDER — ACETAMINOPHEN ER 650 MG PO TBCR: 650.0000 mg | EXTENDED_RELEASE_TABLET | Freq: Three times a day (TID) | ORAL | 0 refills | Status: AC | PRN

## 2021-02-04 MED ORDER — LISINOPRIL 10 MG PO TABS: 10.0000 mg | ORAL_TABLET | Freq: Every day | ORAL | 0 refills | Status: DC

## 2021-02-04 MED ORDER — AMOXICILLIN-POT CLAVULANATE 875-125 MG PO TABS: 1.0000 | ORAL_TABLET | Freq: Two times a day (BID) | ORAL | 0 refills | Status: DC

## 2021-02-04 MED ORDER — BACITRACIN ZINC 500 UNIT/GM EX OINT
1.0000 "application " | TOPICAL_OINTMENT | Freq: Once | CUTANEOUS | Status: AC
  Administered 2021-02-04: 1 via TOPICAL

## 2021-02-04 MED ORDER — AMOXICILLIN-POT CLAVULANATE 875-125 MG PO TABS
1.0000 | ORAL_TABLET | Freq: Once | ORAL | Status: AC
  Administered 2021-02-04: 22:00:00 1 via ORAL
  Filled 2021-02-04: qty 1

## 2021-02-04 MED ORDER — ACETAMINOPHEN 325 MG PO TABS
650.0000 mg | ORAL_TABLET | Freq: Once | ORAL | Status: AC
  Administered 2021-02-04: 22:00:00 650 mg via ORAL
  Filled 2021-02-04: qty 2

## 2021-02-04 NOTE — Discharge Instructions (Addendum)
Follow-up with your primary care doctor to check on your blood pressure.  Start taking your Zestril medication.  Take the antibiotics as prescribed.  Apply warm compresses to help with the swelling.  Follow-up with a primary care doctor to be rechecked

## 2021-02-04 NOTE — ED Provider Notes (Signed)
[facility] EMERGENCY DEPARTMENT Provider Note   CSN: [id number] Arrival date & time: 02/04/21  1453     History Chief complaint: Neck swelling  Raymond [name] is a 35 y.o. male.  HPI  Patient presents to the ED for evaluation of a swollen area on the right side of his neck.  Patient states initially started as a pimple.  Over the last several days it has gotten bigger in size.  There has been some drainage as well from the wound.  He is not having any fevers or chills.  No difficulty swallowing.  No difficulty breathing.  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  Patient also has history of hypertension and diabetes but does not take his medications as prescribed  Past Medical History:  Diagnosis Date   Borderline personality disorder (HCC)    Diabetes mellitus without complication (HCC)    Type 1   Gallstones     Patient Active Problem List   Diagnosis Date Noted   Hypoglycemia due to type 1 diabetes mellitus (HCC) 10/06/2018   DKA (diabetic ketoacidoses) 10/04/2018   MDD (major depressive disorder), recurrent severe, without psychosis (HCC) 12/27/2016   Episodic substance abuse 07/23/2014   Severe recurrent major depression without psychotic features (HCC) 07/22/2014   Suicide attempt [facility])     Past Surgical History:  Procedure Laterality Date   CHOLECYSTECTOMY         History reviewed. No pertinent family history.  Social History   Tobacco Use   Smoking status: Never   Smokeless tobacco: Current    Types: Chew  Vaping Use   Vaping Use: Never used  Substance Use Topics   Alcohol use: Yes   Drug use: Yes    Types: Other-see comments, Methamphetamines    Comment: OTC cough medicine    Home Medications Prior to Admission medications   Medication Sig Start Date End Date Taking? Authorizing Provider  acetaminophen (TYLENOL 8 HOUR) 650 MG CR tablet Take 1 tablet (650 mg total) by mouth every 8 (eight) hours as needed for up to  10 days for pain. 02/04/21 02/14/21 Yes Linwood Dibbles, MD  amoxicillin-clavulanate (AUGMENTIN) 875-125 MG tablet Take 1 tablet by mouth 2 (two) times daily. 02/04/21  Yes Linwood Dibbles, MD  atorvastatin (LIPITOR) 10 MG tablet Take 10 mg by mouth at bedtime. 11/30/19   [provider]  doxycycline (VIBRAMYCIN) 100 MG capsule Take 1 capsule (100 mg total) by mouth 2 (two) times daily. One po bid x 7 days 10/23/20   Arthor Captain, PA-C  lisinopril (ZESTRIL) 10 MG tablet Take 1 tablet (10 mg total) by mouth daily. 02/04/21 03/06/21  Linwood Dibbles, MD  NOVOLIN 70/30 RELION (70-30) 100 UNIT/ML injection Inject 35 Units into the skin 2 (two) times a day. 07/06/18   [provider]  permethrin (ELIMITE) 5 % cream Apply from head to toe, Leave on for 10 hours then wash off.  Repeat once in 10 days 11/21/20   Cristina Gong, PA-C    Allergies    Ibuprofen and Tramadol  Review of Systems   Review of Systems  All other systems reviewed and are negative.  Physical Exam Updated Vital Signs BP (!) 172/110 (BP Location: Right Arm)   Pulse 93   Temp 98.1 F (36.7 C) (Oral)   Resp 18   SpO2 100%   Physical Exam Vitals and nursing note reviewed.  Constitutional:      General: He is not in acute distress.  Appearance: He is well-developed.  HENT:     Head: Normocephalic and atraumatic.     Comments: 1 to 2 cm area area of swelling on the right posterior lateral neck, pustule noted at the surface of the skin, area is indurated and tender, no fluctuance, no lymphangitic streaking    Right Ear: External ear normal.     Left Ear: External ear normal.  Eyes:     General: No scleral icterus.       Right eye: No discharge.        Left eye: No discharge.     Conjunctiva/sclera: Conjunctivae normal.  Neck:     Trachea: No tracheal deviation.  Cardiovascular:     Rate and Rhythm: Normal rate and regular rhythm.  Pulmonary:     Effort: Pulmonary effort is normal. No respiratory distress.      Breath sounds: Normal breath sounds. No stridor. No wheezing or rales.  Abdominal:     General: Bowel sounds are normal. There is no distension.     Palpations: Abdomen is soft.     Tenderness: There is no abdominal tenderness. There is no guarding or rebound.  Musculoskeletal:        General: No tenderness or deformity.     Cervical back: Neck supple.  Skin:    General: Skin is warm and dry.     Findings: No rash.  Neurological:     General: No focal deficit present.     Mental Status: He is alert.     Cranial Nerves: No cranial nerve deficit (no facial droop, extraocular movements intact, no slurred speech).     Sensory: No sensory deficit.     Motor: No abnormal muscle tone or seizure activity.     Coordination: Coordination normal.  Psychiatric:        Mood and Affect: Mood normal.    ED Results / Procedures / Treatments   Labs (all labs ordered are listed, but only abnormal results are displayed) Labs Reviewed - No data to display  EKG None  Radiology No results found.  Procedures Procedures   Medications Ordered in ED Medications  amoxicillin-clavulanate (AUGMENTIN) 875-125 MG per tablet 1 tablet (has no administration in time range)  acetaminophen (TYLENOL) tablet 650 mg (has no administration in time range)  bacitracin ointment 1 application (has no administration in time range)    ED Course  I have reviewed the triage vital signs and the nursing notes.  Pertinent labs & imaging results that were available during my care of the patient were reviewed by me and considered in my medical decision making (see chart for details).    MDM Rules/Calculators/A&P                          Brief bedside ultrasound performed.  No evidence fluid collection amenable to drainage.  We will start patient on course of oral antibiotics.  Recommended warm compresses.  Patient also noted to be hypertensive.  We will restart his blood pressure medications.  Encourage follow-up  with a primary care doctor.  Final Clinical Impression(s) / ED Diagnoses Final diagnoses:  Furuncle  Hypertension, unspecified type    Rx / DC Orders ED Discharge Orders          Ordered    acetaminophen (TYLENOL 8 HOUR) 650 MG CR tablet  Every 8 hours PRN        02/04/21 2132    amoxicillin-clavulanate (AUGMENTIN) 875-125 MG tablet  2 times daily        02/04/21 2132    lisinopril (ZESTRIL) 10 MG tablet  Daily        02/04/21 2132             Dorie Rank, MD 02/04/21 2145

## 2021-02-04 NOTE — ED Provider Notes (Signed)
Emergency Medicine Provider Triage Evaluation Note  Raymond [name] , a 35 y.o. male  was evaluated in triage.  Pt complains of right neck abscess.  Patient presents with 1 week of worsening right neck abscess.  Patient states started as a pimple and has gotten gradually bigger.  He had a methamphetamine relapse this week and says that since he used, is got much worse.  Denies any fevers or chills.  Review of Systems  Positive: abscess Negative: Fevers, chills  Physical Exam  BP (!) 172/110 (BP Location: Right Arm)   Pulse 93   Temp 98.1 F (36.7 C) (Oral)   Resp 18   SpO2 100%  Gen:   Awake, no distress   Resp:  Normal effort  MSK:   Moves extremities without difficulty  Other:  1 x 1 cm abscess to right posterior neck. Fluctuant. Erythematous with what appears to be some minimal purulent drainage coming out  Medical Decision Making  Medically screening exam initiated at 5:49 PM.  Appropriate orders placed.  ADEM COSTLOW was informed that the remainder of the evaluation will be completed by another provider, this initial triage assessment does not replace that evaluation, and the importance of remaining in the ED until their evaluation is complete.  Needs to be drained. Do not think we need imaging. It is superficial   Claudie Leach, PA-C 02/04/21 1751    Linwood Dibbles, MD 02/04/21 2241

## 2021-02-04 NOTE — ED Notes (Signed)
DC instructions reviewed with pt. Pt verbalized understanding.  PT DC.  

## 2021-02-04 NOTE — ED Triage Notes (Signed)
Pt here for abscess on R side of neck. Pt noticed a pimple 5 days ago, pt had a relapse w/ meth 2 days ago and since then the pimple has grown. Pt has large cyst, redness and swelling noted around the area. Pt c/o 8/10 pain, has been taking alieve w/ minimal relief.

## 2021-03-10 ENCOUNTER — Other Ambulatory Visit: Payer: Self-pay

## 2021-03-10 ENCOUNTER — Emergency Department (HOSPITAL_COMMUNITY): Payer: Self-pay

## 2021-03-10 ENCOUNTER — Encounter (HOSPITAL_COMMUNITY): Payer: Self-pay

## 2021-03-10 ENCOUNTER — Inpatient Hospital Stay (HOSPITAL_COMMUNITY)
Admission: EM | Admit: 2021-03-10 | Discharge: 2021-03-17 | DRG: 637 | Disposition: A | Discharge status: Home / Self Care | Payer: Self-pay | Attending: Emergency Medicine | Admitting: Emergency Medicine

## 2021-03-10 DIAGNOSIS — F1910 Other psychoactive substance abuse, uncomplicated: Secondary | ICD-10-CM

## 2021-03-10 DIAGNOSIS — Z9049 Acquired absence of other specified parts of digestive tract: Secondary | ICD-10-CM

## 2021-03-10 DIAGNOSIS — N179 Acute kidney failure, unspecified: Secondary | ICD-10-CM | POA: Diagnosis present

## 2021-03-10 DIAGNOSIS — G9341 Metabolic encephalopathy: Secondary | ICD-10-CM | POA: Insufficient documentation

## 2021-03-10 DIAGNOSIS — Z888 Allergy status to other drugs, medicaments and biological substances status: Secondary | ICD-10-CM

## 2021-03-10 DIAGNOSIS — F1510 Other stimulant abuse, uncomplicated: Secondary | ICD-10-CM | POA: Insufficient documentation

## 2021-03-10 DIAGNOSIS — R680 Hypothermia, not associated with low environmental temperature: Secondary | ICD-10-CM | POA: Diagnosis present

## 2021-03-10 DIAGNOSIS — E869 Volume depletion, unspecified: Secondary | ICD-10-CM | POA: Diagnosis present

## 2021-03-10 DIAGNOSIS — T68XXXA Hypothermia, initial encounter: Secondary | ICD-10-CM

## 2021-03-10 DIAGNOSIS — T383X6A Underdosing of insulin and oral hypoglycemic [antidiabetic] drugs, initial encounter: Secondary | ICD-10-CM | POA: Diagnosis present

## 2021-03-10 DIAGNOSIS — E1010 Type 1 diabetes mellitus with ketoacidosis without coma: Principal | ICD-10-CM | POA: Diagnosis present

## 2021-03-10 DIAGNOSIS — Z0189 Encounter for other specified special examinations: Secondary | ICD-10-CM

## 2021-03-10 DIAGNOSIS — R410 Disorientation, unspecified: Secondary | ICD-10-CM

## 2021-03-10 DIAGNOSIS — F603 Borderline personality disorder: Secondary | ICD-10-CM | POA: Diagnosis present

## 2021-03-10 DIAGNOSIS — Z781 Physical restraint status: Secondary | ICD-10-CM

## 2021-03-10 DIAGNOSIS — F17220 Nicotine dependence, chewing tobacco, uncomplicated: Secondary | ICD-10-CM | POA: Diagnosis present

## 2021-03-10 DIAGNOSIS — Z794 Long term (current) use of insulin: Secondary | ICD-10-CM

## 2021-03-10 DIAGNOSIS — E10649 Type 1 diabetes mellitus with hypoglycemia without coma: Secondary | ICD-10-CM | POA: Diagnosis not present

## 2021-03-10 DIAGNOSIS — Z79899 Other long term (current) drug therapy: Secondary | ICD-10-CM

## 2021-03-10 DIAGNOSIS — E1110 Type 2 diabetes mellitus with ketoacidosis without coma: Secondary | ICD-10-CM | POA: Diagnosis present

## 2021-03-10 DIAGNOSIS — Z20822 Contact with and (suspected) exposure to covid-19: Secondary | ICD-10-CM | POA: Diagnosis present

## 2021-03-10 DIAGNOSIS — D75839 Thrombocytosis, unspecified: Secondary | ICD-10-CM | POA: Diagnosis present

## 2021-03-10 DIAGNOSIS — F319 Bipolar disorder, unspecified: Secondary | ICD-10-CM | POA: Diagnosis present

## 2021-03-10 DIAGNOSIS — E1011 Type 1 diabetes mellitus with ketoacidosis with coma: Secondary | ICD-10-CM

## 2021-03-10 DIAGNOSIS — R339 Retention of urine, unspecified: Secondary | ICD-10-CM | POA: Diagnosis not present

## 2021-03-10 LAB — BLOOD GAS, VENOUS
Acid-base deficit: 29.4 mmol/L — ABNORMAL HIGH (ref 0.0–2.0)
Acid-base deficit: 29.4 mmol/L — ABNORMAL HIGH (ref 0.0–2.0)
Acid-base deficit: 24.8 mmol/L — ABNORMAL HIGH (ref 0.0–2.0)
Acid-base deficit: 12 mmol/L — ABNORMAL HIGH (ref 0.0–2.0)
Bicarbonate: 3.1 mmol/L — ABNORMAL LOW (ref 20.0–28.0)
Bicarbonate: 3 mmol/L — ABNORMAL LOW (ref 20.0–28.0)
Bicarbonate: 3.6 mmol/L — ABNORMAL LOW (ref 20.0–28.0)
Bicarbonate: 13.1 mmol/L — ABNORMAL LOW (ref 20.0–28.0)
FIO2: 21
FIO2: 21
FIO2: 21
O2 Saturation: 72.3 %
O2 Saturation: 91.7 %
O2 Saturation: 96.8 %
O2 Saturation: 89.3 %
Patient temperature: 98.6
Patient temperature: 98.6
Patient temperature: 37
Patient temperature: 98.6
pCO2, Ven: 15 mmHg — CL (ref 44.0–60.0)
pCO2, Ven: <19 mmHg — CL (ref 44.0–60.0)
pCO2, Ven: <19 mmHg — CL (ref 44.0–60.0)
pCO2, Ven: 27.9 mmHg — ABNORMAL LOW (ref 44.0–60.0)
pH, Ven: 6.952 — CL (ref 7.250–7.430)
pH, Ven: 6.927 — CL (ref 7.250–7.430)
pH, Ven: 7.137 — CL (ref 7.250–7.430)
pH, Ven: 7.292 (ref 7.250–7.430)
pO2, Ven: 47.9 mmHg — ABNORMAL HIGH (ref 32.0–45.0)
pO2, Ven: 78.9 mmHg — ABNORMAL HIGH (ref 32.0–45.0)
pO2, Ven: 101 mmHg — ABNORMAL HIGH (ref 32.0–45.0)
pO2, Ven: 53.2 mmHg — ABNORMAL HIGH (ref 32.0–45.0)

## 2021-03-10 LAB — COMPREHENSIVE METABOLIC PANEL
ALT: 55 U/L — ABNORMAL HIGH (ref 0–44)
AST: 34 U/L (ref 15–41)
Albumin: 3.5 g/dL (ref 3.5–5.0)
Alkaline Phosphatase: 167 U/L — ABNORMAL HIGH (ref 38–126)
BUN: 35 mg/dL — ABNORMAL HIGH (ref 6–20)
CO2: <7 mmol/L — ABNORMAL LOW (ref 22–32)
Calcium: 8.3 mg/dL — ABNORMAL LOW (ref 8.9–10.3)
Chloride: 101 mmol/L (ref 98–111)
Creatinine, Ser: 2.38 mg/dL — ABNORMAL HIGH (ref 0.61–1.24)
GFR, Estimated: 36 mL/min — ABNORMAL LOW (ref >60)
Glucose, Bld: 548 mg/dL (ref 70–99)
Potassium: 5.7 mmol/L — ABNORMAL HIGH (ref 3.5–5.1)
Sodium: 134 mmol/L — ABNORMAL LOW (ref 135–145)
Total Bilirubin: 1.9 mg/dL — ABNORMAL HIGH (ref 0.3–1.2)
Total Protein: 7.6 g/dL (ref 6.5–8.1)

## 2021-03-10 LAB — URINALYSIS, ROUTINE W REFLEX MICROSCOPIC
Bilirubin Urine: NEGATIVE
Glucose, UA: >=500 mg/dL — AB
Ketones, ur: 80 mg/dL — AB
Leukocytes,Ua: NEGATIVE
Nitrite: NEGATIVE
Protein, ur: >=300 mg/dL — AB
Specific Gravity, Urine: 1.012 (ref 1.005–1.030)
pH: 5 (ref 5.0–8.0)

## 2021-03-10 LAB — BASIC METABOLIC PANEL
Anion gap: 17 — ABNORMAL HIGH (ref 5–15)
Anion gap: 12 (ref 5–15)
Anion gap: 12 (ref 5–15)
BUN: 35 mg/dL — ABNORMAL HIGH (ref 6–20)
BUN: 33 mg/dL — ABNORMAL HIGH (ref 6–20)
BUN: 29 mg/dL — ABNORMAL HIGH (ref 6–20)
BUN: 28 mg/dL — ABNORMAL HIGH (ref 6–20)
BUN: 27 mg/dL — ABNORMAL HIGH (ref 6–20)
CO2: <7 mmol/L — ABNORMAL LOW (ref 22–32)
CO2: <7 mmol/L — ABNORMAL LOW (ref 22–32)
CO2: 8 mmol/L — ABNORMAL LOW (ref 22–32)
CO2: 13 mmol/L — ABNORMAL LOW (ref 22–32)
CO2: 14 mmol/L — ABNORMAL LOW (ref 22–32)
Calcium: 7.6 mg/dL — ABNORMAL LOW (ref 8.9–10.3)
Calcium: 7.1 mg/dL — ABNORMAL LOW (ref 8.9–10.3)
Calcium: 7.1 mg/dL — ABNORMAL LOW (ref 8.9–10.3)
Calcium: 7.1 mg/dL — ABNORMAL LOW (ref 8.9–10.3)
Calcium: 7.1 mg/dL — ABNORMAL LOW (ref 8.9–10.3)
Chloride: 110 mmol/L (ref 98–111)
Chloride: 117 mmol/L — ABNORMAL HIGH (ref 98–111)
Chloride: 117 mmol/L — ABNORMAL HIGH (ref 98–111)
Chloride: 116 mmol/L — ABNORMAL HIGH (ref 98–111)
Chloride: 116 mmol/L — ABNORMAL HIGH (ref 98–111)
Creatinine, Ser: 2.22 mg/dL — ABNORMAL HIGH (ref 0.61–1.24)
Creatinine, Ser: 2.05 mg/dL — ABNORMAL HIGH (ref 0.61–1.24)
Creatinine, Ser: 1.91 mg/dL — ABNORMAL HIGH (ref 0.61–1.24)
Creatinine, Ser: 1.73 mg/dL — ABNORMAL HIGH (ref 0.61–1.24)
Creatinine, Ser: 1.72 mg/dL — ABNORMAL HIGH (ref 0.61–1.24)
GFR, Estimated: 39 mL/min — ABNORMAL LOW (ref >60)
GFR, Estimated: 43 mL/min — ABNORMAL LOW (ref >60)
GFR, Estimated: 46 mL/min — ABNORMAL LOW (ref >60)
GFR, Estimated: 52 mL/min — ABNORMAL LOW (ref >60)
GFR, Estimated: 53 mL/min — ABNORMAL LOW (ref >60)
Glucose, Bld: 284 mg/dL — ABNORMAL HIGH (ref 70–99)
Glucose, Bld: 175 mg/dL — ABNORMAL HIGH (ref 70–99)
Glucose, Bld: 167 mg/dL — ABNORMAL HIGH (ref 70–99)
Glucose, Bld: 159 mg/dL — ABNORMAL HIGH (ref 70–99)
Glucose, Bld: 204 mg/dL — ABNORMAL HIGH (ref 70–99)
Potassium: 4.5 mmol/L (ref 3.5–5.1)
Potassium: 4.2 mmol/L (ref 3.5–5.1)
Potassium: 3.9 mmol/L (ref 3.5–5.1)
Potassium: 3.7 mmol/L (ref 3.5–5.1)
Potassium: 3.4 mmol/L — ABNORMAL LOW (ref 3.5–5.1)
Sodium: 139 mmol/L (ref 135–145)
Sodium: 142 mmol/L (ref 135–145)
Sodium: 142 mmol/L (ref 135–145)
Sodium: 141 mmol/L (ref 135–145)
Sodium: 142 mmol/L (ref 135–145)

## 2021-03-10 LAB — GLUCOSE, CAPILLARY
Glucose-Capillary: 181 mg/dL — ABNORMAL HIGH (ref 70–99)
Glucose-Capillary: 123 mg/dL — ABNORMAL HIGH (ref 70–99)
Glucose-Capillary: 146 mg/dL — ABNORMAL HIGH (ref 70–99)
Glucose-Capillary: 169 mg/dL — ABNORMAL HIGH (ref 70–99)
Glucose-Capillary: 147 mg/dL — ABNORMAL HIGH (ref 70–99)
Glucose-Capillary: 158 mg/dL — ABNORMAL HIGH (ref 70–99)
Glucose-Capillary: 155 mg/dL — ABNORMAL HIGH (ref 70–99)
Glucose-Capillary: 133 mg/dL — ABNORMAL HIGH (ref 70–99)
Glucose-Capillary: 149 mg/dL — ABNORMAL HIGH (ref 70–99)
Glucose-Capillary: 203 mg/dL — ABNORMAL HIGH (ref 70–99)
Glucose-Capillary: 174 mg/dL — ABNORMAL HIGH (ref 70–99)
Glucose-Capillary: 168 mg/dL — ABNORMAL HIGH (ref 70–99)

## 2021-03-10 LAB — RAPID URINE DRUG SCREEN, HOSP PERFORMED
Amphetamines: POSITIVE — AB
Barbiturates: NOT DETECTED
Benzodiazepines: NOT DETECTED
Cocaine: NOT DETECTED
Opiates: NOT DETECTED
Tetrahydrocannabinol: NOT DETECTED

## 2021-03-10 LAB — CBC WITH DIFFERENTIAL/PLATELET
Abs Immature Granulocytes: 0.16 10*3/uL — ABNORMAL HIGH (ref 0.00–0.07)
Basophils Absolute: 0.1 10*3/uL (ref 0.0–0.1)
Basophils Relative: 1 %
Eosinophils Absolute: 0 10*3/uL (ref 0.0–0.5)
Eosinophils Relative: 0 %
HCT: 44.8 % (ref 39.0–52.0)
Hemoglobin: 13.9 g/dL (ref 13.0–17.0)
Immature Granulocytes: 1 %
Lymphocytes Relative: 5 %
Lymphs Abs: 0.7 10*3/uL (ref 0.7–4.0)
MCH: 29.4 pg (ref 26.0–34.0)
MCHC: 31 g/dL (ref 30.0–36.0)
MCV: 94.7 fL (ref 80.0–100.0)
Monocytes Absolute: 0.7 10*3/uL (ref 0.1–1.0)
Monocytes Relative: 4 %
Neutro Abs: 14.7 10*3/uL — ABNORMAL HIGH (ref 1.7–7.7)
Neutrophils Relative %: 89 %
Platelets: 530 10*3/uL — ABNORMAL HIGH (ref 150–400)
RBC: 4.73 MIL/uL (ref 4.22–5.81)
RDW: 12.8 % (ref 11.5–15.5)
WBC: 16.3 10*3/uL — ABNORMAL HIGH (ref 4.0–10.5)
nRBC: 0 % (ref 0.0–0.2)

## 2021-03-10 LAB — BLOOD GAS, ARTERIAL
Acid-base deficit: 25.8 mmol/L — ABNORMAL HIGH (ref 0.0–2.0)
Acid-base deficit: 7.8 mmol/L — ABNORMAL HIGH (ref 0.0–2.0)
Bicarbonate: 3.6 mmol/L — ABNORMAL LOW (ref 20.0–28.0)
Bicarbonate: 16.5 mmol/L — ABNORMAL LOW (ref 20.0–28.0)
Drawn by: 29503
O2 Content: 2 L/min
O2 Saturation: 84.6 %
O2 Saturation: 98.4 %
Patient temperature: 96.3
Patient temperature: 35.1
pCO2 arterial: <19 mmHg — CL (ref 32.0–48.0)
pCO2 arterial: 28.3 mmHg — ABNORMAL LOW (ref 32.0–48.0)
pH, Arterial: 7.083 — CL (ref 7.350–7.450)
pH, Arterial: 7.371 (ref 7.350–7.450)
pO2, Arterial: 47.1 mmHg — ABNORMAL LOW (ref 83.0–108.0)
pO2, Arterial: 94.5 mmHg (ref 83.0–108.0)

## 2021-03-10 LAB — CBG MONITORING, ED
Glucose-Capillary: 443 mg/dL — ABNORMAL HIGH (ref 70–99)
Glucose-Capillary: 477 mg/dL — ABNORMAL HIGH (ref 70–99)
Glucose-Capillary: 530 mg/dL (ref 70–99)
Glucose-Capillary: 383 mg/dL — ABNORMAL HIGH (ref 70–99)
Glucose-Capillary: 455 mg/dL — ABNORMAL HIGH (ref 70–99)
Glucose-Capillary: 275 mg/dL — ABNORMAL HIGH (ref 70–99)
Glucose-Capillary: 263 mg/dL — ABNORMAL HIGH (ref 70–99)
Glucose-Capillary: 161 mg/dL — ABNORMAL HIGH (ref 70–99)
Glucose-Capillary: 135 mg/dL — ABNORMAL HIGH (ref 70–99)
Glucose-Capillary: 155 mg/dL — ABNORMAL HIGH (ref 70–99)

## 2021-03-10 LAB — APTT: aPTT: 25 seconds (ref 24–36)

## 2021-03-10 LAB — PROTIME-INR
INR: 1 (ref 0.8–1.2)
Prothrombin Time: 13.5 seconds (ref 11.4–15.2)

## 2021-03-10 LAB — HEMOGLOBIN A1C
Hgb A1c MFr Bld: 10.8 % — ABNORMAL HIGH (ref 4.8–5.6)
Mean Plasma Glucose: 263.26 mg/dL

## 2021-03-10 LAB — HIV ANTIBODY (ROUTINE TESTING W REFLEX): HIV Screen 4th Generation wRfx: NONREACTIVE

## 2021-03-10 LAB — BETA-HYDROXYBUTYRIC ACID: Beta-Hydroxybutyric Acid: >8 mmol/L — ABNORMAL HIGH (ref 0.05–0.27)

## 2021-03-10 LAB — MRSA NEXT GEN BY PCR, NASAL: MRSA by PCR Next Gen: DETECTED — AB

## 2021-03-10 LAB — RESP PANEL BY RT-PCR (FLU A&B, COVID) ARPGX2
Influenza A by PCR: NEGATIVE
Influenza B by PCR: NEGATIVE
SARS Coronavirus 2 by RT PCR: NEGATIVE

## 2021-03-10 LAB — LACTIC ACID, PLASMA
Lactic Acid, Venous: 1.1 mmol/L (ref 0.5–1.9)
Lactic Acid, Venous: 1 mmol/L (ref 0.5–1.9)

## 2021-03-10 MED ORDER — VANCOMYCIN HCL 2000 MG/400ML IV SOLN
2000.0000 mg | Freq: Once | INTRAVENOUS | Status: DC
  Administered 2021-03-10: 2000 mg via INTRAVENOUS
  Filled 2021-03-10: qty 400

## 2021-03-10 MED ORDER — MUPIROCIN 2 % EX OINT
1.0000 "application " | TOPICAL_OINTMENT | Freq: Two times a day (BID) | CUTANEOUS | Status: AC
  Administered 2021-03-10 – 2021-03-14 (×10): 1 via NASAL
  Filled 2021-03-10 (×2): qty 22

## 2021-03-10 MED ORDER — FOLIC ACID 1 MG PO TABS: 1.0000 mg | ORAL_TABLET | Freq: Every day | ORAL | Status: DC

## 2021-03-10 MED ORDER — DEXTROSE 50 % IV SOLN
0.0000 mL | INTRAVENOUS | Status: DC | PRN
  Administered 2021-03-12 – 2021-03-13 (×2): 50 mL via INTRAVENOUS
  Filled 2021-03-10 (×4): qty 50

## 2021-03-10 MED ORDER — LORAZEPAM 2 MG/ML IJ SOLN
1.0000 mg | Freq: Once | INTRAMUSCULAR | Status: AC
  Administered 2021-03-10: 1 mg via INTRAVENOUS
  Filled 2021-03-10: qty 1

## 2021-03-10 MED ORDER — DEXTROSE IN LACTATED RINGERS 5 % IV SOLN: INTRAVENOUS | Status: DC

## 2021-03-10 MED ORDER — CHLORHEXIDINE GLUCONATE 0.12 % MT SOLN
15.0000 mL | Freq: Two times a day (BID) | OROMUCOSAL | Status: DC
  Administered 2021-03-10 – 2021-03-12 (×5): 15 mL via OROMUCOSAL
  Administered 2021-03-13: 2 mL via OROMUCOSAL
  Administered 2021-03-13 – 2021-03-17 (×6): 15 mL via OROMUCOSAL
  Filled 2021-03-10 (×13): qty 15

## 2021-03-10 MED ORDER — SODIUM CHLORIDE 0.9 % IV SOLN: 0.4000 ug/kg/h | INTRAVENOUS | Status: DC

## 2021-03-10 MED ORDER — VANCOMYCIN HCL 1250 MG/250ML IV SOLN: 1250.0000 mg | INTRAVENOUS | Status: DC

## 2021-03-10 MED ORDER — SODIUM CHLORIDE 0.9 % IV BOLUS
1000.0000 mL | Freq: Once | INTRAVENOUS | Status: AC
  Administered 2021-03-10: 1000 mL via INTRAVENOUS

## 2021-03-10 MED ORDER — LORAZEPAM 2 MG/ML IJ SOLN
1.0000 mg | INTRAMUSCULAR | Status: AC | PRN
  Administered 2021-03-10: 4 mg via INTRAVENOUS
  Administered 2021-03-11 (×2): 2 mg via INTRAVENOUS
  Administered 2021-03-11 (×2): 3 mg via INTRAVENOUS
  Administered 2021-03-11: 09:00:00 2 mg via INTRAVENOUS
  Administered 2021-03-11 – 2021-03-12 (×5): 3 mg via INTRAVENOUS
  Administered 2021-03-12: 2 mg via INTRAVENOUS
  Administered 2021-03-13: 3 mg via INTRAVENOUS
  Administered 2021-03-13: 1 mg via INTRAVENOUS
  Filled 2021-03-10: qty 2
  Filled 2021-03-10 (×2): qty 1
  Filled 2021-03-10: qty 2
  Filled 2021-03-10: qty 1
  Filled 2021-03-10: qty 2
  Filled 2021-03-10 (×3): qty 1
  Filled 2021-03-10: qty 2
  Filled 2021-03-10 (×2): qty 1
  Filled 2021-03-10 (×2): qty 2
  Filled 2021-03-10: qty 1

## 2021-03-10 MED ORDER — LORAZEPAM 2 MG/ML IJ SOLN
2.0000 mg | Freq: Once | INTRAMUSCULAR | Status: AC
  Administered 2021-03-10: 2 mg via INTRAVENOUS
  Filled 2021-03-10: qty 1

## 2021-03-10 MED ORDER — SODIUM CHLORIDE 0.9 % IV SOLN: 250.0000 mL | INTRAVENOUS | Status: DC

## 2021-03-10 MED ORDER — LACTATED RINGERS IV SOLN: INTRAVENOUS | Status: DC

## 2021-03-10 MED ORDER — HALOPERIDOL LACTATE 5 MG/ML IJ SOLN: 5.0000 mg | Freq: Four times a day (QID) | INTRAMUSCULAR | Status: DC | PRN

## 2021-03-10 MED ORDER — LORAZEPAM 2 MG/ML IJ SOLN: 0.0000 mg | INTRAMUSCULAR | Status: DC

## 2021-03-10 MED ORDER — DEXTROSE 50 % IV SOLN: 0.0000 mL | INTRAVENOUS | Status: DC | PRN

## 2021-03-10 MED ORDER — INSULIN REGULAR(HUMAN) IN NACL 100-0.9 UT/100ML-% IV SOLN
INTRAVENOUS | Status: DC
  Administered 2021-03-10: 4.6 [IU]/h via INTRAVENOUS
  Administered 2021-03-10: 0.6 [IU]/h via INTRAVENOUS
  Administered 2021-03-10: 0.9 [IU]/h via INTRAVENOUS
  Filled 2021-03-10: qty 100

## 2021-03-10 MED ORDER — INSULIN REGULAR(HUMAN) IN NACL 100-0.9 UT/100ML-% IV SOLN
INTRAVENOUS | Status: DC
  Administered 2021-03-10: 8.5 [IU]/h via INTRAVENOUS
  Filled 2021-03-10: qty 100

## 2021-03-10 MED ORDER — NOREPINEPHRINE 4 MG/250ML-% IV SOLN: 2.0000 ug/min | INTRAVENOUS | Status: DC

## 2021-03-10 MED ORDER — DEXMEDETOMIDINE HCL IN NACL 200 MCG/50ML IV SOLN
0.4000 ug/kg/h | INTRAVENOUS | Status: DC
  Administered 2021-03-10: 0.5 ug/kg/h via INTRAVENOUS
  Administered 2021-03-10: 1.2 ug/kg/h via INTRAVENOUS
  Administered 2021-03-10: 0.4 ug/kg/h via INTRAVENOUS
  Filled 2021-03-10: qty 50
  Filled 2021-03-10: qty 100
  Filled 2021-03-10: qty 50

## 2021-03-10 MED ORDER — SODIUM CHLORIDE 0.9 % IV SOLN
2.0000 g | Freq: Once | INTRAVENOUS | Status: AC
  Administered 2021-03-10: 2 g via INTRAVENOUS
  Filled 2021-03-10: qty 2

## 2021-03-10 MED ORDER — LORAZEPAM 1 MG PO TABS
1.0000 mg | ORAL_TABLET | ORAL | Status: AC | PRN
  Filled 2021-03-10: qty 2

## 2021-03-10 MED ORDER — MIDAZOLAM HCL 2 MG/2ML IJ SOLN
2.0000 mg | Freq: Once | INTRAMUSCULAR | Status: AC
  Administered 2021-03-10: 2 mg via INTRAVENOUS

## 2021-03-10 MED ORDER — DEXMEDETOMIDINE HCL IN NACL 400 MCG/100ML IV SOLN
0.4000 ug/kg/h | INTRAVENOUS | Status: DC
  Administered 2021-03-10 (×2): 1.2 ug/kg/h via INTRAVENOUS
  Administered 2021-03-11: 03:00:00 0.3 ug/kg/h via INTRAVENOUS
  Administered 2021-03-12: 0.6 ug/kg/h via INTRAVENOUS
  Administered 2021-03-13: 1.1 ug/kg/h via INTRAVENOUS
  Administered 2021-03-13: 0.4 ug/kg/h via INTRAVENOUS
  Administered 2021-03-14: 1.2 ug/kg/h via INTRAVENOUS
  Administered 2021-03-14: 1.1 ug/kg/h via INTRAVENOUS
  Administered 2021-03-14: 1.2 ug/kg/h via INTRAVENOUS
  Administered 2021-03-14 (×2): 1.1 ug/kg/h via INTRAVENOUS
  Administered 2021-03-15: 1.2 ug/kg/h via INTRAVENOUS
  Administered 2021-03-15: 14:00:00 0.6 ug/kg/h via INTRAVENOUS
  Administered 2021-03-15: 23:00:00 1.1 ug/kg/h via INTRAVENOUS
  Administered 2021-03-15: 05:00:00 1.2 ug/kg/h via INTRAVENOUS
  Administered 2021-03-16: 20:00:00 0.3 ug/kg/h via INTRAVENOUS
  Administered 2021-03-16: 08:00:00 1.1 ug/kg/h via INTRAVENOUS
  Administered 2021-03-16: 04:00:00 1.2 ug/kg/h via INTRAVENOUS
  Filled 2021-03-10 (×3): qty 100
  Filled 2021-03-10: qty 200
  Filled 2021-03-10 (×20): qty 100

## 2021-03-10 MED ORDER — CHLORHEXIDINE GLUCONATE CLOTH 2 % EX PADS
6.0000 | MEDICATED_PAD | Freq: Every day | CUTANEOUS | Status: DC
  Administered 2021-03-10 – 2021-03-17 (×8): 6 via TOPICAL

## 2021-03-10 MED ORDER — OLANZAPINE 10 MG IM SOLR
10.0000 mg | Freq: Once | INTRAMUSCULAR | Status: DC
  Filled 2021-03-10: qty 10

## 2021-03-10 MED ORDER — LORAZEPAM 2 MG/ML IJ SOLN: 0.0000 mg | Freq: Three times a day (TID) | INTRAMUSCULAR | Status: DC

## 2021-03-10 MED ORDER — LACTATED RINGERS IV BOLUS
1000.0000 mL | Freq: Once | INTRAVENOUS | Status: AC
  Administered 2021-03-10: 1000 mL via INTRAVENOUS

## 2021-03-10 MED ORDER — THIAMINE HCL 100 MG PO TABS: 100.0000 mg | ORAL_TABLET | Freq: Every day | ORAL | Status: DC

## 2021-03-10 MED ORDER — LORAZEPAM 2 MG/ML IJ SOLN
2.0000 mg | INTRAMUSCULAR | Status: DC
  Administered 2021-03-10 (×2): 2 mg via INTRAVENOUS
  Filled 2021-03-10 (×2): qty 1

## 2021-03-10 MED ORDER — VANCOMYCIN HCL IN DEXTROSE 1-5 GM/200ML-% IV SOLN
1000.0000 mg | Freq: Once | INTRAVENOUS | Status: DC
  Filled 2021-03-10: qty 200

## 2021-03-10 MED ORDER — SODIUM CHLORIDE 0.9 % IV SOLN
0.4000 ug/kg/h | INTRAVENOUS | Status: DC
  Filled 2021-03-10: qty 4

## 2021-03-10 MED ORDER — NOREPINEPHRINE 4 MG/250ML-% IV SOLN
2.0000 ug/min | INTRAVENOUS | Status: DC
  Administered 2021-03-10: 2 ug/min via INTRAVENOUS
  Filled 2021-03-10: qty 250

## 2021-03-10 MED ORDER — STERILE WATER FOR INJECTION IV SOLN
INTRAVENOUS | Status: DC
  Filled 2021-03-10 (×3): qty 150
  Filled 2021-03-10: qty 1000

## 2021-03-10 MED ORDER — ENOXAPARIN SODIUM 40 MG/0.4ML IJ SOSY
40.0000 mg | PREFILLED_SYRINGE | INTRAMUSCULAR | Status: DC
  Administered 2021-03-10 – 2021-03-17 (×8): 40 mg via SUBCUTANEOUS
  Filled 2021-03-10 (×8): qty 0.4

## 2021-03-10 MED ORDER — METRONIDAZOLE 500 MG/100ML IV SOLN
500.0000 mg | Freq: Once | INTRAVENOUS | Status: DC
  Administered 2021-03-10: 500 mg via INTRAVENOUS
  Filled 2021-03-10: qty 100

## 2021-03-10 MED ORDER — SODIUM CHLORIDE 0.9 % IV SOLN
2.0000 g | Freq: Two times a day (BID) | INTRAVENOUS | Status: DC
  Administered 2021-03-10 – 2021-03-11 (×2): 2 g via INTRAVENOUS
  Filled 2021-03-10 (×2): qty 2

## 2021-03-10 MED ORDER — ADULT MULTIVITAMIN W/MINERALS CH: 1.0000 | ORAL_TABLET | Freq: Every day | ORAL | Status: DC

## 2021-03-10 MED ORDER — THIAMINE HCL 100 MG/ML IJ SOLN
100.0000 mg | Freq: Every day | INTRAMUSCULAR | Status: DC
  Administered 2021-03-10 – 2021-03-12 (×3): 100 mg via INTRAVENOUS
  Filled 2021-03-10 (×3): qty 2

## 2021-03-10 MED ORDER — LACTATED RINGERS IV BOLUS
1600.0000 mL | Freq: Once | INTRAVENOUS | Status: AC
  Administered 2021-03-10: 1600 mL via INTRAVENOUS

## 2021-03-10 MED ORDER — HALOPERIDOL LACTATE 5 MG/ML IJ SOLN
5.0000 mg | Freq: Four times a day (QID) | INTRAMUSCULAR | Status: DC
  Administered 2021-03-10: 5 mg via INTRAVENOUS
  Filled 2021-03-10 (×3): qty 1

## 2021-03-10 MED ORDER — OLANZAPINE 10 MG IM SOLR
5.0000 mg | Freq: Once | INTRAMUSCULAR | Status: AC
  Administered 2021-03-10: 5 mg via INTRAMUSCULAR
  Filled 2021-03-10: qty 10

## 2021-03-10 MED ORDER — SODIUM CHLORIDE 0.9 % IV SOLN: INTRAVENOUS | Status: DC

## 2021-03-10 MED ORDER — LORAZEPAM 2 MG/ML IJ SOLN
INTRAMUSCULAR | Status: AC
  Administered 2021-03-10: 2 mg
  Filled 2021-03-10: qty 4

## 2021-03-10 MED ORDER — ORAL CARE MOUTH RINSE
15.0000 mL | Freq: Two times a day (BID) | OROMUCOSAL | Status: DC
  Administered 2021-03-11 – 2021-03-17 (×11): 15 mL via OROMUCOSAL

## 2021-03-10 NOTE — Sepsis Progress Note (Signed)
Following per sepsis protocol   

## 2021-03-10 NOTE — Progress Notes (Signed)
PCCM INTERVAL PROGRESS NOTE   Delirium worsening. Patient combative with staff and belligerent despite multiple doses of ativan and Precedex infusing.   Additional Ativan now Zyprexa now  Will schedule ativan every 4 hours x 4 doses Ativan CIWA PRN PRN haldol with QTC parameter.   Hopeful we can avoid intubation   Joneen Roach, AGACNP-BC [facility]  See Amion for personal pager PCCM on call pager [phone] until 7pm. Please call Elink 7p-7a. [phone]  03/10/2021 10:47 AM

## 2021-03-10 NOTE — ED Provider Notes (Signed)
[facility]-EMERGENCY DEPT Provider Note   CSN: [id number] Arrival date & time: 03/10/21  0217     History Chief Complaint  Patient presents with   Hyperglycemia    Raymond [name] is a 35 y.o. male with a hx of T1DM, borderline personality disorder, depression, and prior substance use who presents to the ED via EMS for hyperglycemia.  Per EMS to triage RN patient has been out of his insulin for the past 1.5 days was found to have high blood sugar and brought to the ED. on my evaluation the patient he is moaning, uncomfortable, states he has pain all over and has been vomiting.  He does not answer further questions.  Level 5 caveat applies secondary to altered mental status. HPI     Past Medical History:  Diagnosis Date   Borderline personality disorder (HCC)    Diabetes mellitus without complication (HCC)    Type 1   Gallstones     Patient Active Problem List   Diagnosis Date Noted   Hypoglycemia due to type 1 diabetes mellitus (HCC) 10/06/2018   DKA (diabetic ketoacidoses) 10/04/2018   MDD (major depressive disorder), recurrent severe, without psychosis (HCC) 12/27/2016   Episodic substance abuse 07/23/2014   Severe recurrent major depression without psychotic features (HCC) 07/22/2014   Suicide attempt [facility])     Past Surgical History:  Procedure Laterality Date   CHOLECYSTECTOMY         History reviewed. No pertinent family history.  Social History   Tobacco Use   Smoking status: Never   Smokeless tobacco: Current    Types: Chew  Vaping Use   Vaping Use: Never used  Substance Use Topics   Alcohol use: Yes   Drug use: Yes    Types: Other-see comments, Methamphetamines    Comment: OTC cough medicine    Home Medications Prior to Admission medications   Medication Sig Start Date End Date Taking? Authorizing Provider  amoxicillin-clavulanate (AUGMENTIN) 875-125 MG tablet Take 1 tablet by mouth 2 (two) times daily. 02/04/21   Linwood Dibbles, MD  atorvastatin (LIPITOR) 10 MG tablet Take 10 mg by mouth at bedtime. 11/30/19   [provider]  doxycycline (VIBRAMYCIN) 100 MG capsule Take 1 capsule (100 mg total) by mouth 2 (two) times daily. One po bid x 7 days 10/23/20   Arthor Captain, PA-C  lisinopril (ZESTRIL) 10 MG tablet Take 1 tablet (10 mg total) by mouth daily. 02/04/21 03/06/21  Linwood Dibbles, MD  NOVOLIN 70/30 RELION (70-30) 100 UNIT/ML injection Inject 35 Units into the skin 2 (two) times a day. 07/06/18   [provider]  permethrin (ELIMITE) 5 % cream Apply from head to toe, Leave on for 10 hours then wash off.  Repeat once in 10 days 11/21/20   Cristina Gong, PA-C    Allergies    Ibuprofen and Tramadol  Review of Systems   Review of Systems  Unable to perform ROS: Mental status change   Physical Exam Updated Vital Signs BP (!) 174/111   Pulse (!) 106   Resp (!) 28   SpO2 100%   Physical Exam Vitals and nursing note reviewed.  Constitutional:      General: He is in acute distress.     Appearance: He is ill-appearing.     Comments: Awake, moaning.  HENT:     Head: Normocephalic and atraumatic.     Mouth/Throat:     Mouth: Mucous membranes are dry.  Eyes:  Pupils: Pupils are equal, round, and reactive to light.  Cardiovascular:     Rate and Rhythm: Regular rhythm. Tachycardia present.  Pulmonary:     Breath sounds: No wheezing or rales.     Comments: Tachypneic.  Kussmaul respirations. Abdominal:     General: There is no distension.     Palpations: Abdomen is soft.     Tenderness: There is no abdominal tenderness. There is no guarding or rebound.  Musculoskeletal:     Cervical back: Neck supple. No rigidity.     Right lower leg: No edema.     Left lower leg: No edema.  Skin:    General: Skin is dry.     Comments: Scabs noted to the lower extremities  Neurological:     GCS: GCS eye subscore is 4. GCS verbal subscore is 4. GCS motor subscore is 5.     Comments: Moving all  extremities.   Psychiatric:        Behavior: Behavior is agitated and combative.    ED Results / Procedures / Treatments   Labs (all labs ordered are listed, but only abnormal results are displayed) Labs Reviewed  CBC WITH DIFFERENTIAL/PLATELET - Abnormal; Notable for the following components:      Result Value   WBC 16.3 (*)    Platelets 530 (*)    Neutro Abs 14.7 (*)    Abs Immature Granulocytes 0.16 (*)    All other components within normal limits  COMPREHENSIVE METABOLIC PANEL - Abnormal; Notable for the following components:   Sodium 134 (*)    Potassium 5.7 (*)    CO2 <7 (*)    Glucose, Bld 548 (*)    BUN 35 (*)    Creatinine, Ser 2.38 (*)    Calcium 8.3 (*)    ALT 55 (*)    Alkaline Phosphatase 167 (*)    Total Bilirubin 1.9 (*)    GFR, Estimated 36 (*)    All other components within normal limits  URINALYSIS, ROUTINE W REFLEX MICROSCOPIC - Abnormal; Notable for the following components:   Color, Urine STRAW (*)    Glucose, UA >=500 (*)    Hgb urine dipstick MODERATE (*)    Ketones, ur 80 (*)    Protein, ur >=300 (*)    Bacteria, UA RARE (*)    All other components within normal limits  BLOOD GAS, VENOUS - Abnormal; Notable for the following components:   pH, Ven 6.952 (*)    pCO2, Ven 15.0 (*)    pO2, Ven 47.9 (*)    Bicarbonate 3.1 (*)    Acid-base deficit 29.4 (*)    All other components within normal limits  BETA-HYDROXYBUTYRIC ACID - Abnormal; Notable for the following components:   Beta-Hydroxybutyric Acid >8.00 (*)    All other components within normal limits  RAPID URINE DRUG SCREEN, HOSP PERFORMED - Abnormal; Notable for the following components:   Amphetamines POSITIVE (*)    All other components within normal limits  BLOOD GAS, VENOUS - Abnormal; Notable for the following components:   pH, Ven 6.927 (*)    pCO2, Ven <19.0 (*)    pO2, Ven 78.9 (*)    Bicarbonate 3.0 (*)    Acid-base deficit 29.4 (*)    All other components within normal limits   CBG MONITORING, ED - Abnormal; Notable for the following components:   Glucose-Capillary 443 (*)    All other components within normal limits  CBG MONITORING, ED - Abnormal; Notable for the  following components:   Glucose-Capillary 530 (*)    All other components within normal limits  CBG MONITORING, ED - Abnormal; Notable for the following components:   Glucose-Capillary 477 (*)    All other components within normal limits  CBG MONITORING, ED - Abnormal; Notable for the following components:   Glucose-Capillary 455 (*)    All other components within normal limits  CBG MONITORING, ED - Abnormal; Notable for the following components:   Glucose-Capillary 383 (*)    All other components within normal limits  CBG MONITORING, ED - Abnormal; Notable for the following components:   Glucose-Capillary 275 (*)    All other components within normal limits  CBG MONITORING, ED - Abnormal; Notable for the following components:   Glucose-Capillary 263 (*)    All other components within normal limits  RESP PANEL BY RT-PCR (FLU A&B, COVID) ARPGX2  CULTURE, BLOOD (ROUTINE X 2)  CULTURE, BLOOD (ROUTINE X 2)  LACTIC ACID, PLASMA  LACTIC ACID, PLASMA  PROTIME-INR  APTT  HEMOGLOBIN [id number]  BASIC METABOLIC PANEL  BASIC METABOLIC PANEL  BASIC METABOLIC PANEL  BASIC METABOLIC PANEL  BASIC METABOLIC PANEL  BLOOD GAS, ARTERIAL  HIV ANTIBODY (ROUTINE TESTING W REFLEX)  I-STAT CHEM 8, ED    EKG None  Radiology DG Chest Portable 1 View  Result Date: 03/10/2021 CLINICAL DATA:  Cough with hyperglycemia. EXAM: PORTABLE CHEST 1 VIEW COMPARISON:  Portable chest 10/04/2018. FINDINGS: The heart size and mediastinal contours are within normal limits. Both lungs are clear. The visualized skeletal structures are unremarkable. IMPRESSION: No active disease or interval changes. Electronically Signed   By: Telford Nab M.D.   On: 03/10/2021 02:58    Procedures .Critical Care Performed by: Amaryllis Dyke, PA-C Authorized by: Amaryllis Dyke, PA-C    CRITICAL CARE Performed by: Kennith Maes   Total critical care time: 50 minutes  Critical care time was exclusive of separately billable procedures and treating other patients.  Critical care was necessary to treat or prevent imminent or life-threatening deterioration.  Critical care was time spent personally by me on the following activities: development of treatment plan with patient and/or surrogate as well as nursing, discussions with consultants, evaluation of patient's response to treatment, examination of patient, obtaining history from patient or surrogate, ordering and performing treatments and interventions, ordering and review of laboratory studies, ordering and review of radiographic studies, pulse oximetry and re-evaluation of patient's condition.  Medications Ordered in ED Medications  sodium chloride 0.9 % bolus 1,000 mL (1,000 mLs Intravenous New Bag/Given 03/10/21 0247)    ED Course  I have reviewed the triage vital signs and the nursing notes.  Pertinent labs & imaging results that were available during my care of the patient were reviewed by me and considered in my medical decision making (see chart for details).    MDM Rules/Calculators/A&P                           Patient presents to the ED for hyperglycemia.  On arrival he seems altered, he is moaning, tachypneic, and tachycardic.  Primary concern for DKA.  Fluids initiated.   Additional history obtained:  Additional history obtained from chart review & nursing note review.   Lab Tests:  I Ordered, reviewed, and interpreted labs, which included:  CBC: Leukocytosis and thrombocytosis CMP: Hyperglycemia with acidosis and anion gap elevation at approximately 30.AKI noted.   VBG: Metabolic acidosis with pH of 6.952, CO2  15, and bicarb of 3.1. COVID/influenza: Negative . Imaging Studies ordered:  I ordered imaging studies which included  chest x-ray, I independently reviewed, formal radiology impression shows: No active disease or interval changes.   ED Course:  03:00: Patient not staying in the bed, feeling very nauseous and anxious, ativan ordered.   03:27: Patient with increasing agitation, getting physically aggressive with nursing staff & throwing things, will not stay in bed or allow IV access establishment, will not allow for temperature to be taken, additional ativan ordered as well as violent restraints. Insulin drip ordered- potassium pending, will adjust medication as needed.   Patient in DKA with glucose of 548, bicarb of 3.1, CO2 15, and pH of 6.952, findings consistent with a metabolic encephalopathy at this time requiring Ativan and restraints.  Suspect that his cause of DKA is his lack of insulin over the past 1.5 days.  He also has an acute kidney injury.  Fluid boluses and insulin drip initiated in the ED. Somewhat hyperkalemic on initial labs therefore no supplemental potassium ordered at this time- repeat BMP ordered for continued trending.  Given patient's degree of acidosis with altered mental status  will discuss with critical care service for possible ICU admission. On re-assessment he is sleeping post ativan, responsive to painful stimuli, tachypneic, SpO2 100% on RA.   Discussed with intensivist Dr. Duwayne Heck- plan for repeat VBG and call back to determine disposition.   VBG with worsening pH, bicarb similar, CO2 < 19. While patient remains responsive to painful stimuli and with mild tachypnea question if there is an issue of tidal volume.  05:58: CONSULT: Re-discussed with Dr. Duwayne Heck- plan for additional fluids, will put orders in, ICU team to see. Additionally discussed patient's hypothermia noted after ativan/restraints and able to obtain rectal temp, considered sepsis as well as primarily DKA induced- plan for blood cultures, lactic, and initiation of abx for coverage of sepsis- no clear source at this  time, CXR w/o infiltrate, UA without obvious UTI, and abdomen remains without tenderness to palpation or peritoneal signs.  This is a shared visit with supervising physician Dr. Betsey Holiday who has independently evaluated patient & provided guidance in evaluation/management/disposition, in agreement with care   Portions of this note were generated with Dragon dictation software. Dictation errors may occur despite best attempts at proofreading.  Final Clinical Impression(s) / ED Diagnoses Final diagnoses:  Type 1 diabetes mellitus with ketoacidosis without coma (HCC)  AKI (acute kidney injury) ([place])  Hypothermia, initial encounter    Rx / DC Orders ED Discharge Orders     None        Amaryllis Dyke, PA-C 03/10/21 0705    Orpah Greek, MD 03/10/21 [phone]

## 2021-03-10 NOTE — ED Notes (Signed)
Pt maxed out on Precedex at 1.41mcg/kg/hr, per Cirtical Care NP Renae Fickle Hooffamn's orders. Pt remains extremely agitated and combative and is now breaking out of soft wrist restraints and screaming.This nurse unable to complete necessary pt. Verified with Critical Care NP, CCNP to order additional Ativan for sedation at this time.

## 2021-03-10 NOTE — H&P (Addendum)
NAME:  Raymond [name], MRN:  [id number], DOB:  November 08, 1985, LOS: 0 ADMISSION DATE:  03/10/2021, CONSULTATION DATE:  03/10/21 REFERRING MD:  Dr Blinda Leatherwood EDP, CHIEF COMPLAINT:  DKA   History of Present Illness:  35 yo man with hx of DMI, borderline personality disorder per chart, depression, here with hyperglycemia, DKA.  Ran out of insulin 1-2 days ago.  Blood sugar has improved from 530 down to 275.  Initial PH 6.952/15/47 on VBG  - no improvement in follow up pH Anion gap at least 26 (HCO3 <7)  BHOB high  WBC 16.3 Satting 100%  Was agitated, received ativan 1mg  2mg  at 3am, now sleepy but arousable.   Hypothermic 95.1 at 4am.    In ED given fluids 3L then 125 per hours.  Started insulin gtt.   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  Pertinent  Medical History  Dm1 Depression Substance abuse  Significant Hospital Events: Including procedures, antibiotic start and stop dates in addition to other pertinent events     Interim History / Subjective:    Objective   Blood pressure 138/87, pulse (!) 104, temperature (!) 95.1 F (35.1 C), temperature source Rectal, resp. rate (!) 23, SpO2 100 %.       No intake or output data in the 24 hours ending 03/10/21 0605 There were no vitals filed for this visit.  Examination: General: middle aged male of normal body habitus HENT: [place]/AT, PERRL, no JVD.  Lungs: Clear, Kussmaul breathing.   Cardiovascular: RRR, no MRG Abdomen: Soft, non-distended, hypoactive.  Extremities: Multiple abrasions to lower extremity. No deformity or edema.  Neuro: Somnolent, arouses in agitated fashion to tactile stimuli.   Resolved Hospital Problem list     Assessment & Plan:   Diabetic ketoacidosis: he was been without insulin for 2 days. Type 1 DM - insulin infusion per endotool protocol - 3L IVF given in ED. MIVF at 125/hr. Additional bolus infusing now as well.  - May need  temporary bicarb infusion pending current ABG result. VBG pH has been consistently less than 7. DC once pH >7.25 on VBG -BMP q 4 hours,replete electrolytes as indicated  Acute metabolic encephalopathy secondary to DKA and amphetamine abuse - Given multiple doses of Ativan in ED - would opt for Precedex infusion should become combative again, currently no need - Should correct with DKA tx and fluids for amphetamines to clear.   Leukocytosis: ? DKA triggered by sepsis, doubt. No clear source. Without insulin for 2 days. Treated recently for furuncle on right side of neck.  - Continue cefepime for now - DC flagyl, vanco.  - Low threshold to DC - Check PCT for completeness.  Substance abuse: amphetamines positive on UDS - counseling when appropriate.   AKI - hydrate - follow BMP  Best Practice (right click and "Reselect all SmartList Selections" daily)   Diet/type: NPO DVT prophylaxis: LMWH GI prophylaxis: PPI Lines: N/A Foley:  N/A Code Status:  full code Last date of multidisciplinary goals of care discussion [   ]  Labs   CBC: Recent Labs  Lab 03/10/21 0246  WBC 16.3*  NEUTROABS 14.7*  HGB 13.9  HCT 44.8  MCV 94.7  PLT 530*    Basic Metabolic Panel: Recent Labs  Lab 03/10/21 0246  NA 134*  K 5.7*  CL 101  CO2 <7*  GLUCOSE 548*  BUN 35*  CREATININE 2.38*  CALCIUM 8.3*   GFR: CrCl cannot be  calculated (Unknown ideal weight.). Recent Labs  Lab 03/10/21 0246  WBC 16.3*    Liver Function Tests: Recent Labs  Lab 03/10/21 0246  AST 34  ALT 55*  ALKPHOS 167*  BILITOT 1.9*  PROT 7.6  ALBUMIN 3.5   No results for input(s): LIPASE, AMYLASE in the last 168 hours. No results for input(s): AMMONIA in the last 168 hours.  ABG    Component Value Date/Time   HCO3 3.0 (L) 03/10/2021 0538   TCO2 7 (L) 10/04/2018 1711   ACIDBASEDEF 29.4 (H) 03/10/2021 0538   O2SAT 91.7 03/10/2021 0538     Coagulation Profile: No results for input(s): INR, PROTIME in  the last 168 hours.  Cardiac Enzymes: No results for input(s): CKTOTAL, CKMB, CKMBINDEX, TROPONINI in the last 168 hours.  HbA1C: Hgb A1c MFr Bld  Date/Time Value Ref Range Status  10/05/2018 06:37 PM 12.1 (H) 4.8 - 5.6 % Final    Comment:    (NOTE)         Prediabetes: 5.7 - 6.4         Diabetes: >6.4         Glycemic control for adults with diabetes: <7.0   10/04/2018 09:21 PM 12.2 (H) 4.8 - 5.6 % Final    Comment:    (NOTE)         Prediabetes: 5.7 - 6.4         Diabetes: >6.4         Glycemic control for adults with diabetes: <7.0     CBG: Recent Labs  Lab 03/10/21 0224 03/10/21 0335 03/10/21 0410 03/10/21 0442 03/10/21 0507  GLUCAP 443* 530* 477* 455* 383*    Review of Systems:   Patient is encephalopathic and/or intubated. Therefore history has been obtained from chart review.    Past Medical History:  He,  has a past medical history of Borderline personality disorder (HCC), Diabetes mellitus without complication (HCC), and Gallstones.   Surgical History:   Past Surgical History:  Procedure Laterality Date   CHOLECYSTECTOMY       Social History:   reports that he has never smoked. His smokeless tobacco use includes chew. He reports current alcohol use. He reports current drug use. Drugs: Other-see comments and Methamphetamines.   Family History:  His family history is not on file.   Allergies Allergies  Allergen Reactions   Ibuprofen Anaphylaxis and Swelling    Facial swelling   Tramadol Other (See Comments)    Hypoglycemic Shock     Home Medications  Prior to Admission medications   Medication Sig Start Date End Date Taking? Authorizing Provider  atorvastatin (LIPITOR) 10 MG tablet Take 10 mg by mouth at bedtime. 11/30/19   [provider]  LANTUS SOLOSTAR 100 UNIT/ML Solostar Pen Inject 10 Units into the skin at bedtime. 01/06/21   [provider]  lisinopril (ZESTRIL) 10 MG tablet Take 1 tablet (10 mg total) by mouth daily.  02/04/21 03/10/22  Linwood Dibbles, MD  NOVOLIN 70/30 RELION (70-30) 100 UNIT/ML injection Inject 20-40 Units into the skin See admin instructions. Inject 40 units into the skin in the morning, 20 units at noon, 30 units at bedtime 07/06/18   [provider]     Critical care time: 39 minutes     Joneen Roach, AGACNP-BC [facility]  See Amion for personal pager PCCM on call pager [phone] until 7pm. Please call Elink 7p-7a. [phone]  03/10/2021 7:52 AM    PCCM:  35 year old young gentleman, borderline personality disorder per chart presents to the ER after running out of his insulin found to be in severe DKA with a pH of 6.9.  Bicarbonate less than 7.  He was acutely agitated UDS positive for amphetamines.  In the emergency department the patient was placed in a Posey belt four-point restraints broke through the restraints being held down by [employer] as well as for nurse techs.  Patient was sedated using Precedex and multiple aliquots of 4 mg of Ativan until goal RASS was obtained.  BP (!) 151/72   Pulse 91   Temp 98.7 F (37.1 C) (Oral)   Resp (!) 25   Ht 5\' 10"  (1.778 m)   Wt 83.8 kg   SpO2 100%   BMI 26.51 kg/m   General: Middle-aged male appears of age, multiple tattoos HEENT: Mucous membranes dry, sclera anicteric Heart: Tachycardic, regular S1-S2 Lungs: Bilateral ventilated breath sounds Abdomen: Nontender nondistended  Labs: Reviewed pH 6.9 on initial ABG, bicarb less than 7 Hyperglycemia UDS positive amphetamines  Assessment: Type 1 diabetes with diabetic ketoacidosis, secondary to not using insulin plus polysubstance abuse Unknown polysubstance history, UDS positive for amphetamines Now with acute metabolic toxic delirium Hypothermia Leukocytosis secondary to above AKI  Plan: Admit to the intensive care unit Multiple doses of IV Ativan for CIWA protocol Single dose IV Zyprexa - Patient's mental status seems to  be more calm at this time. Continue IV fluids Continue IV insulin Continue bicarbonate infusion Scheduled BMPs. If too much sedation is needed that we are in fear of protecting his airway he may end up intubated. However with his acidosis and polysubstance abuse overdose I would prefer to leave him not intubated as long as he is able to protect his airway. His respiratory status is very important for the management of his acidosis at this time.  I walked with the transport team to escort the patient from the emergency department to the intensive care unit.  This patient is critically ill with multiple organ system failure; which, requires frequent high complexity decision making, assessment, support, evaluation, and titration of therapies. This was completed through the application of advanced monitoring technologies and extensive interpretation of multiple databases. During this encounter critical care time was devoted to patient care services described in this note for 60 minutes.  , DO [facility] 03/10/2021 11:45 AM

## 2021-03-10 NOTE — ED Notes (Signed)
Pt remains extremely agitated. Precedex titrated to 1.23mcg/kg/hr.

## 2021-03-10 NOTE — ED Triage Notes (Signed)
Pt BIB from home with complaints of hyperglycemia. Pt has been out of his insulin for 1.5 days. Pt is a type 1 diabetic.

## 2021-03-10 NOTE — ED Notes (Signed)
I-stat recollect ran, sample bad

## 2021-03-10 NOTE — Progress Notes (Signed)
Pharmacy Antibiotic Note  Raymond [name] is a 35 y.o. male admitted on 03/10/2021 with sepsis.  Pharmacy has been consulted for Vancomycin + Cefepime dosing. Scr acutely elevated- estimated CrCl ~43ml/min  Plan: Cefepime 2gm IV q12h Vancomycin 2gm IV x1 then 1250mg  IV q24h to target AUC 400-550 Check Vancomycin levels at steady state Monitor renal function and cx data      Temp (24hrs), Avg:95.7 F (35.4 C), Min:95.1 F (35.1 C), Max:96.3 F (35.7 C)  Recent Labs  Lab 03/10/21 0246  WBC 16.3*  CREATININE 2.38*    CrCl cannot be calculated (Unknown ideal weight.).    Allergies  Allergen Reactions   Ibuprofen Anaphylaxis and Swelling    Facial swelling   Tramadol Other (See Comments)    Hypoglycemic Shock    Antimicrobials this admission: 12/13 Cefepime  >>  12/13 Vancomycin >>  12/13 Metronidazole >>  Dose adjustments this admission:  Microbiology results: 12/13 BCx:   Thank you for allowing pharmacy to be a part of this patient's care.  1/14 PharmD 03/10/2021 6:28 AM

## 2021-03-10 NOTE — ED Notes (Signed)
Critical care and RT at the bedside.

## 2021-03-10 NOTE — Progress Notes (Signed)
eLink Physician-Brief Progress Note Patient Name: Raymond [name] DOB: 1986-02-18 MRN: [id number]   Date of Service  03/10/2021  HPI/Events of Note  Rectal temperature 93.3 degrees.  eICU Interventions  [employer] ordered.        Thomasene Lot [name] 03/10/2021, 9:28 PM

## 2021-03-10 NOTE — ED Notes (Signed)
pH 7.083. pCO2 <19.0 Received from [facility], lab.

## 2021-03-10 NOTE — Progress Notes (Signed)
eLink Physician-Brief Progress Note Patient Name: Raymond [name] DOB: 07/11/1985 MRN: [id number]   Date of Service  03/10/2021  HPI/Events of Note  MAP 59, heart rate 60.  eICU Interventions  Peripheral Norepinephrine gtt ordered, will check ABG to r/o acidosis contributing to soft BP.        Thomasene Lot Kealohilani Maiorino 03/10/2021, 10:29 PM

## 2021-03-10 NOTE — ED Notes (Addendum)
Per Joneen Roach, Critical Care NP's order, will start Precedex at 1.64mcg/kg/hr d/t pt's extreme agitation, soft restraint use, and lack of response to Ativan 1mg  IV.

## 2021-03-10 NOTE — ED Notes (Addendum)
Bad sample. RN aware of recollect for istat.

## 2021-03-10 NOTE — Progress Notes (Signed)
Per Dr. Tonia Brooms, keep bicarb drip running until bag empty. pH 7.292, Co2 13. Will continue to monitor lab values

## 2021-03-10 NOTE — ED Notes (Signed)
Pt placed on bare hugger and MD notified of pt's rectal temp

## 2021-03-10 NOTE — ED Notes (Signed)
Dr. Tonia Brooms at the bedside.

## 2021-03-11 LAB — PROCALCITONIN: Procalcitonin: 1.95 ng/mL

## 2021-03-11 LAB — GLUCOSE, CAPILLARY
Glucose-Capillary: 104 mg/dL — ABNORMAL HIGH (ref 70–99)
Glucose-Capillary: 126 mg/dL — ABNORMAL HIGH (ref 70–99)
Glucose-Capillary: 132 mg/dL — ABNORMAL HIGH (ref 70–99)
Glucose-Capillary: 144 mg/dL — ABNORMAL HIGH (ref 70–99)
Glucose-Capillary: 157 mg/dL — ABNORMAL HIGH (ref 70–99)
Glucose-Capillary: 159 mg/dL — ABNORMAL HIGH (ref 70–99)
Glucose-Capillary: 183 mg/dL — ABNORMAL HIGH (ref 70–99)
Glucose-Capillary: 183 mg/dL — ABNORMAL HIGH (ref 70–99)
Glucose-Capillary: 184 mg/dL — ABNORMAL HIGH (ref 70–99)
Glucose-Capillary: 186 mg/dL — ABNORMAL HIGH (ref 70–99)
Glucose-Capillary: 207 mg/dL — ABNORMAL HIGH (ref 70–99)
Glucose-Capillary: 40 mg/dL — CL (ref 70–99)
Glucose-Capillary: 56 mg/dL — ABNORMAL LOW (ref 70–99)
Glucose-Capillary: 89 mg/dL (ref 70–99)

## 2021-03-11 MED ORDER — INSULIN DETEMIR 100 UNIT/ML ~~LOC~~ SOLN: 8.0000 [IU] | Freq: Two times a day (BID) | SUBCUTANEOUS | Status: DC

## 2021-03-11 MED ORDER — LIP MEDEX EX OINT
TOPICAL_OINTMENT | CUTANEOUS | Status: DC | PRN
  Administered 2021-03-13: 1 via TOPICAL
  Filled 2021-03-11: qty 7

## 2021-03-11 MED ORDER — FENTANYL CITRATE (PF) 100 MCG/2ML IJ SOLN
25.0000 ug | INTRAMUSCULAR | Status: DC | PRN
  Administered 2021-03-11 – 2021-03-12 (×4): 25 ug via INTRAVENOUS
  Filled 2021-03-11 (×4): qty 2

## 2021-03-11 MED ORDER — INSULIN ASPART 100 UNIT/ML IJ SOLN
2.0000 [IU] | INTRAMUSCULAR | Status: DC
Start: 2021-03-11 — End: 2021-03-13
  Administered 2021-03-11 – 2021-03-12 (×3): 2 [IU] via SUBCUTANEOUS

## 2021-03-11 MED ORDER — DEXTROSE 50 % IV SOLN
25.0000 g | INTRAVENOUS | Status: AC
  Administered 2021-03-11: 16:00:00 25 g via INTRAVENOUS

## 2021-03-11 MED ORDER — INSULIN DETEMIR 100 UNIT/ML ~~LOC~~ SOLN
8.0000 [IU] | Freq: Two times a day (BID) | SUBCUTANEOUS | Status: DC
  Administered 2021-03-11 – 2021-03-12 (×4): 8 [IU] via SUBCUTANEOUS
  Filled 2021-03-11 (×6): qty 0.08

## 2021-03-11 MED ORDER — DEXTROSE 5 % IV SOLN: INTRAVENOUS | Status: DC

## 2021-03-11 MED ORDER — DEXTROSE 50 % IV SOLN
12.5000 g | INTRAVENOUS | Status: AC
  Administered 2021-03-11: 12:00:00 12.5 g via INTRAVENOUS

## 2021-03-11 MED ORDER — SODIUM CHLORIDE 0.9 % IV SOLN
2.0000 g | INTRAVENOUS | Status: AC
  Administered 2021-03-11 – 2021-03-14 (×4): 2 g via INTRAVENOUS
  Filled 2021-03-11 (×4): qty 20

## 2021-03-11 MED ORDER — HALOPERIDOL LACTATE 5 MG/ML IJ SOLN
5.0000 mg | Freq: Four times a day (QID) | INTRAMUSCULAR | Status: DC | PRN
  Administered 2021-03-11 – 2021-03-16 (×9): 5 mg via INTRAVENOUS
  Filled 2021-03-11 (×9): qty 1

## 2021-03-11 NOTE — Progress Notes (Signed)
eLink Physician-Brief Progress Note Patient Name: Raymond [name] DOB: [birth year] MRN: [id number]   Date of Service  03/11/2021  HPI/Events of Note  Anion gap is 12 x 2 separate checks, no recent Beta hydroxybutyric acid level.  eICU Interventions  Okay to transition off Insulin gtt per endotool recommendation.        Thomasene Lot Charlii Yost 03/11/2021, 2:05 AM

## 2021-03-11 NOTE — Progress Notes (Signed)
eLink Physician-Brief Progress Note Patient Name: Raymond [name] DOB: January 14, 1986 MRN: [id number]   Date of Service  03/11/2021  HPI/Events of Note  Patient needs restraints orders renewed for his safety.  eICU Interventions  Restraints orders renewed.        [name] 03/11/2021, 9:45 PM

## 2021-03-11 NOTE — Progress Notes (Signed)
Pt has not voided since admission to the floor. Bladder scan revealed >999cc of urine retained in the bladder. Elink contacted. In/out cath ordered. 1600cc of urine drained from the bladder. Pt tolerated procedure well.

## 2021-03-11 NOTE — Progress Notes (Signed)
An USGPIV (ultrasound guided PIV) has been placed for short-term vasopressor infusion. A correctly placed ivWatch must be used when administering Vasopressors. Should this treatment be needed beyond 72 hours, central line access should be obtained.  It will be the responsibility of the bedside nurse to follow best practice to prevent extravasations.   ?

## 2021-03-11 NOTE — Progress Notes (Signed)
Pt has Levophed infusing in a PIV. IV watchman not avail. Supply will retrieve one from [facility]. PIV will be monitored for extravasation manually until it arrives.   03/11/21 0015  Vitals  Temp (!) 97 F (36.1 C)  BP (!) 99/46  MAP (mmHg) (!) 63  Pulse Rate 68  ECG Heart Rate 69  Resp 18  Oxygen Therapy  SpO2 98 %  End Tidal CO2 (EtCO2) 26  MEWS Score  MEWS Temp 0  MEWS Systolic 1  MEWS Pulse 0  MEWS RR 0  MEWS LOC 0  MEWS Score 1  MEWS Score Color Green

## 2021-03-11 NOTE — Progress Notes (Signed)
NAME:  Raymond [name], MRN:  [id number], DOB:  Jan 21, 1986, LOS: 1 ADMISSION DATE:  03/10/2021, CONSULTATION DATE:  03/10/21 REFERRING MD:  Dr Blinda Leatherwood EDP, CHIEF COMPLAINT:  DKA   History of Present Illness:  35 yo man with hx of DMI, borderline personality disorder per chart, depression, here with hyperglycemia, DKA.  Ran out of insulin 1-2 days ago.  Blood sugar has improved from 530 down to 275.  Initial PH 6.952/15/47 on VBG  - no improvement in follow up pH Anion gap at least 26 (HCO3 <7)  BHOB high  WBC 16.3 Satting 100%  Was agitated, received ativan 1mg  2mg  at 3am, now sleepy but arousable.   Hypothermic 95.1 at 4am.    In ED given fluids 3L then 125 per hours.  Started insulin gtt.   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   Pertinent  Medical History  Dm1 Depression Substance abuse  Significant Hospital Events: Including procedures, antibiotic start and stop dates in addition to other pertinent events     Interim History / Subjective:   Still in restraints. On pressors overnight due to sedation .   Objective   Blood pressure (!) 112/58, pulse 67, temperature (!) 97.5 F (36.4 C), resp. rate 15, height 5\' 10"  (1.778 m), weight 83.8 kg, SpO2 100 %.        Intake/Output Summary (Last 24 hours) at 03/11/2021 Last data filed at 03/11/2021 0700 Gross per 24 hour  Intake 6226.41 ml  Output --  Net 6226.41 ml   Filed Weights   03/10/21 0800 03/10/21 0811 03/10/21 1100  Weight: 81.6 kg 81.6 kg 83.8 kg    Examination: General: middle again male, resting in bed, in restraints, sedated  HENT: NCAT Lungs: comfrtable respirations, no crackles no wheeze  Cardiovascular: RRR, s1 s2  Abdomen: soft, nt nd  Extremities: multiple abrasions  Neuro: somnolent, awakes up if simulated   Resolved Hospital Problem list     Assessment & Plan:   Diabetic ketoacidosis, without insulin Type 1  DM P: Switched from insulin ggt  Continue subq injections  On endotool  Bicarb infusion stopped   Acute metabolic encephalopathy secondary to DKA and amphetamine abuse P; He is still de-toxing  Continued need for precedex to control agiation  Continue prn ativan or antipsychotics if needed  He will need counseling afterwards   Leukocytosis: ? DKA triggered by sepsis, doubt. No clear source. Without insulin for 2 days. Treated recently for furuncle on right side of neck.  P; Complete 5 days of ceftriaxone  Stopped cefepime   Substance abuse: amphetamines positive on UDS - op counseling   AKI P: Follow uop Follow scr   Urinary retention  P; Continue bladder scans  If continued need for I/O he may need foley   Best Practice (right click and "Reselect all SmartList Selections" daily)   Diet/type: NPO DVT prophylaxis: LMWH GI prophylaxis: PPI Lines: N/A Foley:  N/A Code Status:  full code Last date of multidisciplinary goals of care discussion [   ]  Labs   CBC: Recent Labs  Lab 03/10/21 0246  WBC 16.3*  NEUTROABS 14.7*  HGB 13.9  HCT 44.8  MCV 94.7  PLT 530*    Basic Metabolic Panel: Recent Labs  Lab 03/10/21 0630 03/10/21 1117 03/10/21 1437 03/10/21 1737 03/10/21 2125  NA 139 142 142 141 142  K 4.5 4.2 3.9 3.7 3.4*  CL 110 117* 117* 116* 116*  CO2 <7* <7* 8* 13* 14*  GLUCOSE 284* 175* 167* 159* 204*  BUN 35* 33* 29* 28* 27*  CREATININE 2.22* 2.05* 1.91* 1.73* 1.72*  CALCIUM 7.6* 7.1* 7.1* 7.1* 7.1*   GFR: Estimated Creatinine Clearance: 61.9 mL/min (A) (by C-G formula based on SCr of 1.72 mg/dL (H)). Recent Labs  Lab 03/10/21 0246 03/10/21 0636 03/10/21 0835 03/11/21 0245  PROCALCITON  --   --   --  1.95  WBC 16.3*  --   --   --   LATICACIDVEN  --  1.1 1.0  --     Liver Function Tests: Recent Labs  Lab 03/10/21 0246  AST 34  ALT 55*  ALKPHOS 167*  BILITOT 1.9*  PROT 7.6  ALBUMIN 3.5   No results for input(s): LIPASE, AMYLASE  in the last 168 hours. No results for input(s): AMMONIA in the last 168 hours.  ABG    Component Value Date/Time   PHART 7.371 03/10/2021 2310   PCO2ART 28.3 (L) 03/10/2021 2310   PO2ART 94.5 03/10/2021 2310   HCO3 16.5 (L) 03/10/2021 2310   TCO2 7 (L) 10/04/2018 1711   ACIDBASEDEF 7.8 (H) 03/10/2021 2310   O2SAT 98.4 03/10/2021 2310     Coagulation Profile: Recent Labs  Lab 03/10/21 0630  INR 1.0    Cardiac Enzymes: No results for input(s): CKTOTAL, CKMB, CKMBINDEX, TROPONINI in the last 168 hours.  HbA1C: Hgb A1c MFr Bld  Date/Time Value Ref Range Status  03/10/2021 02:55 AM 10.8 (H) 4.8 - 5.6 % Final    Comment:    (NOTE) Pre diabetes:          5.7%-6.4%  Diabetes:              >6.4%  Glycemic control for   <7.0% adults with diabetes   10/05/2018 06:37 PM 12.1 (H) 4.8 - 5.6 % Final    Comment:    (NOTE)         Prediabetes: 5.7 - 6.4         Diabetes: >6.4         Glycemic control for adults with diabetes: <7.0     CBG: Recent Labs  Lab 03/11/21 0303 03/11/21 0358 03/11/21 0436 03/11/21 0541 03/11/21 0754  GLUCAP 207* 183* 157* 159* 126*    Review of Systems:   Patient is encephalopathic and/or intubated. Therefore history has been obtained from chart review.    Past Medical History:  He,  has a past medical history of Borderline personality disorder (HCC), Diabetes mellitus without complication (HCC), and Gallstones.   Surgical History:   Past Surgical History:  Procedure Laterality Date   CHOLECYSTECTOMY       Social History:   reports that he has never smoked. His smokeless tobacco use includes chew. He reports current alcohol use. He reports current drug use. Drugs: Other-see comments and Methamphetamines.   Family History:  His family history is not on file.   Allergies Allergies  Allergen Reactions   Ibuprofen Anaphylaxis and Swelling    Facial swelling   Tramadol Other (See Comments)    Hypoglycemic Shock     Home  Medications  Prior to Admission medications   Medication Sig Start Date End Date Taking? Authorizing Provider  atorvastatin (LIPITOR) 10 MG tablet Take 10 mg by mouth at bedtime. 11/30/19   [provider]  LANTUS SOLOSTAR 100 UNIT/ML Solostar Pen Inject 10 Units into the skin at bedtime. 01/06/21   [provider]  lisinopril (ZESTRIL) 10  MG tablet Take 1 tablet (10 mg total) by mouth daily. 02/04/21 03/10/22  Linwood Dibbles, MD  NOVOLIN 70/30 RELION (70-30) 100 UNIT/ML injection Inject 20-40 Units into the skin See admin instructions. Inject 40 units into the skin in the morning, 20 units at noon, 30 units at bedtime 07/06/18   [provider]     This patient is critically ill with multiple organ system failure; which, requires frequent high complexity decision making, assessment, support, evaluation, and titration of therapies. This was completed through the application of advanced monitoring technologies and extensive interpretation of multiple databases. During this encounter critical care time was devoted to patient care services described in this note for 36 minutes.   Josephine Igo, DO [facility] 03/11/2021 9:21 AM

## 2021-03-12 ENCOUNTER — Inpatient Hospital Stay (HOSPITAL_COMMUNITY): Payer: Self-pay

## 2021-03-12 DIAGNOSIS — N179 Acute kidney failure, unspecified: Secondary | ICD-10-CM

## 2021-03-12 LAB — BASIC METABOLIC PANEL
Anion gap: 7 (ref 5–15)
BUN: 11 mg/dL (ref 6–20)
CO2: 25 mmol/L (ref 22–32)
Calcium: 7.9 mg/dL — ABNORMAL LOW (ref 8.9–10.3)
Chloride: 111 mmol/L (ref 98–111)
Creatinine, Ser: 0.79 mg/dL (ref 0.61–1.24)
GFR, Estimated: >60 mL/min (ref >60)
Glucose, Bld: 94 mg/dL (ref 70–99)
Potassium: 2.6 mmol/L — CL (ref 3.5–5.1)
Sodium: 143 mmol/L (ref 135–145)

## 2021-03-12 LAB — CBC
HCT: 34.2 % — ABNORMAL LOW (ref 39.0–52.0)
Hemoglobin: 11.6 g/dL — ABNORMAL LOW (ref 13.0–17.0)
MCH: 29.7 pg (ref 26.0–34.0)
MCHC: 33.9 g/dL (ref 30.0–36.0)
MCV: 87.7 fL (ref 80.0–100.0)
Platelets: 274 10*3/uL (ref 150–400)
RBC: 3.9 MIL/uL — ABNORMAL LOW (ref 4.22–5.81)
RDW: 14 % (ref 11.5–15.5)
WBC: 6.5 10*3/uL (ref 4.0–10.5)
nRBC: 0 % (ref 0.0–0.2)

## 2021-03-12 LAB — HIV ANTIBODY (ROUTINE TESTING W REFLEX): HIV Screen 4th Generation wRfx: NONREACTIVE

## 2021-03-12 LAB — GLUCOSE, CAPILLARY
Glucose-Capillary: 110 mg/dL — ABNORMAL HIGH (ref 70–99)
Glucose-Capillary: 135 mg/dL — ABNORMAL HIGH (ref 70–99)
Glucose-Capillary: 143 mg/dL — ABNORMAL HIGH (ref 70–99)
Glucose-Capillary: 41 mg/dL — CL (ref 70–99)
Glucose-Capillary: 74 mg/dL (ref 70–99)
Glucose-Capillary: 84 mg/dL (ref 70–99)
Glucose-Capillary: 91 mg/dL (ref 70–99)

## 2021-03-12 LAB — POTASSIUM: Potassium: 3.1 mmol/L — ABNORMAL LOW (ref 3.5–5.1)

## 2021-03-12 LAB — PHOSPHORUS: Phosphorus: 1.5 mg/dL — ABNORMAL LOW (ref 2.5–4.6)

## 2021-03-12 LAB — AMMONIA: Ammonia: 51 umol/L — ABNORMAL HIGH (ref 9–35)

## 2021-03-12 LAB — PROCALCITONIN: Procalcitonin: 0.78 ng/mL

## 2021-03-12 MED ORDER — THIAMINE HCL 100 MG PO TABS
100.0000 mg | ORAL_TABLET | Freq: Every day | ORAL | Status: DC
  Administered 2021-03-13 – 2021-03-14 (×2): 100 mg
  Filled 2021-03-12 (×3): qty 1

## 2021-03-12 MED ORDER — THIAMINE HCL 100 MG/ML IJ SOLN
100.0000 mg | Freq: Every day | INTRAMUSCULAR | Status: DC
  Administered 2021-03-15 – 2021-03-16 (×2): 100 mg via INTRAVENOUS
  Filled 2021-03-12 (×3): qty 2

## 2021-03-12 MED ORDER — ADULT MULTIVITAMIN LIQUID CH
15.0000 mL | Freq: Every day | ORAL | Status: DC
  Administered 2021-03-12 – 2021-03-15 (×4): 15 mL
  Filled 2021-03-12 (×4): qty 15

## 2021-03-12 MED ORDER — DEXTROSE-NACL 5-0.45 % IV SOLN: INTRAVENOUS | Status: DC

## 2021-03-12 MED ORDER — POTASSIUM CHLORIDE 20 MEQ PO PACK
40.0000 meq | PACK | ORAL | Status: AC
  Administered 2021-03-12 (×2): 40 meq
  Filled 2021-03-12 (×2): qty 2

## 2021-03-12 MED ORDER — POTASSIUM CHLORIDE 10 MEQ/100ML IV SOLN
10.0000 meq | INTRAVENOUS | Status: AC
  Administered 2021-03-12 (×4): 10 meq via INTRAVENOUS
  Filled 2021-03-12 (×4): qty 100

## 2021-03-12 MED ORDER — POTASSIUM PHOSPHATES 15 MMOLE/5ML IV SOLN
30.0000 mmol | Freq: Once | INTRAVENOUS | Status: AC
  Administered 2021-03-12: 30 mmol via INTRAVENOUS
  Filled 2021-03-12: qty 10

## 2021-03-12 MED ORDER — SODIUM PHOSPHATES 45 MMOLE/15ML IV SOLN: 30.0000 mmol | Freq: Once | INTRAVENOUS | Status: DC

## 2021-03-12 MED ORDER — QUETIAPINE FUMARATE 50 MG PO TABS
50.0000 mg | ORAL_TABLET | Freq: Two times a day (BID) | ORAL | Status: DC
  Administered 2021-03-12 – 2021-03-15 (×7): 50 mg
  Filled 2021-03-12 (×7): qty 1

## 2021-03-12 MED ORDER — FOLIC ACID 1 MG PO TABS
1.0000 mg | ORAL_TABLET | Freq: Every day | ORAL | Status: DC
  Administered 2021-03-13 – 2021-03-16 (×4): 1 mg
  Filled 2021-03-12 (×4): qty 1

## 2021-03-12 MED ORDER — CLONAZEPAM 1 MG PO TABS
1.0000 mg | ORAL_TABLET | Freq: Two times a day (BID) | ORAL | Status: DC
  Administered 2021-03-12 – 2021-03-13 (×4): 1 mg
  Filled 2021-03-12 (×4): qty 1

## 2021-03-12 NOTE — Progress Notes (Signed)
eLink Physician-Brief Progress Note Patient Name: Raymond [name] DOB: [birth year] MRN: [id number]   Date of Service  03/12/2021  HPI/Events of Note  Patient needs renewal of safety sitter and wrist restraints orders.  eICU Interventions  Orders renewed.        Thomasene Lot Ajit Errico 03/12/2021, 11:04 PM

## 2021-03-12 NOTE — Progress Notes (Signed)
eLink Physician-Brief Progress Note Patient Name: Raymond [name] DOB: 14-Aug-1985 MRN: [id number]   Date of Service  03/12/2021  HPI/Events of Note  K+ 3.1, Blood sugar 41.  eICU Interventions  E-link nursing electrolyte replacement protocol ordered, D 5 % I/2 Normal saline substituted for D 5 / 0.45  % Saline and gtt rate increased to 75 ml / hour.        Thomasene Lot Essam Lowdermilk 03/12/2021, 11:44 PM

## 2021-03-12 NOTE — TOC Initial Note (Signed)
Transition of Care [facility]) - Initial/Assessment Note    Patient Details  Name: Raymond [name] MRN: [id number] Date of Birth: [birth year]  Transition of Care [facility]) CM/SW Contact:    Golda Acre, RN Phone Number: 03/12/2021, 7:53 AM  Clinical Narrative:                  Transition of Care [facility]) Screening Note   Patient Details  Name: Raymond [name] Date of Birth: 1985/09/19   Transition of Care [facility]) CM/SW Contact:    Golda Acre, RN Phone Number: 03/12/2021, 7:53 AM    Transition of Care Department [facility]) has reviewed patient and no TOC needs have been identified at this time. We will continue to monitor patient advancement through interdisciplinary progression rounds. If new patient transition needs arise, please place a TOC consult.  TOC PLAN OF CARE: will need substance abuse information when more stable.  Lives alone. Does have significant other as support.  Expected Discharge Plan: Home/Self Care Barriers to Discharge: Continued Medical Work up   Patient Goals and CMS Choice Patient states their goals for this hospitalization and ongoing recovery are:: to go back home      Expected Discharge Plan and Services Expected Discharge Plan: Home/Self Care   Discharge Planning Services: CM Consult   Living arrangements for the past 2 months: Apartment                                      Prior Living Arrangements/Services Living arrangements for the past 2 months: Apartment Lives with:: Self Patient language and need for interpreter reviewed:: Yes Do you feel safe going back to the place where you live?: Yes            Criminal Activity/Legal Involvement Pertinent to Current Situation/Hospitalization: No - Comment as needed  Activities of Daily Living      Permission Sought/Granted                  Emotional Assessment Appearance:: Appears stated age Attitude/Demeanor/Rapport: Engaged Affect (typically observed):  Constricted Orientation: : Oriented to Place, Oriented to Self, Oriented to  Time, Oriented to Situation Alcohol / Substance Use: Alcohol Use, Tobacco Use, Illicit Drugs Psych Involvement: No (comment)  Admission diagnosis:  Diabetic ketoacidosis (HCC) [E11.10] AKI (acute kidney injury) (HCC) [N17.9] Hypothermia, initial encounter [T68.XXXA] Type 1 diabetes mellitus with ketoacidosis without coma (HCC) [E10.10] Patient Active Problem List   Diagnosis Date Noted   Diabetic ketoacidosis (HCC) 03/10/2021   AKI (acute kidney injury) (HCC)    Type 1 diabetes mellitus with ketoacidosis without coma (HCC)    Acute metabolic encephalopathy    Amphetamine abuse (HCC)    Hypoglycemia due to type 1 diabetes mellitus (HCC) 10/06/2018   DKA (diabetic ketoacidoses) 10/04/2018   MDD (major depressive disorder), recurrent severe, without psychosis (HCC) 12/27/2016   Episodic substance abuse 07/23/2014   Severe recurrent major depression without psychotic features (HCC) 07/22/2014   Suicide attempt [facility])    PCP:  Placey, Chales Abrahams, NP Pharmacy:   [facility] [address], [place] - [address] [place] [address] Phone: [phone] Fax: [phone]  [facility] HEALTH DEPARTMENT - [place], [place] - [place] [address] [place] [address] Phone: [phone] Fax: [phone]     Social Determinants of Health (SDOH) Interventions    Readmission Risk Interventions  No flowsheet data found.

## 2021-03-12 NOTE — Progress Notes (Signed)
° °  NAME:  Raymond [name], MRN:  [id number], DOB:  01-03-1986, LOS: 2 ADMISSION DATE:  03/10/2021, CONSULTATION DATE:  03/10/21 REFERRING MD:  Dr Blinda Leatherwood EDP, CHIEF COMPLAINT:  DKA   History of Present Illness:  35 yo man with hx of DMI, borderline personality disorder per chart, depression, here with hyperglycemia, DKA.  Ran out of insulin 1-2 days ago.  Blood sugar has improved from 530 down to 275.  Initial PH 6.952/15/47 on VBG  - no improvement in follow up pH Anion gap at least 26 (HCO3 <7)  BHOB high  WBC 16.3 Satting 100%  Was agitated, received ativan 1mg  2mg  at 3am, now sleepy but arousable.   Hypothermic 95.1 at 4am.    In ED given fluids 3L then 125 per hours.  Started insulin gtt.   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   Pertinent  Medical History  Dm1 Depression Substance abuse  Significant Hospital Events: Including procedures, antibiotic start and stop dates in addition to other pertinent events     Interim History / Subjective:  Still getting agitated requiring precedex. HD stable.  Objective   Blood pressure 139/70, pulse 68, temperature (!) 97 F (36.1 C), temperature source Axillary, resp. rate 18, height 5\' 10"  (1.778 m), weight 83.8 kg, SpO2 99 %.        Intake/Output Summary (Last 24 hours) at 03/12/2021 1135 Last data filed at 03/12/2021 1043 Gross per 24 hour  Intake 817.43 ml  Output 2000 ml  Net -1182.57 ml    Filed Weights   03/10/21 0800 03/10/21 0811 03/10/21 1100  Weight: 81.6 kg 81.6 kg 83.8 kg    Examination: Agitated trying to get out of bed Has molloscum type lesions over legs and arms Multiple tattoos and piercings AO to self, RASS +2 Ext warm, lungs clear, heart sounds regular  No labs since 12/13 Sugars okay Resolved Hospital Problem list     Assessment & Plan:  DM1 poorly controlled DKA resolved Ongoing severe metabolic encephalopathy in  setting of critical illness and methamphetamine abuse, still appears to be coming down from something Acute renal failure Intermittent urinary retention Thrombocytosis Hx borderline personality disorder and bipolar- per mother, unclear what his baseline is like  - Basal bolus insulin as ordered - Fine for 5 days ceftriaxone although not sure what we are treating - Recheck BMP, CBC, Mg, Phos - Precedex and soft restraints for agitated delirium, high fall risk - Cortrak and will start seroquel 50mg  BID and klonipin 1mg  BID; clonidine/precedex taper may also be needed  Best Practice (right click and "Reselect all SmartList Selections" daily)   Diet/type: NPO DVT prophylaxis: LMWH GI prophylaxis: PPI Lines: N/A Foley:  N/A Code Status:  full code Last date of multidisciplinary goals of care discussion [  mother updated 03/12/21 at bedside ]   Patient critically ill due to encephalopathy, delirium Interventions to address this today precedex titration Risk of deterioration without these interventions is high  I personally spent 39 minutes providing critical care not including any separately billable procedures  03/12/21 MD [facility]  Prefer epic messenger for cross cover needs If after hours, please call E-link

## 2021-03-13 DIAGNOSIS — E1010 Type 1 diabetes mellitus with ketoacidosis without coma: Principal | ICD-10-CM

## 2021-03-13 DIAGNOSIS — F1593 Other stimulant use, unspecified with withdrawal: Secondary | ICD-10-CM

## 2021-03-13 LAB — GLUCOSE, CAPILLARY
Glucose-Capillary: 43 mg/dL — CL (ref 70–99)
Glucose-Capillary: 149 mg/dL — ABNORMAL HIGH (ref 70–99)
Glucose-Capillary: 77 mg/dL (ref 70–99)
Glucose-Capillary: 98 mg/dL (ref 70–99)
Glucose-Capillary: 156 mg/dL — ABNORMAL HIGH (ref 70–99)
Glucose-Capillary: 196 mg/dL — ABNORMAL HIGH (ref 70–99)
Glucose-Capillary: 330 mg/dL — ABNORMAL HIGH (ref 70–99)

## 2021-03-13 LAB — PHOSPHORUS
Phosphorus: 2.3 mg/dL — ABNORMAL LOW (ref 2.5–4.6)
Phosphorus: 3.2 mg/dL (ref 2.5–4.6)
Phosphorus: 2.6 mg/dL (ref 2.5–4.6)

## 2021-03-13 LAB — CBC
HCT: 32.5 % — ABNORMAL LOW (ref 39.0–52.0)
Hemoglobin: 10.9 g/dL — ABNORMAL LOW (ref 13.0–17.0)
MCH: 29.6 pg (ref 26.0–34.0)
MCHC: 33.5 g/dL (ref 30.0–36.0)
MCV: 88.3 fL (ref 80.0–100.0)
Platelets: 252 10*3/uL (ref 150–400)
RBC: 3.68 MIL/uL — ABNORMAL LOW (ref 4.22–5.81)
RDW: 14 % (ref 11.5–15.5)
WBC: 6 10*3/uL (ref 4.0–10.5)
nRBC: 0 % (ref 0.0–0.2)

## 2021-03-13 LAB — BASIC METABOLIC PANEL
Anion gap: 7 (ref 5–15)
BUN: 9 mg/dL (ref 6–20)
CO2: 24 mmol/L (ref 22–32)
Calcium: 8 mg/dL — ABNORMAL LOW (ref 8.9–10.3)
Chloride: 114 mmol/L — ABNORMAL HIGH (ref 98–111)
Creatinine, Ser: 0.89 mg/dL (ref 0.61–1.24)
GFR, Estimated: >60 mL/min (ref >60)
Glucose, Bld: 57 mg/dL — ABNORMAL LOW (ref 70–99)
Potassium: 3 mmol/L — ABNORMAL LOW (ref 3.5–5.1)
Sodium: 145 mmol/L (ref 135–145)

## 2021-03-13 LAB — MAGNESIUM
Magnesium: 2.2 mg/dL (ref 1.7–2.4)
Magnesium: 2 mg/dL (ref 1.7–2.4)
Magnesium: 1.9 mg/dL (ref 1.7–2.4)

## 2021-03-13 MED ORDER — POTASSIUM PHOSPHATES 15 MMOLE/5ML IV SOLN
15.0000 mmol | Freq: Once | INTRAVENOUS | Status: AC
  Administered 2021-03-13: 15 mmol via INTRAVENOUS
  Filled 2021-03-13: qty 5

## 2021-03-13 MED ORDER — GLUCERNA 1.5 CAL PO LIQD
1000.0000 mL | ORAL | Status: DC
  Administered 2021-03-13 – 2021-03-15 (×3): 1000 mL
  Filled 2021-03-13 (×8): qty 1000

## 2021-03-13 MED ORDER — LORAZEPAM 2 MG/ML IJ SOLN
1.0000 mg | INTRAMUSCULAR | Status: DC | PRN
  Administered 2021-03-13 – 2021-03-15 (×17): 2 mg via INTRAVENOUS
  Administered 2021-03-15: 20:00:00 1 mg via INTRAVENOUS
  Filled 2021-03-13 (×19): qty 1

## 2021-03-13 MED ORDER — VITAL HIGH PROTEIN PO LIQD: 1000.0000 mL | ORAL | Status: DC

## 2021-03-13 MED ORDER — POTASSIUM CHLORIDE 20 MEQ PO PACK
20.0000 meq | PACK | Freq: Once | ORAL | Status: AC
  Administered 2021-03-13: 20 meq
  Filled 2021-03-13: qty 1

## 2021-03-13 MED ORDER — DEXTROSE 10 % IV SOLN: INTRAVENOUS | Status: DC

## 2021-03-13 MED ORDER — FREE WATER
150.0000 mL | Status: DC
  Administered 2021-03-13 – 2021-03-15 (×14): 150 mL

## 2021-03-13 MED ORDER — ACETAMINOPHEN 160 MG/5ML PO SOLN
650.0000 mg | ORAL | Status: DC | PRN
  Administered 2021-03-13 – 2021-03-14 (×2): 650 mg
  Filled 2021-03-13 (×2): qty 20.3

## 2021-03-13 MED ORDER — LABETALOL HCL 5 MG/ML IV SOLN
20.0000 mg | INTRAVENOUS | Status: DC | PRN
  Administered 2021-03-14 (×2): 20 mg via INTRAVENOUS
  Filled 2021-03-13 (×2): qty 4

## 2021-03-13 MED ORDER — PROSOURCE TF PO LIQD
45.0000 mL | Freq: Two times a day (BID) | ORAL | Status: DC
  Administered 2021-03-13: 45 mL
  Filled 2021-03-13: qty 45

## 2021-03-13 MED ORDER — POTASSIUM CHLORIDE 10 MEQ/100ML IV SOLN
10.0000 meq | INTRAVENOUS | Status: AC
  Administered 2021-03-13 (×2): 10 meq via INTRAVENOUS
  Filled 2021-03-13 (×2): qty 100

## 2021-03-13 NOTE — Progress Notes (Signed)
Inpatient Diabetes Program Recommendations  AACE/ADA: New Consensus Statement on Inpatient Glycemic Control (2015)  Target Ranges:  Prepandial:   less than 140 mg/dL      Peak postprandial:   less than 180 mg/dL (1-2 hours)      Critically ill patients:  140 - 180 mg/dL    Latest Reference Range & Units 03/11/21 23:36 03/12/21 03:20 03/12/21 08:11 03/12/21 12:01 03/12/21 15:57 03/12/21 19:13  Glucose-Capillary 70 - 99 mg/dL 361 (H)  2 units Novolog  135 (H)  2 units Novolog  110 (H) 91    8 units Levemir @1038  74 84    8 units Levemir @2217     Latest Reference Range & Units 03/12/21 23:12 03/12/21 23:34 03/13/21 03:16  Glucose-Capillary 70 - 99 mg/dL 41 (LL) 03/14/21 (H) 43 (LL)      Home DM Meds: Lantus 10 units QHS       Novolin Relion 70/30 Insulin 40 units AM/ 20 units Noon/ 30 units PM   Current Orders: Levemir 8 units BID      Novolog 2-4-6 units Q4 hours     Hypoglycemia this AM--D5% IVF changed to D10% IVF this AM   MD- Please consider reducing the Levemir insulin to 5 units BID     --Will follow patient during hospitalization--  443 RN, MSN, CDE Diabetes Coordinator Inpatient Glycemic Control Team Team Pager: [phone] (8a-5p)

## 2021-03-13 NOTE — Progress Notes (Signed)
Initial Nutrition Assessment  DOCUMENTATION CODES:   Not applicable  INTERVENTION:  - will order TF regimen: Glucerna 1.5 @ 25 ml/hr to advance by 10 ml every 12 hours to reach goal rate of 65 ml/hr with 150 ml free water every 4 hours. - at goal rate this regimen will provide 2340 kcal, 129 grams protein, 25 grams fiber, and 2084 ml free water.  - diet advancement as medically feasible.    NUTRITION DIAGNOSIS:   Inadequate oral intake related to acute illness, lethargy/confusion, inability to eat as evidenced by NPO status.  GOAL:   Patient will meet greater than or equal to 90% of their needs  MONITOR:   TF tolerance, Diet advancement, Labs, Weight trends  REASON FOR ASSESSMENT:   Consult Enteral/tube feeding initiation and management  ASSESSMENT:   35 year old man with medical history of type 1 DM, borderline personality disorder, and depression. He presented to the ED due to hyperglycemia and was found to be in DKA. He reported running out of insulin 1-2 days prior. Urine tox screen was positive for amphetamines.  Patient discussed in rounds and one-on-one with RN. Patient's mom was visiting earlier today but has since left and at that time had informed staff of plan to not return today.   Patient laying in bed with sitter at bedside. Patient is in wrist restraints and failed trial off restraints following rounds. Small bore NGT placed in R nare yesterday afternoon (gastric). He has been NPO since admission.  Patient has not been seen by a [place] RD since 06/2014. Weight on 12/13 was 185 lb and weight on 10/23/20 was 187 lb. This indicates 2 lb weight loss in 5.5 months.    Labs reviewed; CBGs: 43, 149, 77, and 98 mg/dl, K: 3 mmol/l, Cl: 622 mmol/l, Ca: 8 mg/dl.  Medications reviewed; 1 mg folvite/day, 15 ml multivitamin/day, 20 mEq Klor-Con x1 dose 12/16, 40 mEq Klor-Con x2 doses 12/15, 10 mEq IV KCl x4 runs 12/15 and x2 runs 12/16, 15 mmol IV KPhos x1 run 12/16, 30  mmol IV KPhos x1 run 12/15, 100 mg IV thiamine/day.   IVF; D10 @ 75 ml/hr (612 kcal/24 hours).    NUTRITION - FOCUSED PHYSICAL EXAM:  Flowsheet Row Most Recent Value  Orbital Region No depletion  Upper Arm Region Mild depletion  Thoracic and Lumbar Region Unable to assess  Buccal Region No depletion  Temple Region No depletion  Clavicle Bone Region Mild depletion  Clavicle and Acromion Bone Region No depletion  Scapular Bone Region Unable to assess  Dorsal Hand Unable to assess  [bilateral mittens]  Patellar Region No depletion  Anterior Thigh Region No depletion  Posterior Calf Region No depletion  Edema (RD Assessment) Mild  [BLE]  Hair Reviewed  Eyes Unable to assess  Mouth Unable to assess  Skin Reviewed  Nails Unable to assess       Diet Order:   Diet Order             Diet NPO time specified  Diet effective now                   EDUCATION NEEDS:   Not appropriate for education at this time  Skin:  Skin Assessment: Reviewed RN Assessment  Last BM:  PTA/unknown  Height:   Ht Readings from Last 1 Encounters:  03/10/21 5\' 10"  (1.778 m)    Weight:   Wt Readings from Last 1 Encounters:  03/10/21 83.8 kg  Estimated Nutritional Needs:  Kcal:  2200-2500 kcal Protein:  110-125 grams Fluid:  >/= 2.3 L/day     Trenton Gammon, MS, RD, LDN, CNSC Inpatient Clinical Dietitian RD pager # available in AMION  After hours/weekend pager # available in [facility]

## 2021-03-13 NOTE — Progress Notes (Signed)
eLink Physician-Brief Progress Note Patient Name: Raymond [name] DOB: 02/22/1986 MRN: [id number]   Date of Service  03/13/2021  HPI/Events of Note  HTN'ive  eICU Interventions  Added PRN labetalol      Intervention Category Intermediate Interventions: Hypertension - evaluation and management  Jacinta Shoe 03/13/2021, 9:57 PM

## 2021-03-13 NOTE — Progress Notes (Signed)
eLink Physician-Brief Progress Note Patient Name: Raymond [name] DOB: 1985/10/16 MRN: [id number]   Date of Service  03/13/2021  HPI/Events of Note  Blood sugar 43 mg / dl despite D 5 / 7.68 %  gtt at 75 ml /hour.  eICU Interventions  Infusion changed to D 10 %  @ 75 ml / hour.        [name] 03/13/2021, 3:45 AM

## 2021-03-13 NOTE — Progress Notes (Signed)
[facility] ADULT ICU REPLACEMENT PROTOCOL   The patient does apply for the [facility] Adult ICU Electrolyte Replacment Protocol based on the criteria listed below:   1.Exclusion criteria: TCTS patients, ECMO patients, and Dialysis patients 2. Is GFR >/= 30 ml/min? Yes.    Patient's GFR today is >60 3. Is SCr </= 2? Yes.   Patient's SCr is 0.89 mg/dL 4. Did SCr increase >/= 0.5 in 24 hours? No. 5.Pt's weight >40kg  Yes.   6. Abnormal electrolyte(s):  K 3.0, Phos 2.3  7. Electrolytes replaced per protocol 8.  Call MD STAT for K+ </= 2.5, Phos </= 1, or Mag </= 1 Physician:  Shawn Stall R Shirl Weir 03/13/2021 4:21 AM

## 2021-03-13 NOTE — Progress Notes (Signed)
Bladder scan showed 587 cc urine in bladder. Per protocol, in-and-out catheterization done by RN and NT3. 850 mLs of clear yellow urine drained from bladder.

## 2021-03-13 NOTE — Progress Notes (Signed)
NAME:  Raymond [name], MRN:  [id number], DOB:  10/01/1985, LOS: 3 ADMISSION DATE:  03/10/2021, CONSULTATION DATE:  03/10/21 REFERRING MD:  Dr Blinda Leatherwood EDP, CHIEF COMPLAINT:  DKA   History of Present Illness:  35 yo man with hx of DMI, borderline personality disorder per chart, depression, here with hyperglycemia, DKA.  Ran out of insulin 1-2 days ago.  Blood sugar has improved from 530 down to 275.  Initial PH 6.952/15/47 on VBG  - no improvement in follow up pH Anion gap at least 26 (HCO3 <7)  BHOB high  WBC 16.3 Satting 100%  Was agitated, received ativan 1mg  2mg  at 3am, now sleepy but arousable.   Hypothermic 95.1 at 4am.    In ED given fluids 3L then 125 per hours.  Started insulin gtt.   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   Pertinent  Medical History  Dm1 Depression Substance abuse  Significant Hospital Events: Including procedures, antibiotic start and stop dates in addition to other pertinent events   Still gets agitated whenever he arouses  Interim History / Subjective:   Still in restraints. On pressors overnight due to sedation .   Objective   Blood pressure 110/74, pulse (!) 58, temperature 97.8 F (36.6 C), temperature source Axillary, resp. rate 13, height 5\' 10"  (1.778 m), weight 83.8 kg, SpO2 100 %.        Intake/Output Summary (Last 24 hours) at 03/13/2021 Last data filed at 03/13/2021 03/15/2021 Gross per 24 hour  Intake 2856.98 ml  Output 2000 ml  Net 856.98 ml   Filed Weights   03/10/21 0800 03/10/21 0811 03/10/21 1100  Weight: 81.6 kg 81.6 kg 83.8 kg    Examination: General: Young gentleman, does not appear to be in distress HENT: Moist oral mucosa  Lungs: Clear breath sounds Cardiovascular: S1-S2 appreciated Abdomen: Soft, nontender Extremities: multiple abrasions  Neuro: somnolent, awakes up if simulated   Resolved Hospital Problem list     Assessment &  Plan:   Diabetes ketoacidosis Type 1 diabetes-was out of insulin for couple of days -On subcu insulin -Off bicarb -Off endotool  Metabolic encephalopathy secondary to DKA and amphetamine abuse -Still in a state of detoxification -Continue Precedex -As needed Ativan  No clear source of infection Continue to monitor leukocytosis -Completed 5 days of antibiotics  Amphetamine abuse -Counseling when able  Acute kidney injury -Monitor closely  Urinary retention -Monitor -may require catheterization  Remains a danger to himself -Renew restraints 03/12/21 (right click and "Reselect all SmartList Selections" daily)   Diet/type: NPO DVT prophylaxis: LMWH GI prophylaxis: PPI Lines: N/A Foley:  N/A Code Status:  full code Last date of multidisciplinary goals of care discussion [   ]  Labs   CBC: Recent Labs  Lab 03/10/21 0246 03/12/21 1203 03/13/21 0247  WBC 16.3* 6.5 6.0  NEUTROABS 14.7*  --   --   HGB 13.9 11.6* 10.9*  HCT 44.8 34.2* 32.5*  MCV 94.7 87.7 88.3  PLT 530* 274 252    Basic Metabolic Panel: Recent Labs  Lab 03/10/21 1437 03/10/21 1737 03/10/21 2125 03/12/21 1203 03/12/21 2247 03/13/21 0247  NA 142 141 142 143  --  145  K 3.9 3.7 3.4* 2.6* 3.1* 3.0*  CL 117* 116* 116* 111  --  114*  CO2 8* 13* 14* 25  --  24  GLUCOSE 167* 159* 204* 94  --  57*  BUN 29* 28* 27* 11  --  9  CREATININE 1.91* 1.73* 1.72* 0.79  --  0.89  CALCIUM 7.1* 7.1* 7.1* 7.9*  --  8.0*  MG  --   --   --   --   --  2.2  PHOS  --   --   --  1.5*  --  2.3*   GFR: Estimated Creatinine Clearance: 119.6 mL/min (by C-G formula based on SCr of 0.89 mg/dL). Recent Labs  Lab 03/10/21 0246 03/10/21 0636 03/10/21 0835 03/11/21 0245 03/12/21 0248 03/12/21 1203 03/13/21 0247  PROCALCITON  --   --   --  1.95 0.78  --   --   WBC 16.3*  --   --   --   --  6.5 6.0  LATICACIDVEN  --  1.1 1.0  --   --   --   --     Liver Function Tests: Recent Labs  Lab  03/10/21 0246  AST 34  ALT 55*  ALKPHOS 167*  BILITOT 1.9*  PROT 7.6  ALBUMIN 3.5   No results for input(s): LIPASE, AMYLASE in the last 168 hours. Recent Labs  Lab 03/12/21 1208  AMMONIA 51*    ABG    Component Value Date/Time   PHART 7.371 03/10/2021 2310   PCO2ART 28.3 (L) 03/10/2021 2310   PO2ART 94.5 03/10/2021 2310   HCO3 16.5 (L) 03/10/2021 2310   TCO2 7 (L) 10/04/2018 1711   ACIDBASEDEF 7.8 (H) 03/10/2021 2310   O2SAT 98.4 03/10/2021 2310     Coagulation Profile: Recent Labs  Lab 03/10/21 0630  INR 1.0    Cardiac Enzymes: No results for input(s): CKTOTAL, CKMB, CKMBINDEX, TROPONINI in the last 168 hours.  HbA1C: Hgb A1c MFr Bld  Date/Time Value Ref Range Status  03/10/2021 02:55 AM 10.8 (H) 4.8 - 5.6 % Final    Comment:    (NOTE) Pre diabetes:          5.7%-6.4%  Diabetes:              >6.4%  Glycemic control for   <7.0% adults with diabetes   10/05/2018 06:37 PM 12.1 (H) 4.8 - 5.6 % Final    Comment:    (NOTE)         Prediabetes: 5.7 - 6.4         Diabetes: >6.4         Glycemic control for adults with diabetes: <7.0     CBG: Recent Labs  Lab 03/12/21 2312 03/12/21 2334 03/13/21 0316 03/13/21 0342 03/13/21 0744  GLUCAP 41* 143* 43* 149* 77    Review of Systems:   Patient is encephalopathic and/or intubated. Therefore history has been obtained from chart review.    Past Medical History:  He,  has a past medical history of Borderline personality disorder (HCC), Diabetes mellitus without complication (HCC), and Gallstones.   Surgical History:   Past Surgical History:  Procedure Laterality Date   CHOLECYSTECTOMY       Social History:   reports that he has never smoked. His smokeless tobacco use includes chew. He reports current alcohol use. He reports current drug use. Drugs: Other-see comments and Methamphetamines.   Family History:  His family history is not on file.   Allergies Allergies  Allergen Reactions    Ibuprofen Anaphylaxis and Swelling    Facial swelling   Tramadol Other (See Comments)    Hypoglycemic Shock     Home Medications  Prior to Admission medications  Medication Sig Start Date End Date Taking? Authorizing Provider  atorvastatin (LIPITOR) 10 MG tablet Take 10 mg by mouth at bedtime. 11/30/19   [provider]  LANTUS SOLOSTAR 100 UNIT/ML Solostar Pen Inject 10 Units into the skin at bedtime. 01/06/21   [provider]  lisinopril (ZESTRIL) 10 MG tablet Take 1 tablet (10 mg total) by mouth daily. 02/04/21 03/10/22  Linwood Dibbles, MD  NOVOLIN 70/30 RELION (70-30) 100 UNIT/ML injection Inject 20-40 Units into the skin See admin instructions. Inject 40 units into the skin in the morning, 20 units at noon, 30 units at bedtime 07/06/18   [provider]    The patient is critically ill with multiple organ systems failure and requires high complexity decision making for assessment and support, frequent evaluation and titration of therapies, application of advanced monitoring technologies and extensive interpretation of multiple databases. Critical Care Time devoted to patient care services described in this note independent of APP/resident time (if applicable)  is 30 minutes.   Virl Diamond MD  Pulmonary Critical Care Personal pager: See Amion If unanswered, please page CCM On-call: #[phone]

## 2021-03-13 NOTE — Progress Notes (Addendum)
Due to ongoing hypoglycemia, 1000 dose of Levemir not given by this RN per Dr Trena Platt verbal orders. RN will continue to closely monitor patient's CBG.

## 2021-03-14 LAB — GLUCOSE, CAPILLARY
Glucose-Capillary: 244 mg/dL — ABNORMAL HIGH (ref 70–99)
Glucose-Capillary: 209 mg/dL — ABNORMAL HIGH (ref 70–99)
Glucose-Capillary: 146 mg/dL — ABNORMAL HIGH (ref 70–99)
Glucose-Capillary: 203 mg/dL — ABNORMAL HIGH (ref 70–99)
Glucose-Capillary: 223 mg/dL — ABNORMAL HIGH (ref 70–99)
Glucose-Capillary: 164 mg/dL — ABNORMAL HIGH (ref 70–99)

## 2021-03-14 LAB — CBC
HCT: 36.2 % — ABNORMAL LOW (ref 39.0–52.0)
Hemoglobin: 12.3 g/dL — ABNORMAL LOW (ref 13.0–17.0)
MCH: 29.6 pg (ref 26.0–34.0)
MCHC: 34 g/dL (ref 30.0–36.0)
MCV: 87.2 fL (ref 80.0–100.0)
Platelets: 247 10*3/uL (ref 150–400)
RBC: 4.15 MIL/uL — ABNORMAL LOW (ref 4.22–5.81)
RDW: 13.5 % (ref 11.5–15.5)
WBC: 5.1 10*3/uL (ref 4.0–10.5)
nRBC: 0 % (ref 0.0–0.2)

## 2021-03-14 LAB — BASIC METABOLIC PANEL
Anion gap: 10 (ref 5–15)
BUN: 10 mg/dL (ref 6–20)
CO2: 23 mmol/L (ref 22–32)
Calcium: 8.3 mg/dL — ABNORMAL LOW (ref 8.9–10.3)
Chloride: 106 mmol/L (ref 98–111)
Creatinine, Ser: 0.99 mg/dL (ref 0.61–1.24)
GFR, Estimated: >60 mL/min (ref >60)
Glucose, Bld: 316 mg/dL — ABNORMAL HIGH (ref 70–99)
Potassium: 3.3 mmol/L — ABNORMAL LOW (ref 3.5–5.1)
Sodium: 139 mmol/L (ref 135–145)

## 2021-03-14 LAB — MAGNESIUM
Magnesium: 2 mg/dL (ref 1.7–2.4)
Magnesium: 2.1 mg/dL (ref 1.7–2.4)

## 2021-03-14 LAB — PHOSPHORUS
Phosphorus: 2.9 mg/dL (ref 2.5–4.6)
Phosphorus: 3.6 mg/dL (ref 2.5–4.6)

## 2021-03-14 MED ORDER — INSULIN DETEMIR 100 UNIT/ML ~~LOC~~ SOLN
5.0000 [IU] | Freq: Two times a day (BID) | SUBCUTANEOUS | Status: DC
  Administered 2021-03-14 – 2021-03-15 (×3): 5 [IU] via SUBCUTANEOUS
  Filled 2021-03-14 (×4): qty 0.05

## 2021-03-14 MED ORDER — INSULIN ASPART 100 UNIT/ML IJ SOLN: 0.0000 [IU] | INTRAMUSCULAR | Status: DC

## 2021-03-14 MED ORDER — POTASSIUM CHLORIDE 20 MEQ PO PACK
20.0000 meq | PACK | ORAL | Status: AC
  Administered 2021-03-14 (×2): 20 meq
  Filled 2021-03-14 (×2): qty 1

## 2021-03-14 MED ORDER — POTASSIUM CHLORIDE 10 MEQ/100ML IV SOLN
INTRAVENOUS | Status: AC
  Filled 2021-03-14: qty 100

## 2021-03-14 MED ORDER — CLONAZEPAM 1 MG PO TABS
2.0000 mg | ORAL_TABLET | Freq: Two times a day (BID) | ORAL | Status: DC
  Administered 2021-03-14 – 2021-03-16 (×4): 2 mg
  Filled 2021-03-14 (×4): qty 2

## 2021-03-14 MED ORDER — POTASSIUM CHLORIDE 10 MEQ/100ML IV SOLN
10.0000 meq | INTRAVENOUS | Status: AC
  Administered 2021-03-14 (×4): 10 meq via INTRAVENOUS
  Filled 2021-03-14 (×3): qty 100

## 2021-03-14 MED ORDER — INSULIN ASPART 100 UNIT/ML IJ SOLN
0.0000 [IU] | INTRAMUSCULAR | Status: DC
  Administered 2021-03-14: 5 [IU] via SUBCUTANEOUS
  Administered 2021-03-14: 11 [IU] via SUBCUTANEOUS
  Administered 2021-03-14: 5 [IU] via SUBCUTANEOUS

## 2021-03-14 MED ORDER — INSULIN ASPART 100 UNIT/ML IJ SOLN
0.0000 [IU] | INTRAMUSCULAR | Status: DC
  Administered 2021-03-14 (×2): 3 [IU] via SUBCUTANEOUS
  Administered 2021-03-14: 1 [IU] via SUBCUTANEOUS
  Administered 2021-03-14 – 2021-03-15 (×2): 2 [IU] via SUBCUTANEOUS
  Administered 2021-03-15: 16:00:00 5 [IU] via SUBCUTANEOUS
  Administered 2021-03-15: 12:00:00 2 [IU] via SUBCUTANEOUS
  Administered 2021-03-15: 23:00:00 1 [IU] via SUBCUTANEOUS
  Administered 2021-03-15: 08:00:00 2 [IU] via SUBCUTANEOUS
  Administered 2021-03-16 (×2): 1 [IU] via SUBCUTANEOUS
  Administered 2021-03-16: 10:00:00 2 [IU] via SUBCUTANEOUS
  Administered 2021-03-17: 17:00:00 7 [IU] via SUBCUTANEOUS
  Administered 2021-03-17: 2 [IU] via SUBCUTANEOUS
  Administered 2021-03-17: 08:00:00 1 [IU] via SUBCUTANEOUS
  Administered 2021-03-17: 12:00:00 5 [IU] via SUBCUTANEOUS

## 2021-03-14 NOTE — Progress Notes (Incomplete)
Inpatient Diabetes Program Recommendations  AACE/ADA: New Consensus Statement on Inpatient Glycemic Control (2015)  Target Ranges:  Prepandial:   less than 140 mg/dL      Peak postprandial:   less than 180 mg/dL (1-2 hours)      Critically ill patients:  140 - 180 mg/dL   Lab Results  Component Value Date   GLUCAP 209 (H) 03/14/2021   HGBA1C 10.8 (H) 03/10/2021    Review of Glycemic Control  Latest Reference Range & Units 03/13/21 19:08 03/13/21 23:22 03/14/21 04:33 03/14/21 08:27  Glucose-Capillary 70 - 99 mg/dL 256 (H) 389 (H) 373 (H) 209 (H)  (H): Data is abnormally high Home DM Meds: Lantus 10 units QHS                             Novolin Relion 70/30 Insulin 40 units AM/ 20 units Noon/ 30 units PM     Current Orders:  Novolog moderate Q4 hours   Inpatient Diabetes Program Recommendations:    Please consider restarting Levemir 5 units bid and reduce Novolog correction to sensitive.    Thanks,  Beryl Meager, RN, BC-ADM Inpatient Diabetes Coordinator Pager [phone]  (8a-5p)

## 2021-03-14 NOTE — Progress Notes (Signed)
NAME:  Raymond [name], MRN:  [id number], DOB:  03-Jul-1985, LOS: 4 ADMISSION DATE:  03/10/2021, CONSULTATION DATE:  03/10/21 REFERRING MD:  Dr Blinda Leatherwood EDP, CHIEF COMPLAINT:  DKA   History of Present Illness:  35 yo man with hx of DMI, borderline personality disorder per chart, depression, here with hyperglycemia, DKA.  Ran out of insulin 1-2 days ago.  Blood sugar has improved from 530 down to 275.  Initial PH 6.952/15/47 on VBG  - no improvement in follow up pH Anion gap at least 26 (HCO3 <7)  BHOB high  WBC 16.3 Satting 100%  Was agitated, received ativan 1mg  2mg  at 3am, now sleepy but arousable.   Hypothermic 95.1 at 4am.    In ED given fluids 3L then 125 per hours.  Started insulin gtt.   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   Pertinent  Medical History  Dm1 Depression Substance abuse  Significant Hospital Events: Including procedures, antibiotic start and stop dates in addition to other pertinent events   12/17 Still gets agitated whenever he arouses  Interim History / Subjective:   Still in restraints. On pressors overnight due to sedation .   Objective   Blood pressure 106/77, pulse 73, temperature 97.9 F (36.6 C), temperature source Axillary, resp. rate 15, height 5\' 10"  (1.778 m), weight 83.8 kg, SpO2 96 %.        Intake/Output Summary (Last 24 hours) at 03/14/2021 0833 Last data filed at 03/14/2021 0700 Gross per 24 hour  Intake 1824.52 ml  Output 2250 ml  Net -425.48 ml   Filed Weights   03/10/21 0800 03/10/21 0811 03/10/21 1100  Weight: 81.6 kg 81.6 kg 83.8 kg    Examination: General: Young gentleman, does not appear to be in distress HENT: Moist oral mucosa, cortrak in place Lungs: Clear breath sounds Cardiovascular: S1-S2 appreciated Abdomen: Bowel sounds appreciated Extremities: multiple abrasions  Neuro: somnolent, awakes up if simulated   Resolved Hospital Problem  list     Assessment & Plan:   Diabetic ketoacidosis Type 1 diabetes-was out of insulin for couple of days -Currently on subcutaneous insulin -Off Endo tool  Metabolic encephalopathy secondary to DKA and amphetamine abuse -Detoxify -Continue Precedex, wean down as tolerated -On Ativan as needed -Dose of Klonopin increased to 2 twice daily  No clear source of infection -Completed 5 days of antibiotics  Amphetamine abuse -Counseling when able  Acute kidney injury -Continue to monitor closely  Urinary retention -Monitor  Patient remains a danger to himself -Renew restraints 03/12/21 (right click and "Reselect all SmartList Selections" daily)   Diet/type: tubefeeds DVT prophylaxis: LMWH GI prophylaxis: PPI Lines: N/A Foley:  N/A Code Status:  full code Last date of multidisciplinary goals of care discussion [   ]  Labs   CBC: Recent Labs  Lab 03/10/21 0246 03/12/21 1203 03/13/21 0247 03/14/21 0307  WBC 16.3* 6.5 6.0 5.1  NEUTROABS 14.7*  --   --   --   HGB 13.9 11.6* 10.9* 12.3*  HCT 44.8 34.2* 32.5* 36.2*  MCV 94.7 87.7 88.3 87.2  PLT 530* 274 252 247    Basic Metabolic Panel: Recent Labs  Lab 03/10/21 1737 03/10/21 2125 03/12/21 1203 03/12/21 2247 03/13/21 0247 03/13/21 1126 03/13/21 1700 03/14/21 0307  NA 141 142 143  --  145  --   --  139  K 3.7 3.4* 2.6* 3.1* 3.0*  --   --  3.3*  CL 116* 116* 111  --  114*  --   --  106  CO2 13* 14* 25  --  24  --   --  23  GLUCOSE 159* 204* 94  --  57*  --   --  316*  BUN 28* 27* 11  --  9  --   --  10  CREATININE 1.73* 1.72* 0.79  --  0.89  --   --  0.99  CALCIUM 7.1* 7.1* 7.9*  --  8.0*  --   --  8.3*  MG  --   --   --   --  2.2 2.0 1.9 2.0  PHOS  --   --  1.5*  --  2.3* 3.2 2.6 2.9   GFR: Estimated Creatinine Clearance: 107.5 mL/min (by C-G formula based on SCr of 0.99 mg/dL). Recent Labs  Lab 03/10/21 0246 03/10/21 0636 03/10/21 0835 03/11/21 0245 03/12/21 0248  03/12/21 1203 03/13/21 0247 03/14/21 0307  PROCALCITON  --   --   --  1.95 0.78  --   --   --   WBC 16.3*  --   --   --   --  6.5 6.0 5.1  LATICACIDVEN  --  1.1 1.0  --   --   --   --   --     Liver Function Tests: Recent Labs  Lab 03/10/21 0246  AST 34  ALT 55*  ALKPHOS 167*  BILITOT 1.9*  PROT 7.6  ALBUMIN 3.5   No results for input(s): LIPASE, AMYLASE in the last 168 hours. Recent Labs  Lab 03/12/21 1208  AMMONIA 51*    ABG    Component Value Date/Time   PHART 7.371 03/10/2021 2310   PCO2ART 28.3 (L) 03/10/2021 2310   PO2ART 94.5 03/10/2021 2310   HCO3 16.5 (L) 03/10/2021 2310   TCO2 7 (L) 10/04/2018 1711   ACIDBASEDEF 7.8 (H) 03/10/2021 2310   O2SAT 98.4 03/10/2021 2310     Coagulation Profile: Recent Labs  Lab 03/10/21 0630  INR 1.0    Cardiac Enzymes: No results for input(s): CKTOTAL, CKMB, CKMBINDEX, TROPONINI in the last 168 hours.  HbA1C: Hgb A1c MFr Bld  Date/Time Value Ref Range Status  03/10/2021 02:55 AM 10.8 (H) 4.8 - 5.6 % Final    Comment:    (NOTE) Pre diabetes:          5.7%-6.4%  Diabetes:              >6.4%  Glycemic control for   <7.0% adults with diabetes   10/05/2018 06:37 PM 12.1 (H) 4.8 - 5.6 % Final    Comment:    (NOTE)         Prediabetes: 5.7 - 6.4         Diabetes: >6.4         Glycemic control for adults with diabetes: <7.0     CBG: Recent Labs  Lab 03/13/21 1550 03/13/21 1908 03/13/21 2322 03/14/21 0433 03/14/21 0827  GLUCAP 156* 196* 330* 244* 209*    Review of Systems:   Patient is encephalopathic and/or intubated. Therefore history has been obtained from chart review.    Past Medical History:  He,  has a past medical history of Borderline personality disorder (HCC), Diabetes mellitus without complication (HCC), and Gallstones.   Surgical History:   Past Surgical History:  Procedure Laterality Date   CHOLECYSTECTOMY       Social History:   reports that he  has never smoked. His smokeless  tobacco use includes chew. He reports current alcohol use. He reports current drug use. Drugs: Other-see comments and Methamphetamines.   Family History:  His family history is not on file.   Allergies Allergies  Allergen Reactions   Ibuprofen Anaphylaxis and Swelling    Facial swelling   Tramadol Other (See Comments)    Hypoglycemic Shock    The patient is critically ill with multiple organ systems failure and requires high complexity decision making for assessment and support, frequent evaluation and titration of therapies, application of advanced monitoring technologies and extensive interpretation of multiple databases. Critical Care Time devoted to patient care services described in this note independent of APP/resident time (if applicable)  is 30 minutes.   Virl Diamond MD [facility] Personal pager: See Amion If unanswered, please page CCM On-call: #[phone]

## 2021-03-14 NOTE — Progress Notes (Signed)
[facility] ADULT ICU REPLACEMENT PROTOCOL   The patient does apply for the [facility] Adult ICU Electrolyte Replacment Protocol based on the criteria listed below:   1.Exclusion criteria: TCTS patients, ECMO patients, and Dialysis patients 2. Is GFR >/= 30 ml/min? Yes.    Patient's GFR today is >60 3. Is SCr </= 2? Yes.   Patient's SCr is 0.99 mg/dL 4. Did SCr increase >/= 0.5 in 24 hours? No. 5.Pt's weight >40kg  Yes.   6. Abnormal electrolyte(s):  K 3.3  7. Electrolytes replaced per protocol 8.  Call MD STAT for K+ </= 2.5, Phos </= 1, or Mag </= 1 Physician:  R. Leota Sauers R Sahith Nurse 03/14/2021 4:32 AM

## 2021-03-15 LAB — BASIC METABOLIC PANEL
Anion gap: 6 (ref 5–15)
BUN: 7 mg/dL (ref 6–20)
CO2: 27 mmol/L (ref 22–32)
Calcium: 8.5 mg/dL — ABNORMAL LOW (ref 8.9–10.3)
Chloride: 106 mmol/L (ref 98–111)
Creatinine, Ser: 0.89 mg/dL (ref 0.61–1.24)
GFR, Estimated: >60 mL/min (ref >60)
Glucose, Bld: 162 mg/dL — ABNORMAL HIGH (ref 70–99)
Potassium: 3.6 mmol/L (ref 3.5–5.1)
Sodium: 139 mmol/L (ref 135–145)

## 2021-03-15 LAB — GLUCOSE, CAPILLARY
Glucose-Capillary: 105 mg/dL — ABNORMAL HIGH (ref 70–99)
Glucose-Capillary: 158 mg/dL — ABNORMAL HIGH (ref 70–99)
Glucose-Capillary: 199 mg/dL — ABNORMAL HIGH (ref 70–99)
Glucose-Capillary: 275 mg/dL — ABNORMAL HIGH (ref 70–99)
Glucose-Capillary: 185 mg/dL — ABNORMAL HIGH (ref 70–99)
Glucose-Capillary: 132 mg/dL — ABNORMAL HIGH (ref 70–99)

## 2021-03-15 LAB — CBC
HCT: 34.9 % — ABNORMAL LOW (ref 39.0–52.0)
Hemoglobin: 11.7 g/dL — ABNORMAL LOW (ref 13.0–17.0)
MCH: 29.5 pg (ref 26.0–34.0)
MCHC: 33.5 g/dL (ref 30.0–36.0)
MCV: 87.9 fL (ref 80.0–100.0)
Platelets: 319 10*3/uL (ref 150–400)
RBC: 3.97 MIL/uL — ABNORMAL LOW (ref 4.22–5.81)
RDW: 13.2 % (ref 11.5–15.5)
WBC: 6.5 10*3/uL (ref 4.0–10.5)
nRBC: 0 % (ref 0.0–0.2)

## 2021-03-15 LAB — MAGNESIUM: Magnesium: 1.8 mg/dL (ref 1.7–2.4)

## 2021-03-15 LAB — CULTURE, BLOOD (ROUTINE X 2)
Culture: NO GROWTH
Culture: NO GROWTH
Special Requests: ADEQUATE

## 2021-03-15 LAB — PHOSPHORUS: Phosphorus: 3.1 mg/dL (ref 2.5–4.6)

## 2021-03-15 MED ORDER — MAGNESIUM SULFATE 2 GM/50ML IV SOLN
2.0000 g | Freq: Once | INTRAVENOUS | Status: AC
  Administered 2021-03-15: 13:00:00 2 g via INTRAVENOUS
  Filled 2021-03-15: qty 50

## 2021-03-15 MED ORDER — POTASSIUM CHLORIDE 20 MEQ PO PACK
40.0000 meq | PACK | Freq: Once | ORAL | Status: AC
  Administered 2021-03-15: 15:00:00 40 meq
  Filled 2021-03-15: qty 2

## 2021-03-15 MED ORDER — POTASSIUM CHLORIDE 10 MEQ/100ML IV SOLN
10.0000 meq | INTRAVENOUS | Status: DC
  Filled 2021-03-15: qty 100

## 2021-03-15 MED ORDER — INSULIN DETEMIR 100 UNIT/ML ~~LOC~~ SOLN
10.0000 [IU] | Freq: Two times a day (BID) | SUBCUTANEOUS | Status: DC
  Administered 2021-03-15 – 2021-03-16 (×3): 10 [IU] via SUBCUTANEOUS
  Filled 2021-03-15 (×3): qty 0.1

## 2021-03-15 MED ORDER — QUETIAPINE FUMARATE 100 MG PO TABS
100.0000 mg | ORAL_TABLET | Freq: Two times a day (BID) | ORAL | Status: DC
Start: 2021-03-15 — End: 2021-03-16
  Administered 2021-03-16: 10:00:00 100 mg
  Filled 2021-03-15: qty 1

## 2021-03-15 NOTE — Progress Notes (Signed)
Pt is requesting to leave AMA, Elink called.

## 2021-03-15 NOTE — Progress Notes (Addendum)
Pt is screaming, demanding his release, and thrashing in the bed, rising up to a sitting position and slamming his head back down. Explained to patient that he is restrained because he is a danger to himself and others. He is not receptive and is not following commands at this time. Will continue to monitor.

## 2021-03-15 NOTE — Progress Notes (Signed)
NAME:  Raymond [name], MRN:  [id number], DOB:  [birth year], LOS: 5 ADMISSION DATE:  03/10/2021, CONSULTATION DATE:  03/10/21 REFERRING MD:  Dr Blinda Leatherwood EDP, CHIEF COMPLAINT:  DKA   History of Present Illness:  35 yo man with hx of DMI, borderline personality disorder per chart, depression, here with hyperglycemia, DKA.  Ran out of insulin 1-2 days ago.  Blood sugar has improved from 530 down to 275.  Initial PH 6.952/15/47 on VBG  - no improvement in follow up pH Anion gap at least 26 (HCO3 <7)  BHOB high  WBC 16.3 Satting 100%  Was agitated, received ativan 1mg  2mg  at 3am, now sleepy but arousable.   Hypothermic 95.1 at 4am.    In ED given fluids 3L then 125 per hours.  Started insulin gtt.   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   Pertinent  Medical History  Dm1 Depression Substance abuse  Significant Hospital Events: Including procedures, antibiotic start and stop dates in addition to other pertinent events   12/17 Still gets agitated whenever he arouses 12/18 Still significantly agitated when he is awake  Interim History / Subjective:   Still in restraints Sedate  Objective   Blood pressure (!) 152/86, pulse 73, temperature 99.6 F (37.6 C), temperature source Axillary, resp. rate 14, height 5\' 10"  (1.778 m), weight 89.2 kg, SpO2 98 %.        Intake/Output Summary (Last 24 hours) at 03/15/2021 1116 Last data filed at 03/15/2021 1100 Gross per 24 hour  Intake 550.03 ml  Output 1797 ml  Net -1246.97 ml   Filed Weights   03/10/21 0811 03/10/21 1100 03/15/21 0424  Weight: 81.6 kg 83.8 kg 89.2 kg    Examination: General: Young gentleman, does not appear to be in distress HENT: Moist oral mucosa, cortrak in place Lungs: Clear breath sounds Cardiovascular: S1-S2 appreciated Abdomen: Bowel sounds appreciated Extremities: multiple abrasions  Neuro: somnolent, awakes up if simulated    Resolved Hospital Problem list     Assessment & Plan:   Diabetic ketoacidosis Type 1 diabetes-off insulin for couple of days -DKA resolved  Metabolic encephalopathy secondary to DKA and amphetamine abuse -Detoxing -On Ativan as needed, Klonopin, Seroquel -Precedex  Amphetamine abuse -Counseling when able  Acute kidney injury -Continue to trend -Avoid nephrotoxic's  Completed 5 days of antibiotics  Patient remains a danger to himself -Renew restraints -Renew sitter  Dose of Seroquel increased 200 twice daily  Risk of decompensation remains significant Best Practice (right click and "Reselect all SmartList Selections" daily)   Diet/type: tubefeeds DVT prophylaxis: LMWH GI prophylaxis: PPI Lines: N/A Foley:  N/A Code Status:  full code Last date of multidisciplinary goals of care discussion [   ]  Labs   CBC: Recent Labs  Lab 03/10/21 0246 03/12/21 1203 03/13/21 0247 03/14/21 0307 03/15/21 0755  WBC 16.3* 6.5 6.0 5.1 6.5  NEUTROABS 14.7*  --   --   --   --   HGB 13.9 11.6* 10.9* 12.3* 11.7*  HCT 44.8 34.2* 32.5* 36.2* 34.9*  MCV 94.7 87.7 88.3 87.2 87.9  PLT 530* 274 252 247 319    Basic Metabolic Panel: Recent Labs  Lab 03/10/21 2125 03/10/21 2125 03/12/21 1203 03/12/21 1203 03/12/21 2247 03/13/21 0247 03/13/21 1126 03/13/21 1700 03/14/21 0307 03/14/21 1700 03/15/21 0755  NA 142  --  143  --   --  145  --   --  139  --  139  K 3.4*  --  2.6*  --  3.1* 3.0*  --   --  3.3*  --  3.6  CL 116*  --  111  --   --  114*  --   --  106  --  106  CO2 14*  --  25  --   --  24  --   --  23  --  27  GLUCOSE 204*  --  94  --   --  57*  --   --  316*  --  162*  BUN 27*  --  11  --   --  9  --   --  10  --  7  CREATININE 1.72*  --  0.79  --   --  0.89  --   --  0.99  --  0.89  CALCIUM 7.1*  --  7.9*  --   --  8.0*  --   --  8.3*  --  8.5*  MG  --   --   --    < >  --  2.2 2.0 1.9 2.0 2.1 1.8  PHOS  --    < > 1.5*  --   --  2.3* 3.2 2.6 2.9 3.6 3.1   <  > = values in this interval not displayed.   GFR: Estimated Creatinine Clearance: 130.3 mL/min (by C-G formula based on SCr of 0.89 mg/dL). Recent Labs  Lab 03/10/21 0636 03/10/21 0835 03/11/21 0245 03/12/21 0248 03/12/21 1203 03/13/21 0247 03/14/21 0307 03/15/21 0755  PROCALCITON  --   --  1.95 0.78  --   --   --   --   WBC  --   --   --   --  6.5 6.0 5.1 6.5  LATICACIDVEN 1.1 1.0  --   --   --   --   --   --     Liver Function Tests: Recent Labs  Lab 03/10/21 0246  AST 34  ALT 55*  ALKPHOS 167*  BILITOT 1.9*  PROT 7.6  ALBUMIN 3.5   No results for input(s): LIPASE, AMYLASE in the last 168 hours. Recent Labs  Lab 03/12/21 1208  AMMONIA 51*    ABG    Component Value Date/Time   PHART 7.371 03/10/2021 2310   PCO2ART 28.3 (L) 03/10/2021 2310   PO2ART 94.5 03/10/2021 2310   HCO3 16.5 (L) 03/10/2021 2310   TCO2 7 (L) 10/04/2018 1711   ACIDBASEDEF 7.8 (H) 03/10/2021 2310   O2SAT 98.4 03/10/2021 2310     Coagulation Profile: Recent Labs  Lab 03/10/21 0630  INR 1.0    Cardiac Enzymes: No results for input(s): CKTOTAL, CKMB, CKMBINDEX, TROPONINI in the last 168 hours.  HbA1C: Hgb A1c MFr Bld  Date/Time Value Ref Range Status  03/10/2021 02:55 AM 10.8 (H) 4.8 - 5.6 % Final    Comment:    (NOTE) Pre diabetes:          5.7%-6.4%  Diabetes:              >6.4%  Glycemic control for   <7.0% adults with diabetes   10/05/2018 06:37 PM 12.1 (H) 4.8 - 5.6 % Final    Comment:    (NOTE)         Prediabetes: 5.7 - 6.4         Diabetes: >6.4         Glycemic control for adults with diabetes: <7.0  CBG: Recent Labs  Lab 03/14/21 1549 03/14/21 2004 03/14/21 2301 03/15/21 0355 03/15/21 0750  GLUCAP 203* 223* 164* 105* 158*    Review of Systems:   Patient is encephalopathic and/or intubated. Therefore history has been obtained from chart review.    Past Medical History:  He,  has a past medical history of Borderline personality disorder (HCC),  Diabetes mellitus without complication (HCC), and Gallstones.   Surgical History:   Past Surgical History:  Procedure Laterality Date   CHOLECYSTECTOMY       Social History:   reports that he has never smoked. His smokeless tobacco use includes chew. He reports current alcohol use. He reports current drug use. Drugs: Other-see comments and Methamphetamines.   Family History:  His family history is not on file.   Allergies Allergies  Allergen Reactions   Ibuprofen Anaphylaxis and Swelling    Facial swelling   Tramadol Other (See Comments)    Hypoglycemic Shock    The patient is critically ill with multiple organ systems failure and requires high complexity decision making for assessment and support, frequent evaluation and titration of therapies, application of advanced monitoring technologies and extensive interpretation of multiple databases. Critical Care Time devoted to patient care services described in this note independent of APP/resident time (if applicable)  is 30 minutes.   Virl Diamond MD [facility] Personal pager: See Amion If unanswered, please page CCM On-call: #[phone]
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
## 2021-03-16 LAB — BASIC METABOLIC PANEL
Anion gap: 5 (ref 5–15)
BUN: 13 mg/dL (ref 6–20)
CO2: 26 mmol/L (ref 22–32)
Calcium: 8.5 mg/dL — ABNORMAL LOW (ref 8.9–10.3)
Chloride: 108 mmol/L (ref 98–111)
Creatinine, Ser: 0.81 mg/dL (ref 0.61–1.24)
GFR, Estimated: >60 mL/min (ref >60)
Glucose, Bld: 132 mg/dL — ABNORMAL HIGH (ref 70–99)
Potassium: 4.1 mmol/L (ref 3.5–5.1)
Sodium: 139 mmol/L (ref 135–145)

## 2021-03-16 LAB — CBC
HCT: 34 % — ABNORMAL LOW (ref 39.0–52.0)
Hemoglobin: 11.2 g/dL — ABNORMAL LOW (ref 13.0–17.0)
MCH: 29.3 pg (ref 26.0–34.0)
MCHC: 32.9 g/dL (ref 30.0–36.0)
MCV: 89 fL (ref 80.0–100.0)
Platelets: 313 10*3/uL (ref 150–400)
RBC: 3.82 MIL/uL — ABNORMAL LOW (ref 4.22–5.81)
RDW: 13.4 % (ref 11.5–15.5)
WBC: 6.7 10*3/uL (ref 4.0–10.5)
nRBC: 0 % (ref 0.0–0.2)

## 2021-03-16 LAB — GLUCOSE, CAPILLARY
Glucose-Capillary: 143 mg/dL — ABNORMAL HIGH (ref 70–99)
Glucose-Capillary: 147 mg/dL — ABNORMAL HIGH (ref 70–99)
Glucose-Capillary: 162 mg/dL — ABNORMAL HIGH (ref 70–99)
Glucose-Capillary: 110 mg/dL — ABNORMAL HIGH (ref 70–99)
Glucose-Capillary: 91 mg/dL (ref 70–99)
Glucose-Capillary: 134 mg/dL — ABNORMAL HIGH (ref 70–99)

## 2021-03-16 LAB — MAGNESIUM: Magnesium: 2.5 mg/dL — ABNORMAL HIGH (ref 1.7–2.4)

## 2021-03-16 LAB — PHOSPHORUS: Phosphorus: 2.8 mg/dL (ref 2.5–4.6)

## 2021-03-16 MED ORDER — POTASSIUM PHOSPHATES 15 MMOLE/5ML IV SOLN: 15.0000 mmol | Freq: Once | INTRAVENOUS | Status: DC

## 2021-03-16 MED ORDER — THIAMINE HCL 100 MG PO TABS
100.0000 mg | ORAL_TABLET | Freq: Every day | ORAL | Status: DC
  Administered 2021-03-17: 10:00:00 100 mg via ORAL
  Filled 2021-03-16: qty 1

## 2021-03-16 MED ORDER — QUETIAPINE FUMARATE 100 MG PO TABS
200.0000 mg | ORAL_TABLET | Freq: Two times a day (BID) | ORAL | Status: DC
  Administered 2021-03-17: 10:00:00 200 mg via ORAL
  Filled 2021-03-16: qty 2

## 2021-03-16 MED ORDER — POTASSIUM CHLORIDE 20 MEQ PO PACK: 40.0000 meq | PACK | ORAL | Status: DC

## 2021-03-16 MED ORDER — QUETIAPINE FUMARATE 100 MG PO TABS: 100.0000 mg | ORAL_TABLET | Freq: Two times a day (BID) | ORAL | Status: DC

## 2021-03-16 MED ORDER — CLONAZEPAM 1 MG PO TABS
2.0000 mg | ORAL_TABLET | Freq: Two times a day (BID) | ORAL | Status: DC
  Administered 2021-03-16: 21:00:00 2 mg via ORAL
  Filled 2021-03-16: qty 2

## 2021-03-16 MED ORDER — ADULT MULTIVITAMIN W/MINERALS CH
1.0000 | ORAL_TABLET | Freq: Every day | ORAL | Status: DC
  Administered 2021-03-16 – 2021-03-17 (×2): 1 via ORAL
  Filled 2021-03-16 (×2): qty 1

## 2021-03-16 MED ORDER — THIAMINE HCL 100 MG/ML IJ SOLN: 100.0000 mg | Freq: Every day | INTRAMUSCULAR | Status: DC

## 2021-03-16 MED ORDER — FOLIC ACID 1 MG PO TABS
1.0000 mg | ORAL_TABLET | Freq: Every day | ORAL | Status: DC
  Administered 2021-03-17: 10:00:00 1 mg via ORAL
  Filled 2021-03-16: qty 1

## 2021-03-16 NOTE — Progress Notes (Signed)
Pt tried to climb out of the bed. When reminded that he can't get out of bed, he told me to stop "catching an attitude" with him. I reminded him of our stipulations for keeping off restraints and asked him what I could do to make him more comfortable. He told me to "back up out of his face" and that if I won't let him leave, it's "your own fault if someone gets hurt". Gave 5mg  haldol and reapplied wrist and ankle restraints. Will continue to monitor.

## 2021-03-16 NOTE — Progress Notes (Addendum)
Patient refused evening dose of Seroquel.     He is calling out for [facility] form. E-Link informed.

## 2021-03-16 NOTE — Progress Notes (Signed)
eLink Physician-Brief Progress Note Patient Name: Raymond [name] DOB: 1985/08/15 MRN: [id number]   Date of Service  03/16/2021  HPI/Events of Note  Agitation - Nursing request to renew restraint and 1:1 safety sitter orders.   eICU Interventions  Plan: Will renew restraint orders X 8 hours. Will renew 1:1 safety sitter X 12 hours.     Intervention Category Major Interventions: Delirium, psychosis, severe agitation - evaluation and management  Keshia Weare Eugene 03/16/2021, 11:44 PM

## 2021-03-16 NOTE — Progress Notes (Signed)
NAME:  Raymond [name], MRN:  [id number], DOB:  May 10, 1985, LOS: 6 ADMISSION DATE:  03/10/2021, CONSULTATION DATE:  03/10/21 REFERRING MD:  Dr Blinda Leatherwood EDP, CHIEF COMPLAINT:  DKA   History of Present Illness:  35 yo man with hx of DMI, borderline personality disorder per chart, depression, here with hyperglycemia, DKA.  Ran out of insulin 1-2 days prior to presentation. Treated with IVF + insulin. Blood sugar has improved from 530 down to 275. Initial PH 6.952/15/47 on VBG  - no improvement in follow up pH. Anion gap at least 26 (HCO3 <7)  BHOB elevated.  Was agitated, received ativan 1mg  2mg  at 3am, now sleepy but arousable.  Hypothermic 95.1 at 4am.  In ED given fluids 3L then 125 per hours.  Started insulin gtt. 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   Pertinent  Medical History  Dm1 Depression Substance abuse  Significant Hospital Events: Including procedures, antibiotic start and stop dates in addition to other pertinent events   12/17 Still gets agitated whenever he arouses 12/18 Still significantly agitated when he is awake, sedate otherwise 12/19 On precedex, weaning. Sitter at bedside.   Interim History / Subjective:  Afebrile  On 1/19 precedex Sitter at bedside  Pt arouses to voice, states he will eat breakfast later   Objective   Blood pressure 114/76, pulse 65, temperature 97.7 F (36.5 C), temperature source Oral, resp. rate 18, height 5\' 10"  (1.778 m), weight 89.2 kg, SpO2 97 %.        Intake/Output Summary (Last 24 hours) at 03/16/2021 Last data filed at 03/16/2021 03/18/2021 Gross per 24 hour  Intake 745.39 ml  Output 2450 ml  Net -1704.61 ml   Filed Weights   03/10/21 0811 03/10/21 1100 03/15/21 0424  Weight: 81.6 kg 83.8 kg 89.2 kg    Examination: General: disheveled adult male lying in bed in NAD HEENT: MM pink/moist, multiple facial piercing's  Neuro: sedate, awakens to voice,  speech clear, MAE CV: s1s2 RRR, no m/r/g PULM: non-labored at rest, lungs bilaterally clear  GI: soft, bsx4 active  Extremities: warm/dry, no edema  Skin: no rashes or lesions, multiple tattoos   Resolved Hospital Problem list     Assessment & Plan:   Type I DM with DKA  -follow glucose  -SSI, sensitive scale  -levemir 10 units BID    Acute Metabolic Encephalopathy secondary to DKA and Amphetamine Abuse -continue klonopin PT BID  -increase seroquel 200 mg BID  -assess EKG now and in am  -PRN ativan, haldol  -wean precedex infusion to off, query at this point if this is baseline behavioral issues  Amphetamine Abuse -cessation counseling when able   Acute Kidney Injury - resolved  In setting of volume depletion  -Trend BMP / urinary output -Replace electrolytes as indicated -Avoid nephrotoxic agents, ensure adequate renal perfusion  Aggressive Behavior, At Risk Danger to Self  -continue restrains, safety sitter    Best Practice (right click and "Reselect all SmartList Selections" daily)  Diet/type: clear liquids DVT prophylaxis: LMWH GI prophylaxis: PPI Lines: N/A Foley:  N/A Code Status:  full code Last date of multidisciplinary goals of care discussion: Full code. Family update pending 12/19.    Critical Care Time: 33 minutes     03/12/21, MSN, APRN, NP-C, AGACNP-BC [facility] 03/16/2021, 8:13 AM   Please see [email or web address] for pager details.   From 7A-7P if no response, please call [phone]  After hours, please call ELink [phone]

## 2021-03-16 NOTE — Plan of Care (Signed)

## 2021-03-16 NOTE — Progress Notes (Signed)
eLink Physician-Brief Progress Note Patient Name: Raymond [name] DOB: 09/25/1985 MRN: [id number]   Date of Service  03/16/2021  HPI/Events of Note  Received request for renewal of restraints including soft waist belt. Patient seen confused, trying to get out of bed and is a risk for self harm by pulling lines and tubes as well as falls.  eICU Interventions  Bilateral soft wrist restraints renewed to include soft waist belt. Bedside team to assess in am if restraints to be continued.     Intervention Category Minor Interventions: Agitation / anxiety - evaluation and management  Darl Pikes 03/16/2021, 5:12 AM

## 2021-03-16 NOTE — Progress Notes (Signed)
eLink Physician-Brief Progress Note Patient Name: Raymond [name] DOB: [birth year] MRN: [id number]   Date of Service  03/16/2021  HPI/Events of Note  Patient wants to sign out AMA. He is admitted for DKA and has a history of amphetamine abuse and borderline PD. He is chemically (Precedex IV infusion and Klonopin PO) and physically restrained d/t aggressive behavior toward the staff and is at risk of hurting himself or others. He is not yet on a full diet, therefore, it is not possible to estimate his insulin requirements at this time. From a medical point of view it is not safe to let him go AMA as he will very likely need to be readmitted with DKA at best and could die at worst. In my opinion, his judgement is altered by sedation with Precedex IV infusion and Klonopin PO and he is not competent to make a good decision at this time.   eICU Interventions  Plan: Continue sedation with Precedex IV infusion. He will require psychiatric evaluation in the AM to determine if he needs to be involuntarily held in the hospital or released AMA.     Intervention Category Major Interventions: Delirium, psychosis, severe agitation - evaluation and management  Emry Barbato Eugene 03/16/2021, 10:05 PM

## 2021-03-16 NOTE — Progress Notes (Signed)
Per Elink recommendations, precedex was continued due to patient showing physical and verbal aggression. Pt continued to be aggressive and pulling at his restraints. Counseled pt on positive behavioral changes needed to be able to remove restraints. He agrees to voice his needs instead of shouting, and to use his words instead of lashing out with his hands/feet. At this time, the wrist and ankle restraints have been discontinued. The belt restraint is still in place as a safety measure. Patient is in agreement with this plan and at this time, he is restless and fidgety, but is communicating his needs. A safety sitter is at bedside and the buddy system is in place.

## 2021-03-17 LAB — GLUCOSE, CAPILLARY
Glucose-Capillary: 112 mg/dL — ABNORMAL HIGH (ref 70–99)
Glucose-Capillary: 134 mg/dL — ABNORMAL HIGH (ref 70–99)
Glucose-Capillary: 154 mg/dL — ABNORMAL HIGH (ref 70–99)
Glucose-Capillary: 283 mg/dL — ABNORMAL HIGH (ref 70–99)
Glucose-Capillary: 324 mg/dL — ABNORMAL HIGH (ref 70–99)
Glucose-Capillary: 33 mg/dL — CL (ref 70–99)
Glucose-Capillary: 37 mg/dL — CL (ref 70–99)
Glucose-Capillary: 59 mg/dL — ABNORMAL LOW (ref 70–99)
Glucose-Capillary: 65 mg/dL — ABNORMAL LOW (ref 70–99)

## 2021-03-17 LAB — CBC
HCT: 38.2 % — ABNORMAL LOW (ref 39.0–52.0)
Hemoglobin: 12.7 g/dL — ABNORMAL LOW (ref 13.0–17.0)
MCH: 29.3 pg (ref 26.0–34.0)
MCHC: 33.2 g/dL (ref 30.0–36.0)
MCV: 88 fL (ref 80.0–100.0)
Platelets: 375 10*3/uL (ref 150–400)
RBC: 4.34 MIL/uL (ref 4.22–5.81)
RDW: 13.5 % (ref 11.5–15.5)
WBC: 6.6 10*3/uL (ref 4.0–10.5)
nRBC: 0 % (ref 0.0–0.2)

## 2021-03-17 LAB — MAGNESIUM: Magnesium: 2 mg/dL (ref 1.7–2.4)

## 2021-03-17 LAB — BASIC METABOLIC PANEL
Anion gap: 5 (ref 5–15)
BUN: 11 mg/dL (ref 6–20)
CO2: 27 mmol/L (ref 22–32)
Calcium: 8.7 mg/dL — ABNORMAL LOW (ref 8.9–10.3)
Chloride: 107 mmol/L (ref 98–111)
Creatinine, Ser: 0.99 mg/dL (ref 0.61–1.24)
GFR, Estimated: >60 mL/min (ref >60)
Glucose, Bld: 44 mg/dL — CL (ref 70–99)
Potassium: 4.3 mmol/L (ref 3.5–5.1)
Sodium: 139 mmol/L (ref 135–145)

## 2021-03-17 LAB — PHOSPHORUS: Phosphorus: 4.1 mg/dL (ref 2.5–4.6)

## 2021-03-17 MED ORDER — INSULIN DETEMIR 100 UNIT/ML ~~LOC~~ SOLN
5.0000 [IU] | Freq: Two times a day (BID) | SUBCUTANEOUS | Status: DC
Start: 2021-03-17 — End: 2021-03-17
  Administered 2021-03-17: 10:00:00 5 [IU] via SUBCUTANEOUS
  Filled 2021-03-17 (×2): qty 0.05

## 2021-03-17 MED ORDER — DEXTROSE 10 % IV SOLN
INTRAVENOUS | Status: DC
  Administered 2021-03-17: 06:00:00 50 mL/h via INTRAVENOUS

## 2021-03-17 MED ORDER — CLONAZEPAM 1 MG PO TABS
1.0000 mg | ORAL_TABLET | Freq: Two times a day (BID) | ORAL | Status: DC
  Administered 2021-03-17: 10:00:00 1 mg via ORAL
  Filled 2021-03-17: qty 1

## 2021-03-17 MED ORDER — NOVOLIN 70/30 RELION (70-30) 100 UNIT/ML ~~LOC~~ SUSP
20.0000 [IU] | SUBCUTANEOUS | 12 refills | Status: DC
Start: 2021-03-17 — End: 2023-06-15

## 2021-03-17 NOTE — Discharge Summary (Signed)
Physician Discharge Summary  Patient ID: Raymond [name] MRN: [id number] DOB/AGE: 35-31-1987 35 y.o.  Admit date: 03/10/2021 Discharge date: 03/17/2021    Discharge Diagnoses:  Type I DM with DKA  Acute Metabolic Encephalopathy secondary to DKA & Amphetamine Abuse  Methamphetamine Abuse  Bipolar Disorder  AKI                                                               DISCHARGE SUMMARY   35 y/o M, with a hx of Type I DM and methamphetamine abuse who presented to [facility] ER on 12/13 with reports of hyperglycemia.  He ran out of his insulin 24+ hours before presentation.  The patient reported he had been vomiting and was altered.  Initial work up consistent with DKA - glucose 548, venous pH 6.95, unable to calculate AG & beta hydroxybuteric acid of >8.  He had an associated acute kidney injury with sr cr 2.05. UDS was positive for amphetamines.  The patient was agitated on presentation.  He was admitted for treatment of DKA with IVF and insulin. Electrolytes were replaced.  He was started on precedex infusion in the ER for agitation and required this for several days due to prolonged agitated delirium thought secondary to amphetamine withdrawal.  He was placed on klonopin and seroquel for agitated delirium.  As he improved, he refused the oral agents.  He was ultimately weaned off precedex 12/20 without further behavioral disturbances.  His DKA resolved and was transitioned to insulin and tolerated well. The patient was able to ambulate in hallway.  He was medically cleared for discharge 12/20 with plans as below.       DISCHARGE PLAN BY DIAGNOSIS      Type I DM with DKA   Discharge Plan: -resume home Novolin 70/30 as previously prescribed  -patient indicates he has needles at home  -reviewed he can follow up at [facility] [place] or [facility]  -patient encouraged to seek follow up as above   Acute Metabolic Encephalopathy secondary to DKA &  Amphetamine Abuse   Discharge Plan: -encephalopathy resolved, no acute follow up needs   Methamphetamine Abuse   Discharge Plan: -counseling provided on options for outpatient resources, establishment with NA   Bipolar Disorder   Discharge Plan: -patient encouraged to follow up with [facility] [place]   AKI   Discharge Plan: -resolved, no acute follow up at this time                              KEY EVENTS 12/13 Admit with DKA  12/17 On precedex, agitated on awakening  12/18 Sedate or agitated, difficult to manage agitation  12/19 Weaning precedex. Hypoglycemia.  12/20 Off precedex, calm.  Oriented.    SIGNIFICANT DIAGNOSTIC STUDIES   MICRO DATA  COVID, Influenza 12/13 >> negative  Blood Cultures x2 12/13 >> negative   Discharge Exam: General: adult male sitting up in chair, wearing glasses  Neuro:AAOx4, speech clear, MAE  CV: s1s2 RRR PULM:non-labored at rest, lungs clear bilaterally  FJ:LSNR/HZB-QTCOLO, tolerated PO's Extremities: warm/dry  Vitals:   03/17/21 1100 03/17/21 1300 03/17/21 1400 03/17/21 1500  BP:  (!) 143/93 (!) 165/144 (!) 159/91  Pulse: (!) 103 (!) 103 (!) 116 73  Resp:  $Remo'12 19 17  'NqUqk$ Temp: 97.8 F (36.6 C)     TempSrc: Oral     SpO2: 99% 100% 97%   Weight:      Height:         Discharge Labs  BMET Recent Labs  Lab 03/13/21 0247 03/13/21 1126 03/14/21 0307 03/14/21 1700 03/15/21 0755 03/16/21 0902 03/17/21 0321  NA 145  --  139  --  139 139 139  K 3.0*  --  3.3*  --  3.6 4.1 4.3  CL 114*  --  106  --  106 108 107  CO2 24  --  23  --  $R'27 26 27  'Fk$ GLUCOSE 57*  --  316*  --  162* 132* 44*  BUN 9  --  10  --  $R'7 13 11  'Yt$ CREATININE 0.89  --  0.99  --  0.89 0.81 0.99  CALCIUM 8.0*  --  8.3*  --  8.5* 8.5* 8.7*  MG 2.2   < > 2.0 2.1 1.8 2.5* 2.0  PHOS 2.3*   < > 2.9 3.6 3.1 2.8 4.1   < > = values in this interval not displayed.    CBC Recent Labs  Lab 03/15/21 0755 03/16/21 0902 03/17/21 0321  HGB 11.7* 11.2*  12.7*  HCT 34.9* 34.0* 38.2*  WBC 6.5 6.7 6.6  PLT 319 313 375    Anti-Coagulation No results for input(s): INR in the last 168 hours.  Discharge Instructions     Call MD for:  difficulty breathing, headache or visual disturbances   Complete by: As directed    Call MD for:  extreme fatigue   Complete by: As directed    Call MD for:  hives   Complete by: As directed    Call MD for:  persistant dizziness or light-headedness   Complete by: As directed    Call MD for:  persistant nausea and vomiting   Complete by: As directed    Call MD for:  severe uncontrolled pain   Complete by: As directed    Call MD for:  temperature >100.4   Complete by: As directed    Diet Carb Modified   Complete by: As directed    Discharge instructions   Complete by: As directed    1. Resume prior home insulin regimen  2. Call to make an appointment to be seen at the [facility] & to be seen at [facility] 3. You can buy 70/30 insulin at [facility] without a prescription (Relion brand) 3. Avoid street drugs.   4. Return to the ER if new or worsening symptoms   Increase activity slowly   Complete by: As directed    No wound care   Complete by: As directed          Follow-up Information     [place]. Schedule an appointment as soon as possible for a visit.   Why: Call for hospital follow up and you can establish care with them for your Diabetes. Contact information: [place] [phone] [place] Schedule an appointment as soon as possible for a visit.   Specialty: Professional Counselor Why: Can be seen at [facility] or [facility].  Call to make an appointment. Contact information: [facility] [place]  [place] [id number] [phone]                  Allergies as of 03/17/2021        Reactions   Ibuprofen Anaphylaxis, Swelling   Facial swelling   Tramadol Other (See Comments)   Hypoglycemic Shock        Medication List     STOP taking these medications    Lantus SoloStar 100 UNIT/ML Solostar Pen Generic drug: insulin glargine       TAKE these medications    atorvastatin 10 MG tablet Commonly known as: LIPITOR Take 10 mg by mouth at bedtime.   lisinopril 10 MG tablet Commonly known as: ZESTRIL Take 1 tablet (10 mg total) by mouth daily.   NovoLIN 70/30 ReliOn (70-30) 100 UNIT/ML injection Generic drug: insulin NPH-regular Human Inject 20-40 Units into the skin See admin instructions. Inject 40 units into the skin in the morning, 20 units at noon, 30 units at bedtime        Disposition: Home. No new home health needs identified at discharge.  Match program utilized for insulin at discharge.   Discharged Condition: Raymond [name] has met maximum benefit of inpatient care and is medically stable and cleared for discharge.  Patient is pending follow up as above.      Time spent on disposition:  Greater than 35 minutes.     Signed: Noe Gens, MSN, APRN, NP-C, AGACNP-BC [facility] 03/17/2021, 6:54 PM   Please see [email or web address] for pager details.

## 2021-03-17 NOTE — TOC Progression Note (Addendum)
Transition of Care [facility]) - Progression Note    Patient Details  Name: SHAHRUKH PASCH MRN: [id number] Date of Birth: [birth year]  Transition of Care [facility]) CM/SW Contact  Golda Acre, RN Phone Number: 03/17/2021, 7:50 AM  Clinical Narrative:     Transition of Care [facility]) Screening Note   Patient Details  Name: ONIEL MELESKI Date of Birth: 07-15-1985   Transition of Care [facility]) CM/SW Contact:    Golda Acre, RN Phone Number: 03/17/2021, 7:50 AM    Transition of Care Department [facility]) has reviewed patient and no TOC needs have been identified at this time. We will continue to monitor patient advancement through interdisciplinary progression rounds. If new patient transition needs arise, please place a TOC consult.  NOTE: match program done , paper given to patient with explanation.  Verbally understands.   Expected Discharge Plan: Home/Self Care Barriers to Discharge: Continued Medical Work up  Expected Discharge Plan and Services Expected Discharge Plan: Home/Self Care   Discharge Planning Services: CM Consult   Living arrangements for the past 2 months: Apartment                                       Social Determinants of Health (SDOH) Interventions    Readmission Risk Interventions No flowsheet data found.

## 2021-03-17 NOTE — Progress Notes (Addendum)
NAME:  Raymond [name], MRN:  [id number], DOB:  12/11/1985, LOS: 7 ADMISSION DATE:  03/10/2021, CONSULTATION DATE:  03/10/21 REFERRING MD:  Dr Blinda Leatherwood EDP, CHIEF COMPLAINT:  DKA   History of Present Illness:  35 yo man with hx of DMI, borderline personality disorder per chart, depression, here with hyperglycemia, DKA.  Ran out of insulin 1-2 days prior to presentation. Treated with IVF + insulin. Blood sugar has improved from 530 down to 275. Initial PH 6.952/15/47 on VBG  - no improvement in follow up pH. Anion gap at least 26 (HCO3 <7)  BHOB elevated.  Was agitated, received ativan 1mg  2mg  at 3am, now sleepy but arousable.  Hypothermic 95.1 at 4am.  In ED given fluids 3L then 125 per hours.  Started insulin gtt. 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   Pertinent  Medical History  Dm1 Depression Substance abuse  Significant Hospital Events: Including procedures, antibiotic start and stop dates in addition to other pertinent events   12/17 Still gets agitated whenever he arouses 12/18 Still significantly agitated when he is awake, sedate otherwise 12/19 On precedex, weaning. Sitter at bedside. Asking to leave AMA (on precedex)  Interim History / Subjective:  Hypoglycemia overnight into the 30's  Patient asking to leave as soon as he can  Afebrile  Objective   Blood pressure (!) 104/55, pulse 67, temperature 97.8 F (36.6 C), temperature source Oral, resp. rate 18, height 5\' 10"  (1.778 m), weight 89.2 kg, SpO2 100 %.        Intake/Output Summary (Last 24 hours) at 03/17/2021 0747 Last data filed at 03/17/2021 0326 Gross per 24 hour  Intake 473.89 ml  Output 1100 ml  Net -626.11 ml   Filed Weights   03/10/21 0811 03/10/21 1100 03/15/21 0424  Weight: 81.6 kg 83.8 kg 89.2 kg    Examination: General: adult male lying in bed in NAD  HEENT: MM pink/moist, multiple facial piercing's Neuro: Awake, alert,  oriented to person, place, year / pending holidays, events surrounding the [place].  Insight into situation intact.  MAE/non-focal exam.  Patient is able to articulate his insulin regimen at home. CV: s1s2 RRR, no m/r/g PULM: non-labored at rest, lungs bilaterally clear  GI: soft, bsx4 active  Extremities: warm/dry, no edema  Skin: no rashes or lesions.  Extensive tattoos   Resolved Hospital Problem list   AKI - in setting of volume depletion / DKA  Assessment & Plan:   Type I DM with DKA  Uses Relion 70/30 at home -SSI, sensitive scale  -Levemir 10 units BID  -advance diet to carb modified    Acute Metabolic Encephalopathy secondary to DKA and Amphetamine Abuse -reduce klnonopin to 1mg  BID  -seroquel 200 mg BID  -PRN ativan, haldol  -wean precedex to off  -QTc 418 12/20 am, monitor intermittently   Amphetamine Abuse -cessation counseling   Aggressive Behavior, At Risk / Danger to Self  -work toward removal of restraints, precedex    Best Practice (right click and "Reselect all SmartList Selections" daily)  Diet/type: clear liquids DVT prophylaxis: LMWH GI prophylaxis: PPI Lines: N/A Foley:  N/A Code Status:  full code Last date of multidisciplinary goals of care discussion: Full code. Patient updated on plan of care 12/20  Critical Care Time: 31 minutes     05-17-1971, MSN, APRN, NP-C, AGACNP-BC [facility] 03/17/2021, 7:47 AM   Please see [email or web address] for pager details.  From 7A-7P if no response, please call [phone] After hours, please call ELink [phone]

## 2021-03-17 NOTE — Progress Notes (Signed)
Precedex remains off.  Patient up to chair, calm.  Ate 2 breakfast trays this am.  No distress.  Hoping to go home. Reviewed his home situation > lives in an apt on [address], works occasionally in [employer], has a daughter but does not get to see her.  He indicates he uses meth because he "gets bored".  Is [facility] by a provider at [facility] and gets his insulin from the health department.  He has a ride home if is can be discharged.    Plan: -ambulate patient in hall -discontinue precedex from [facility]  -will discuss discharge insulin regimen with Diabetes Coordinator     Canary Brim, MSN, APRN, NP-C, AGACNP-BC [facility] 03/17/2021, 11:33 AM   Please see [email or web address] for pager details.   From 7A-7P if no response, please call [phone] After hours, please call ELink [phone]

## 2021-03-17 NOTE — Progress Notes (Signed)
eLink Physician-Brief Progress Note Patient Name: Raymond [name] DOB: 31-Jan-1986 MRN: [id number]   Date of Service  03/17/2021  HPI/Events of Note  Hypoglycemia - Blood glucose = 33 --> 65 --> 56 --> 112.   eICU Interventions  Plan: Decrease Levemir from 10 units to 5 units BID. D10W IV infusion at 50 mL/hour.      Intervention Category Major Interventions: Other:  Lenell Antu 03/17/2021, 4:51 AM

## 2021-03-17 NOTE — Progress Notes (Signed)
Called E-Link regarding BS trends. Patient is alert and drinking his juice. Will continue to monitor.

## 2021-03-17 NOTE — Plan of Care (Signed)
°  Problem: Safety: Goal: Violent Restraint(s) Outcome: Adequate for Discharge   Problem: Safety: Goal: Non-violent Restraint(s) Outcome: Adequate for Discharge   Problem: Education: Goal: Knowledge of General Education information will improve Description: Including pain rating scale, medication(s)/side effects and non-pharmacologic comfort measures Outcome: Adequate for Discharge   Problem: Health Behavior/Discharge Planning: Goal: Ability to manage health-related needs will improve Outcome: Adequate for Discharge   Problem: Clinical Measurements: Goal: Ability to maintain clinical measurements within normal limits will improve Outcome: Adequate for Discharge Goal: Will remain free from infection Outcome: Adequate for Discharge Goal: Diagnostic test results will improve Outcome: Adequate for Discharge Goal: Respiratory complications will improve Outcome: Adequate for Discharge Goal: Cardiovascular complication will be avoided Outcome: Adequate for Discharge   Problem: Activity: Goal: Risk for activity intolerance will decrease Outcome: Adequate for Discharge   Problem: Nutrition: Goal: Adequate nutrition will be maintained Outcome: Adequate for Discharge   Problem: Coping: Goal: Level of anxiety will decrease Outcome: Adequate for Discharge   Problem: Elimination: Goal: Will not experience complications related to bowel motility Outcome: Adequate for Discharge Goal: Will not experience complications related to urinary retention Outcome: Adequate for Discharge   Problem: Pain Managment: Goal: General experience of comfort will improve Outcome: Adequate for Discharge   Problem: Safety: Goal: Ability to remain free from injury will improve Outcome: Adequate for Discharge   Problem: Skin Integrity: Goal: Risk for impaired skin integrity will decrease Outcome: Adequate for Discharge   Problem: Education: Goal: Ability to describe self-care measures that may  prevent or decrease complications (Diabetes Survival Skills Education) will improve Outcome: Adequate for Discharge Goal: Individualized Educational Video(s) Outcome: Adequate for Discharge   Problem: Cardiac: Goal: Ability to maintain an adequate cardiac output will improve Outcome: Adequate for Discharge   Problem: Health Behavior/Discharge Planning: Goal: Ability to identify and utilize available resources and services will improve Outcome: Adequate for Discharge Goal: Ability to manage health-related needs will improve Outcome: Adequate for Discharge   Problem: Fluid Volume: Goal: Ability to achieve a balanced intake and output will improve Outcome: Adequate for Discharge   Problem: Metabolic: Goal: Ability to maintain appropriate glucose levels will improve Outcome: Adequate for Discharge   Problem: Nutritional: Goal: Maintenance of adequate nutrition will improve Outcome: Adequate for Discharge Goal: Maintenance of adequate weight for body size and type will improve Outcome: Adequate for Discharge   Problem: Respiratory: Goal: Will regain and/or maintain adequate ventilation Outcome: Adequate for Discharge   Problem: Urinary Elimination: Goal: Ability to achieve and maintain adequate renal perfusion and functioning will improve Outcome: Adequate for Discharge

## 2021-03-17 NOTE — Progress Notes (Addendum)
Inpatient Diabetes Program Recommendations  AACE/ADA: New Consensus Statement on Inpatient Glycemic Control (2015)  Target Ranges:  Prepandial:   less than 140 mg/dL      Peak postprandial:   less than 180 mg/dL (1-2 hours)      Critically ill patients:  140 - 180 mg/dL   Lab Results  Component Value Date   GLUCAP 134 (H) 03/17/2021   HGBA1C 10.8 (H) 03/10/2021    Review of Glycemic Control  Diabetes history: DM1 Outpatient Diabetes medications: Novolin ReliOn 70/30 40-20-30 TID Current orders for Inpatient glycemic control: Levemir 5 units BID, Novolog 0-9 units Q4H  HgbA1C - 10.8% Hyperglycemia during the night at 0300.  Inpatient Diabetes Program Recommendations:    When eating CHO mod diet with intake greater than 50%:  Levemir 8 units BID Add Novolog 5 units TID with meals if eating > 50%  Will speak with pt today regarding his diabetes control and HgbA1C.  Thank you. Ailene Ards, RD, LDN, CDE Inpatient Diabetes Coordinator [phone]   Addendum:  Spoke with pt about his diabetes regimen at home. States he says he takes Novolin ReliOn 70/30 50-20-30 TID. When questioned about this high dose, pt says he eats "a whole lot" of food at home. Rarely has lows. Insulin purchased at [facility] and does not need a prescription for the ReliOn 70/30. Discussed importance of improving HgbA1C. RV

## 2021-04-15 ENCOUNTER — Other Ambulatory Visit: Payer: Self-pay

## 2021-04-15 ENCOUNTER — Emergency Department (HOSPITAL_COMMUNITY): Payer: Self-pay

## 2021-04-15 ENCOUNTER — Emergency Department (HOSPITAL_COMMUNITY)
Admission: EM | Admit: 2021-04-15 | Discharge: 2021-04-16 | Disposition: A | Discharge status: Home / Self Care | Payer: Self-pay | Attending: Emergency Medicine | Admitting: Emergency Medicine

## 2021-04-15 ENCOUNTER — Encounter (HOSPITAL_COMMUNITY): Payer: Self-pay | Admitting: *Deleted

## 2021-04-15 DIAGNOSIS — W228XXA Striking against or struck by other objects, initial encounter: Secondary | ICD-10-CM | POA: Insufficient documentation

## 2021-04-15 DIAGNOSIS — Y990 Civilian activity done for income or pay: Secondary | ICD-10-CM | POA: Insufficient documentation

## 2021-04-15 DIAGNOSIS — M25531 Pain in right wrist: Secondary | ICD-10-CM | POA: Insufficient documentation

## 2021-04-15 NOTE — ED Triage Notes (Addendum)
Right wrist injury at work 1 month ago. C/o pain when he puts pressure on it or lifts anything.

## 2021-04-15 NOTE — ED Provider Triage Note (Signed)
Emergency Medicine Provider Triage Evaluation Note  Raymond [name] , a 36 y.o. male  was evaluated in triage.  Pt complains of right wrist pain.  Patient states that he struck his wrist on an object at work about 1 month ago and has been experiencing mild pain since then.  Pain worsens when moving the wrist or when lifting objects.  No fevers, chills, swelling, numbness  Physical Exam  BP (!) 181/120    Pulse (!) 102    Temp 98.3 F (36.8 C) (Oral)    Resp 20    SpO2 98%  Gen:   Awake, no distress   Resp:  Normal effort  MSK:   Moves extremities without difficulty  Other:    Medical Decision Making  Medically screening exam initiated at 11:52 PM.  Appropriate orders placed.  Raymond [name] was informed that the remainder of the evaluation will be completed by another provider, this initial triage assessment does not replace that evaluation, and the importance of remaining in the ED until their evaluation is complete.   Placido Sou, PA-C 04/15/21 2353

## 2021-04-16 NOTE — Discharge Instructions (Signed)
Please adhere to the "RICE Method" for rehabbing. This stands for "rest, ice, compress, and elevate". Please continue to perform stretches and exercise as much as your pain allows. It is important that you keep moving to help heal your injury.   If you develop any new or worsening symptoms please do not hesitate to return to the emergency department. It was a pleasure to meet you.

## 2021-04-16 NOTE — ED Provider Notes (Signed)
[facility] EMERGENCY DEPARTMENT Provider Note   CSN: [id number] Arrival date & time: 04/15/21  2343     History  Chief Complaint  Patient presents with   Wrist Pain    BRAXTYN [name] is a 36 y.o. male.  HPI Patient is a 36 year old male who presents to the emergency department due to right wrist pain for the past month.  He states [name] he accidentally struck the right wrist while at work 1 month ago and has been having persistent pain since then.  States [name] the pain worsens with movement as well as palpation.  Denies any fevers, chills, swelling, numbness.    Home Medications Prior to Admission medications   Medication Sig Start Date End Date Taking? Authorizing Provider  atorvastatin (LIPITOR) 10 MG tablet Take 10 mg by mouth at bedtime. 11/30/19   [provider]  lisinopril (ZESTRIL) 10 MG tablet Take 1 tablet (10 mg total) by mouth daily. 02/04/21 03/10/22  Linwood Dibbles, MD  NOVOLIN 70/30 RELION (70-30) 100 UNIT/ML injection Inject 20-40 Units into the skin See admin instructions. Inject 40 units into the skin in the morning, 20 units at noon, 30 units at bedtime 03/17/21   Canary Brim L, NP      Allergies    Ibuprofen and Tramadol    Review of Systems   Review of Systems  Constitutional:  Negative for chills and fever.  Musculoskeletal:  Positive for arthralgias and myalgias. Negative for joint swelling.  Skin:  Negative for wound.  Neurological:  Negative for weakness and numbness.   Physical Exam Updated Vital Signs BP (!) 167/112 (BP Location: Right Arm)    Pulse 99    Temp 98.3 F (36.8 C)    Resp (!) 22    SpO2 95%  Physical Exam Vitals and nursing note reviewed.  Constitutional:      General: He is not in acute distress.    Appearance: Normal appearance. He is well-developed.  HENT:     Head: Normocephalic and atraumatic.     Right Ear: External ear normal.     Left Ear: External ear normal.     Mouth/Throat:     Pharynx:  Oropharynx is clear.  Eyes:     General: No scleral icterus.       Right eye: No discharge.        Left eye: No discharge.     Conjunctiva/sclera: Conjunctivae normal.  Neck:     Trachea: No tracheal deviation.  Cardiovascular:     Rate and Rhythm: Normal rate.  Pulmonary:     Effort: Pulmonary effort is normal. No respiratory distress.     Breath sounds: No stridor.  Abdominal:     General: Abdomen is flat. There is no distension.  Musculoskeletal:        General: Tenderness present. No swelling or deformity.     Cervical back: Neck supple.     Comments: Mild tenderness noted overlying the distal ulna of the right wrist.  No overlying skin changes.  No soft tissue swelling.  Full range of motion of the wrist.  Grip strength intact.  Distal sensation intact.  2+ radial pulses.  Skin:    General: Skin is warm and dry.     Findings: No rash.  Neurological:     General: No focal deficit present.     Mental Status: He is alert and oriented to person, place, and time.     Cranial Nerves: Cranial nerve deficit: no  gross deficits.   ED Results / Procedures / Treatments   Labs (all labs ordered are listed, but only abnormal results are displayed) Labs Reviewed - No data to display  EKG None  Radiology DG Wrist Complete Right  Result Date: 04/16/2021 CLINICAL DATA:  Wrist pain EXAM: RIGHT WRIST - COMPLETE 3+ VIEW COMPARISON:  None. FINDINGS: There is no evidence of fracture or dislocation. There is no evidence of arthropathy or other focal bone abnormality. Soft tissues are unremarkable. IMPRESSION: No acute abnormality noted. Electronically Signed   By: Alcide Clever M.D.   On: 04/16/2021 00:19    Procedures Procedures    Medications Ordered in ED Medications - No data to display  ED Course/ Medical Decision Making/ A&P                           Medical Decision Making Amount and/or Complexity of Data Reviewed Radiology: ordered.  Patient is a 36 year old male who  presents to the emergency department due to right wrist pain for the past month.  On my exam patient has mild tenderness to palpation along the distal ulna.  Full range of motion of the right wrist.  No overlying skin changes.  Neurovascularly intact distal to the site.  X-rays were obtained in triage which are negative.  Feel [name] the patient's symptoms are likely secondary to overuse.  Patient states he uses his wrist throughout the day while working.  Discussed the RICE method.  Feel [name] he is stable for discharge at this time and he is agreeable.  We discussed return precautions.  His questions were answered and he was amicable at the time of discharge  Final Clinical Impression(s) / ED Diagnoses Final diagnoses:  Right wrist pain   Rx / DC Orders ED Discharge Orders     None         Placido Sou, PA-C 04/16/21 0145    Tilden Fossa, MD 04/16/21 [phone]

## 2021-04-23 ENCOUNTER — Other Ambulatory Visit: Payer: Self-pay

## 2021-04-23 ENCOUNTER — Emergency Department (HOSPITAL_COMMUNITY): Payer: Self-pay

## 2021-04-23 ENCOUNTER — Emergency Department (HOSPITAL_COMMUNITY)
Admission: EM | Admit: 2021-04-23 | Discharge: 2021-04-23 | Disposition: A | Discharge status: Home / Self Care | Payer: Self-pay | Attending: Emergency Medicine | Admitting: Emergency Medicine

## 2021-04-23 ENCOUNTER — Encounter (HOSPITAL_COMMUNITY): Payer: Self-pay | Admitting: *Deleted

## 2021-04-23 DIAGNOSIS — E109 Type 1 diabetes mellitus without complications: Secondary | ICD-10-CM | POA: Insufficient documentation

## 2021-04-23 DIAGNOSIS — H4311 Vitreous hemorrhage, right eye: Secondary | ICD-10-CM | POA: Insufficient documentation

## 2021-04-23 DIAGNOSIS — Z79899 Other long term (current) drug therapy: Secondary | ICD-10-CM | POA: Insufficient documentation

## 2021-04-23 DIAGNOSIS — E1069 Type 1 diabetes mellitus with other specified complication: Secondary | ICD-10-CM

## 2021-04-23 DIAGNOSIS — I10 Essential (primary) hypertension: Secondary | ICD-10-CM | POA: Insufficient documentation

## 2021-04-23 DIAGNOSIS — Z794 Long term (current) use of insulin: Secondary | ICD-10-CM | POA: Insufficient documentation

## 2021-04-23 LAB — BASIC METABOLIC PANEL
Anion gap: 10 (ref 5–15)
BUN: 15 mg/dL (ref 6–20)
CO2: 26 mmol/L (ref 22–32)
Calcium: 9.1 mg/dL (ref 8.9–10.3)
Chloride: 99 mmol/L (ref 98–111)
Creatinine, Ser: 1.07 mg/dL (ref 0.61–1.24)
GFR, Estimated: >60 mL/min (ref >60)
Glucose, Bld: 193 mg/dL — ABNORMAL HIGH (ref 70–99)
Potassium: 3.9 mmol/L (ref 3.5–5.1)
Sodium: 135 mmol/L (ref 135–145)

## 2021-04-23 LAB — CBC WITH DIFFERENTIAL/PLATELET
Abs Immature Granulocytes: 0.03 10*3/uL (ref 0.00–0.07)
Basophils Absolute: 0.1 10*3/uL (ref 0.0–0.1)
Basophils Relative: 1 %
Eosinophils Absolute: 0.1 10*3/uL (ref 0.0–0.5)
Eosinophils Relative: 1 %
HCT: 43.2 % (ref 39.0–52.0)
Hemoglobin: 14.8 g/dL (ref 13.0–17.0)
Immature Granulocytes: 0 %
Lymphocytes Relative: 26 %
Lymphs Abs: 1.9 10*3/uL (ref 0.7–4.0)
MCH: 30.2 pg (ref 26.0–34.0)
MCHC: 34.3 g/dL (ref 30.0–36.0)
MCV: 88.2 fL (ref 80.0–100.0)
Monocytes Absolute: 0.3 10*3/uL (ref 0.1–1.0)
Monocytes Relative: 4 %
Neutro Abs: 4.9 10*3/uL (ref 1.7–7.7)
Neutrophils Relative %: 68 %
Platelets: 414 10*3/uL — ABNORMAL HIGH (ref 150–400)
RBC: 4.9 MIL/uL (ref 4.22–5.81)
RDW: 12.6 % (ref 11.5–15.5)
WBC: 7.2 10*3/uL (ref 4.0–10.5)
nRBC: 0 % (ref 0.0–0.2)

## 2021-04-23 LAB — CBG MONITORING, ED: Glucose-Capillary: 335 mg/dL — ABNORMAL HIGH (ref 70–99)

## 2021-04-23 NOTE — ED Triage Notes (Signed)
Right eye blurry since waking up this morning, no drainage or redness. Pt says he has a tooth decaying on the right side (c/o tooth pain)  and had right jaw pain and headache last night, denies fevers.   BP elevated in triage, pt says he "just started taking them again today" (blood pressure medications)

## 2021-04-23 NOTE — Discharge Instructions (Signed)
Do not eat before going to the eye doctor tomorrow morning.

## 2021-04-23 NOTE — ED Provider Triage Note (Addendum)
Emergency Medicine Provider Triage Evaluation Note  Raymond [name] , a 36 y.o. male  was evaluated in triage.  Pt complains of right eye with blurred vision onset this morning.  Has not tried medications for his symptoms.  Denies eye drainage, eye redness, eye pain, shortness of breath, dizziness, chest pain.  Does not take any blood thinners.  Has a past medical history of hypertension and diabetes.  He notes that he just started taking back his medications for hypertension and diabetes today patient notes that he had difficulty with seeing his provider however is not taking medications.  ChangeReview of Systems  Positive: As per HPI above. Negative: Eye drainage, eye redness, eye pain  Physical Exam  BP (!) 195/110 (BP Location: Right Arm)    Pulse (!) 122    Temp 98.6 F (37 C) (Oral)    Resp 18    Ht 5\' 10"  (1.778 m)    Wt 83 kg    SpO2 98%    BMI 26.26 kg/m  Gen:   Awake, no distress   Resp:  Normal effort  MSK:   Moves extremities without difficulty  Other:  EOMI. PERRL. Visual fields grossly intact.  Mild difficulty with superior temporal visual field, however, grossly intact. No tenderness to palpation noted to frontal or maxillary regions.  No overlying skin changes.  No overlying erythema noted to right lids.  Medical Decision Making  Medically screening exam initiated at 7:55 PM.  Appropriate orders placed.  Raymond [name] was informed that the remainder of the evaluation will be completed by another provider, this initial triage assessment does not replace that evaluation, and the importance of remaining in the ED until their evaluation is complete.  8:08 PM - Discussed with RN that patient is in need of a room. RN aware and working on room placement.    Raymond [name] A, PA-C 04/23/21 2010    [facility] [name] A, PA-C 04/23/21 2012

## 2021-04-23 NOTE — ED Provider Notes (Signed)
[facility] EMERGENCY DEPARTMENT Provider Note   CSN: [id number] Arrival date & time: 04/23/21  1859     History  Chief Complaint  Patient presents with   Eye Problem    Raymond [name] is a 36 y.o. male.  Pt is a 36 yo male with a hx of htn and dm1.  Pt said he woke up this am and his right eye vision is blurry.  He said he sees a brown haze.  He can see normally out of his left eye.  Pt's bp is elevated in triage.  He said he just started taking his bp meds today.        Home Medications Prior to Admission medications   Medication Sig Start Date End Date Taking? Authorizing Provider  atorvastatin (LIPITOR) 10 MG tablet Take 10 mg by mouth at bedtime. 11/30/19   [provider]  lisinopril (ZESTRIL) 10 MG tablet Take 1 tablet (10 mg total) by mouth daily. 02/04/21 03/10/22  Dorie Rank, MD  NOVOLIN 70/30 RELION (70-30) 100 UNIT/ML injection Inject 20-40 Units into the skin See admin instructions. Inject 40 units into the skin in the morning, 20 units at noon, 30 units at bedtime 03/17/21   Noe Gens L, NP      Allergies    Ibuprofen and Tramadol    Review of Systems   Review of Systems  Eyes:  Positive for visual disturbance.  All other systems reviewed and are negative.  Physical Exam Updated Vital Signs BP (!) 195/110 (BP Location: Right Arm)    Pulse (!) 122    Temp 98.6 F (37 C) (Oral)    Resp 18    Ht 5\' 10"  (1.778 m)    Wt 83 kg    SpO2 98%    BMI 26.26 kg/m  Physical Exam Vitals and nursing note reviewed.  Constitutional:      Appearance: Normal appearance.  HENT:     Head: Normocephalic and atraumatic.     Right Ear: External ear normal.     Left Ear: External ear normal.     Nose: Nose normal.     Mouth/Throat:     Mouth: Mucous membranes are moist.     Pharynx: Oropharynx is clear.  Eyes:     Pupils: Pupils are equal, round, and reactive to light.     Comments: ?vitreous hemorrhage  Cardiovascular:     Rate and Rhythm:  Normal rate and regular rhythm.     Pulses: Normal pulses.     Heart sounds: Normal heart sounds.  Pulmonary:     Effort: Pulmonary effort is normal.     Breath sounds: Normal breath sounds.  Abdominal:     General: Abdomen is flat. Bowel sounds are normal.     Palpations: Abdomen is soft.  Musculoskeletal:        General: Normal range of motion.     Cervical back: Normal range of motion and neck supple.  Skin:    General: Skin is warm.     Capillary Refill: Capillary refill takes less than 2 seconds.  Neurological:     General: No focal deficit present.     Mental Status: He is alert and oriented to person, place, and time.  Psychiatric:        Mood and Affect: Mood normal.        Behavior: Behavior normal.        Thought Content: Thought content normal.  Judgment: Judgment normal.    ED Results / Procedures / Treatments   Labs (all labs ordered are listed, but only abnormal results are displayed) Labs Reviewed  BASIC METABOLIC PANEL - Abnormal; Notable for the following components:      Result Value   Glucose, Bld 193 (*)    All other components within normal limits  CBC WITH DIFFERENTIAL/PLATELET - Abnormal; Notable for the following components:   Platelets 414 (*)    All other components within normal limits  CBG MONITORING, ED - Abnormal; Notable for the following components:   Glucose-Capillary 335 (*)    All other components within normal limits    EKG EKG Interpretation  Date/Time:  Thursday April 23 2021 20:12:12 EST Ventricular Rate:  115 PR Interval:  146 QRS Duration: 86 QT Interval:  322 QTC Calculation: 445 R Axis:   79 Text Interpretation: Sinus tachycardia Otherwise normal ECG When compared with ECG of 17-Mar-2021 06:39, PREVIOUS ECG IS PRESENT Since last tracing rate faster Confirmed by Isla Pence [phone]) on 04/23/2021 9:45:25 PM  Radiology CT Orbits Wo Contrast  Result Date: 04/23/2021 CLINICAL DATA:  Awoke today with blurriness in  the right eye. EXAM: CT ORBITS WITHOUT CONTRAST TECHNIQUE: Multidetector CT imaging of the orbits was performed using the standard protocol without intravenous contrast. Multiplanar CT image reconstructions were also generated. RADIATION DOSE REDUCTION: This exam was performed according to the departmental dose-optimization program which includes automated exposure control, adjustment of the mA and/or kV according to patient size and/or use of iterative reconstruction technique. COMPARISON:  Maxillofacial CT 10/23/2020. FINDINGS: Orbits: Both globes appear normal. Both lenses appear normal. Optic nerves are symmetric and normal. Orbital fat is normal. Extra-ocular muscles are normal. Orbital apices are normal. Visible paranasal sinuses: Clear. Few insignificant tiny retention cysts. Soft tissues: Other regional soft tissues of the face appear normal. Osseous: Normal. Limited intracranial: Normal. IMPRESSION: Normal CT scan of the orbits. No abnormality seen to explain blurred vision of the right eye. Electronically Signed   By: Nelson Chimes M.D.   On: 04/23/2021 21:44    Procedures Procedures    Medications Ordered in ED Medications - No data to display  ED Course/ Medical Decision Making/ A&P                           Medical Decision Making  Labs and CT reviewed by me.  Pt most likely has a vitreous hemorrhage from DM or HTN.  I spoke with Dr. Talbert Forest (ophthalmology).  He will see him tomorrow morning at 0800.  He asks that I tell pt not to eat before coming in as he could possibly need to see the retina specialist.  Pt is aware of plan and will be at his office tomorrow morning.  Pt is stable for d/c.  Return if worse.         Final Clinical Impression(s) / ED Diagnoses Final diagnoses:  Vitreous hemorrhage of right eye ([place])  Hypertension, unspecified type  Type 1 diabetes mellitus with other specified complication [facility])    Rx / [place] Orders ED Discharge Orders     None          Isla Pence, MD 04/23/21 2208

## 2021-04-28 ENCOUNTER — Other Ambulatory Visit: Payer: Self-pay | Admitting: Ophthalmology

## 2021-04-28 LAB — HM DIABETES EYE EXAM

## 2021-05-06 ENCOUNTER — Other Ambulatory Visit: Payer: Self-pay | Admitting: Ophthalmology

## 2021-05-07 DIAGNOSIS — Z139 Encounter for screening, unspecified: Secondary | ICD-10-CM

## 2021-05-12 ENCOUNTER — Encounter (HOSPITAL_COMMUNITY): Payer: Self-pay | Admitting: Ophthalmology

## 2021-05-12 ENCOUNTER — Other Ambulatory Visit: Payer: Self-pay

## 2021-05-12 LAB — GLUCOSE, POCT (MANUAL RESULT ENTRY): POC Glucose: 559 mg/dl — AB (ref 70–99)

## 2021-05-12 NOTE — Congregational Nurse Program (Signed)
°  Dept: [phone]   Congregational Nurse Program Note  Date of Encounter: 05/07/2021  Past Medical History: Past Medical History:  Diagnosis Date   Borderline personality disorder (HCC)    COVID    has had it twice, last time was in 2022, mild case 2nd time, worse on the first time   Diabetes mellitus without complication (HCC)    Type 1   Gallstones    Headache    Hypertension    Substance abuse (HCC)    drug and alcohol abuse since age 36    Encounter Details:  CNP Questionnaire - 05/12/21 1558       Questionnaire   Do you give verbal consent to treat you today? Yes    Location Patient Served  Pensions consultant    Visit Setting Church or Organization    Patient Status Unknown    Insurance Uninsured ([employer]/Self-Pay)    Insurance Referral N/A    Medication N/A    Medical Provider Yes    Screening Referrals Annual Wellness Visit    Medical Referral [facility]CP[facility]    Medical Appointment Made Other    Food N/A    Transportation N/A    Housing/Utilities N/A    Interpersonal Safety N/A    Intervention Blood pressure;N/A    ED Visit Averted Yes    Life-Saving Intervention Made Yes   blood pressure 192/102            Patient was seen on 05/07/21. Ambulance was called on patient due to high blood pressure and high glucose level. Glucose level 559, BP is 192/102.

## 2021-05-12 NOTE — Progress Notes (Signed)
Spoke with pt for pre-op call. Pt is a type 1 diabetic. Last A1C was 10.8 on 03/10/21. Pt admits that he doesn't always take his insulin as he should, but is trying to do better. Instructed him to take 70% of his Novolin 70/30 Wednesday evening, he will take 40 units. Instructed him to take 1/2 of his regular dose Novolin 70/30 Thursday AM, he will take 25 units . Instructed him to check his blood sugar when he wakes up Thursday morning. If blood sugar is 70 or below, treat with 1/2 cup of clear juice (apple or cranberry) and recheck blood sugar 15 minutes after drinking juice. If blood sugar continues to be 70 or below, notify your nurse on arrival to Short Stay.  Pt is very open about his substance abuse. He has not had cocaine for 2 years, Methamphetamines none since mid-December, no OTC cough medicine for a week. Has not had alcohol for 1.5 weeks.  Pt's surgery is scheduled as ambulatory so no Covid test is required prior to surgery.

## 2021-05-13 ENCOUNTER — Encounter (HOSPITAL_COMMUNITY): Payer: Self-pay | Admitting: Physician Assistant

## 2021-05-13 NOTE — Anesthesia Preprocedure Evaluation (Deleted)
Anesthesia Evaluation    Airway        Dental   Pulmonary           Cardiovascular hypertension, Pt. on medications      Neuro/Psych  Headaches, PSYCHIATRIC DISORDERS Depression Diabetic retinopathy    GI/Hepatic (+)     substance abuse  alcohol use, cocaine use and methamphetamine use,   Endo/Other  diabetes, Insulin Dependent  Renal/GU      Musculoskeletal   Abdominal   Peds  Hematology   Anesthesia Other Findings   Reproductive/Obstetrics                             Anesthesia Physical Anesthesia Plan  ASA: 3  Anesthesia Plan: MAC   Post-op Pain Management: Tylenol PO (pre-op)*   Induction:   PONV Risk Score and Plan: 1 and Ondansetron and Treatment may vary due to age or medical condition  Airway Management Planned: Natural Airway and Nasal Cannula  Additional Equipment: None  Intra-op Plan:   Post-operative Plan:   Informed Consent:   Plan Discussed with:   Anesthesia Plan Comments:         Anesthesia Quick Evaluation

## 2021-05-14 ENCOUNTER — Other Ambulatory Visit: Payer: Self-pay

## 2021-05-14 ENCOUNTER — Encounter (HOSPITAL_COMMUNITY): Payer: Self-pay | Admitting: Ophthalmology

## 2021-05-14 ENCOUNTER — Ambulatory Visit (HOSPITAL_COMMUNITY)
Admission: RE | Admit: 2021-05-14 | Discharge: 2021-05-14 | Disposition: A | Discharge status: Home / Self Care | Payer: Self-pay | Attending: Ophthalmology | Admitting: Ophthalmology

## 2021-05-14 ENCOUNTER — Encounter (HOSPITAL_COMMUNITY)
Admission: RE | Disposition: A | Discharge status: Home / Self Care | Payer: Self-pay | Source: Home / Self Care | Attending: Ophthalmology

## 2021-05-14 DIAGNOSIS — I10 Essential (primary) hypertension: Secondary | ICD-10-CM | POA: Insufficient documentation

## 2021-05-14 DIAGNOSIS — Z538 Procedure and treatment not carried out for other reasons: Secondary | ICD-10-CM | POA: Insufficient documentation

## 2021-05-14 DIAGNOSIS — E103591 Type 1 diabetes mellitus with proliferative diabetic retinopathy without macular edema, right eye: Secondary | ICD-10-CM | POA: Insufficient documentation

## 2021-05-14 HISTORY — DX: Essential (primary) hypertension: I10

## 2021-05-14 HISTORY — DX: Other psychoactive substance abuse, uncomplicated: F19.10

## 2021-05-14 HISTORY — DX: COVID-19: U07.1

## 2021-05-14 HISTORY — DX: Headache, unspecified: R51.9

## 2021-05-14 LAB — COMPREHENSIVE METABOLIC PANEL
ALT: 62 U/L — ABNORMAL HIGH (ref 0–44)
AST: 42 U/L — ABNORMAL HIGH (ref 15–41)
Albumin: 2.7 g/dL — ABNORMAL LOW (ref 3.5–5.0)
Alkaline Phosphatase: 174 U/L — ABNORMAL HIGH (ref 38–126)
Anion gap: 8 (ref 5–15)
BUN: 24 mg/dL — ABNORMAL HIGH (ref 6–20)
CO2: 26 mmol/L (ref 22–32)
Calcium: 8.7 mg/dL — ABNORMAL LOW (ref 8.9–10.3)
Chloride: 102 mmol/L (ref 98–111)
Creatinine, Ser: 1.08 mg/dL (ref 0.61–1.24)
GFR, Estimated: >60 mL/min (ref >60)
Glucose, Bld: 89 mg/dL (ref 70–99)
Potassium: 3.8 mmol/L (ref 3.5–5.1)
Sodium: 136 mmol/L (ref 135–145)
Total Bilirubin: 0.4 mg/dL (ref 0.3–1.2)
Total Protein: 5.6 g/dL — ABNORMAL LOW (ref 6.5–8.1)

## 2021-05-14 LAB — CBC
HCT: 38.8 % — ABNORMAL LOW (ref 39.0–52.0)
Hemoglobin: 12.9 g/dL — ABNORMAL LOW (ref 13.0–17.0)
MCH: 29.3 pg (ref 26.0–34.0)
MCHC: 33.2 g/dL (ref 30.0–36.0)
MCV: 88 fL (ref 80.0–100.0)
Platelets: 373 10*3/uL (ref 150–400)
RBC: 4.41 MIL/uL (ref 4.22–5.81)
RDW: 12.3 % (ref 11.5–15.5)
WBC: 8.4 10*3/uL (ref 4.0–10.5)
nRBC: 0 % (ref 0.0–0.2)

## 2021-05-14 LAB — GLUCOSE, CAPILLARY
Glucose-Capillary: 108 mg/dL — ABNORMAL HIGH (ref 70–99)
Glucose-Capillary: 108 mg/dL — ABNORMAL HIGH (ref 70–99)
Glucose-Capillary: 108 mg/dL — ABNORMAL HIGH (ref 70–99)

## 2021-05-14 LAB — SURGICAL PCR SCREEN
MRSA, PCR: NEGATIVE
Staphylococcus aureus: NEGATIVE

## 2021-05-14 SURGERY — PARS PLANA VITRECTOMY WITH 25 GAUGE
Anesthesia: Monitor Anesthesia Care | Laterality: Right

## 2021-05-14 MED ORDER — SODIUM CHLORIDE 0.9 % IV SOLN: INTRAVENOUS | Status: DC

## 2021-05-14 MED ORDER — ACETAMINOPHEN 500 MG PO TABS
1000.0000 mg | ORAL_TABLET | Freq: Once | ORAL | Status: AC
  Administered 2021-05-14: 1000 mg via ORAL
  Filled 2021-05-14: qty 2

## 2021-05-14 MED ORDER — ATROPINE SULFATE 1 % OP SOLN
1.0000 [drp] | OPHTHALMIC | Status: AC
  Administered 2021-05-14 (×3): 1 [drp] via OPHTHALMIC
  Filled 2021-05-14: qty 5

## 2021-05-14 MED ORDER — ATROPINE SULFATE 1 % OP SOLN
OPHTHALMIC | Status: AC
  Filled 2021-05-14: qty 5

## 2021-05-14 MED ORDER — HYALURONIDASE HUMAN 150 UNIT/ML IJ SOLN
INTRAMUSCULAR | Status: AC
  Filled 2021-05-14: qty 1

## 2021-05-14 MED ORDER — TOBRAMYCIN-DEXAMETHASONE 0.3-0.1 % OP OINT
TOPICAL_OINTMENT | OPHTHALMIC | Status: AC
  Filled 2021-05-14: qty 3.5

## 2021-05-14 MED ORDER — PROPOFOL 10 MG/ML IV BOLUS
INTRAVENOUS | Status: AC
  Filled 2021-05-14: qty 20

## 2021-05-14 MED ORDER — LISINOPRIL 10 MG PO TABS
10.0000 mg | ORAL_TABLET | Freq: Once | ORAL | Status: AC
  Administered 2021-05-14: 10 mg via ORAL

## 2021-05-14 MED ORDER — LISINOPRIL 10 MG PO TABS
ORAL_TABLET | ORAL | Status: AC
  Filled 2021-05-14: qty 1

## 2021-05-14 MED ORDER — SODIUM HYALURONATE 10 MG/ML IO SOLUTION
PREFILLED_SYRINGE | INTRAOCULAR | Status: AC
  Filled 2021-05-14: qty 0.85

## 2021-05-14 MED ORDER — FENTANYL CITRATE (PF) 250 MCG/5ML IJ SOLN
INTRAMUSCULAR | Status: AC
  Filled 2021-05-14: qty 5

## 2021-05-14 MED ORDER — PHENYLEPHRINE HCL 2.5 % OP SOLN
1.0000 [drp] | OPHTHALMIC | Status: AC
  Administered 2021-05-14 (×3): 1 [drp] via OPHTHALMIC
  Filled 2021-05-14: qty 2

## 2021-05-14 MED ORDER — MIDAZOLAM HCL 2 MG/2ML IJ SOLN
INTRAMUSCULAR | Status: AC
  Filled 2021-05-14: qty 2

## 2021-05-14 MED ORDER — ARMC OPHTHALMIC DILATING DROPS
1.0000 "application " | OPHTHALMIC | Status: DC
  Filled 2021-05-14: qty 0.5

## 2021-05-14 MED ORDER — DEXAMETHASONE SODIUM PHOSPHATE 10 MG/ML IJ SOLN
INTRAMUSCULAR | Status: AC
  Filled 2021-05-14: qty 1

## 2021-05-14 MED ORDER — NA CHONDROIT SULF-NA HYALURON 40-30 MG/ML IO SOSY
INTRAOCULAR | Status: AC
  Filled 2021-05-14: qty 0.5

## 2021-05-14 MED ORDER — BSS IO SOLN
INTRAOCULAR | Status: AC
  Filled 2021-05-14: qty 15

## 2021-05-14 MED ORDER — INDOCYANINE GREEN 25 MG IV SOLR
INTRAVENOUS | Status: AC
  Filled 2021-05-14: qty 10

## 2021-05-14 MED ORDER — ORAL CARE MOUTH RINSE: 15.0000 mL | Freq: Once | OROMUCOSAL | Status: AC

## 2021-05-14 MED ORDER — LIDOCAINE HCL 2 % IJ SOLN
INTRAMUSCULAR | Status: AC
  Filled 2021-05-14: qty 20

## 2021-05-14 MED ORDER — CHLORHEXIDINE GLUCONATE 0.12 % MT SOLN
15.0000 mL | Freq: Once | OROMUCOSAL | Status: AC
  Administered 2021-05-14: 15 mL via OROMUCOSAL
  Filled 2021-05-14: qty 15

## 2021-05-14 NOTE — Progress Notes (Signed)
BP continue to be high after Lisinopril was administered (194/117) - Dr. Jean Rosenthal made aware.

## 2021-05-14 NOTE — Progress Notes (Signed)
CBG 108 @ 08:58 o'clock. Will continue to monitor.

## 2021-05-14 NOTE — Progress Notes (Signed)
Per Dr. Jean Rosenthal, patient's surgery was canceled due to high BP. Patient was instructed to call his PCP today and check with him how to adjust his BP treatment. Also, patient will call Dr. Allyne Gee office to reschedule his surgery. Patient verbalized understanding. Before patient was discharged BP was 157/111. Patient denies any distress, any discomfort. Patient was transported to the car via wheelchair.

## 2021-05-14 NOTE — Progress Notes (Signed)
BP in short stay elevated: 176.125; 190/123; 192/120. Patient is on Lisinopril at home and he verbalized that he had last dose of Lisinopril two days ago. Per Dr. Glennon Mac verbal order, patient received Lisinopril 10 mg at 06:57 o'clock.   CBG was rechecked at 7 AM - 108. Per Dr. Glennon Mac will continue to monitor. No Insulin at this time.

## 2021-05-14 NOTE — Progress Notes (Signed)
CBG in short stay 108. Patient has Diabetes type I and he had this morning at 5 AM Novolin 70/30 - 25 units. Per Dr. Maple Hudson, we will recheck CBG at 7 AM. No insulin at this time.

## 2021-05-26 NOTE — ED Provider Notes (Addendum)
[facility] Medical Screening Exam  Patient Name: Raymond [name] MRN: [id number] Date of Evaluation: 05/26/21 Chief Complaint:   Diagnosis:  Final diagnoses:  Abuse of cough or cold remedies    History of Present illness: Raymond [name] is a 36 y.o. male with a history of depression, anxiety, borderline personality disorder, DM Type I, and HTN who presents to [facility] voluntarily as a walk-in with request for assistance with abuse of OTC cough medicine. Patient reports that he has been abusing various OTC cough products daily for 14 years. He states "I use any of them." He denies specific abuse of dextromethorphan or pseudoephedrine containing products. Patient reports intermittent suicidal ideations that have occurred throughout his life. He denies any suicidal intent or plan and states that he can contract for safety if discharged home.  Patient is hypertensive. He denies chest pain, SOB, headache, dizziness, diaphoresis. He reports that he took lisinopril 20 mg this morning. B/P re-check 174/112 HR 110. Patient reports that he takes Insulin 70/30 50 units in the AM and 50 units in the PM. He states that he has taken his AM and PM insulin for today. No symptoms of hypo/hyperglycemia noted on assessment. Patient encouraged to follow up with his PCP for HTN.   Reports that the is followed by Anastasio Champion and Lavinia Sharps at [facility] for therapy and medication management. States that he is ot prescribed any psychotropics.   On evaluation, patient is alert and oriented x 4. He is calm and cooperative. Eye contact is fair. He is neatly groomed. Thought process is coherent, goal directed, and linear. Thought content is logical. He denies auditory and visual hallucinations. No indication that he is responding to internal stimuli. Endorses passive SI without any intent or plan. States that he can contract for safety if discharged. Denies homicidal ideations.  Reports history of marijuana use--last use December. Reports occasional use of alcohol--last use 2 weeks ago. Denies use of cocaine, methamphetamines, and other substances.  AC provided patient with a taxi voucher.   After patient was discharged, front desk staff reported that the patient was watching porn on high volume in the lobby. Staff report that they requested he stop watching porn and he refused. Patient was escorted out of the facility by [facility] security and [facility] security.   Psychiatric Specialty Exam  Presentation  General Appearance:Appropriate for Environment; Fairly Groomed  Eye Contact:Fair  Speech:Clear and Coherent; Normal Rate  Speech Volume:Normal  Handedness:No data recorded  Mood and Affect  Mood:Anxious; Depressed  Affect:Congruent   Thought Process  Thought Processes:Coherent; Goal Directed; Linear  Descriptions of Associations:Intact  Orientation:Full (Time, Place and Person)  Thought Content:Logical    Hallucinations:None  Ideas of Reference:None  Suicidal Thoughts:Yes, Passive Without Intent; Without Plan  Homicidal Thoughts:No   Sensorium  Memory:Immediate Good; Recent Good; Remote Good  Judgment:Fair  Insight:Fair   Executive Functions  Concentration:Good  Attention Span:Good  Recall:Good  Fund of Knowledge:Good  Language:Good   Psychomotor Activity  Psychomotor Activity:Normal   Assets  Assets:Communication Skills; Desire for Improvement; Housing; Resilience; Social Support   Sleep  Sleep:Good  Number of hours: No data recorded  Nutritional Assessment (For OBS and FBC admissions only) Has the patient had a weight loss or gain of 10 pounds or more in the last 3 months?: No Has the patient had a decrease in food intake/or appetite?: No Does the patient have dental problems?: No Does the patient have eating habits  or behaviors that may be indicators of an eating disorder including binging or  inducing vomiting?: No Has the patient recently lost weight without trying?: 0 Has the patient been eating poorly because of a decreased appetite?: 0 Malnutrition Screening Tool Score: 0    Physical Exam: Physical Exam Constitutional:      General: He is not in acute distress.    Appearance: He is not ill-appearing, toxic-appearing or diaphoretic.  HENT:     Head: Normocephalic.     Right Ear: External ear normal.     Left Ear: External ear normal.  Eyes:     Pupils: Pupils are equal, round, and reactive to light.  Cardiovascular:     Rate and Rhythm: Normal rate.  Pulmonary:     Effort: Pulmonary effort is normal. No respiratory distress.  Musculoskeletal:        General: Normal range of motion.  Skin:    General: Skin is warm and dry.  Neurological:     General: No focal deficit present.     Mental Status: He is alert and oriented to person, place, and time.  Psychiatric:        Mood and Affect: Mood is anxious and depressed.        Speech: Speech normal.        Behavior: Behavior is cooperative.        Thought Content: Thought content is not paranoid or delusional. Thought content includes suicidal ideation. Thought content does not include homicidal ideation. Thought content does not include suicidal plan.   Review of Systems  Constitutional:  Negative for chills, diaphoresis, fever, malaise/fatigue and weight loss.  HENT:  Negative for congestion.   Respiratory:  Negative for cough and shortness of breath.   Cardiovascular:  Negative for chest pain and palpitations.  Gastrointestinal:  Negative for diarrhea, nausea and vomiting.  Neurological:  Negative for dizziness, seizures, loss of consciousness, weakness and headaches.  Psychiatric/Behavioral:  Positive for depression, substance abuse and suicidal ideas. Negative for hallucinations and memory loss. The patient is nervous/anxious and has insomnia.   All other systems reviewed and are negative.  Blood pressure (!)  174/112, pulse (!) 110, temperature 98.9 F (37.2 C), temperature source Oral, resp. rate 18, SpO2 99 %. There is no height or weight on file to calculate BMI.  Musculoskeletal: Strength & Muscle Tone: within normal limits Gait & Station: normal Patient leans: N/A   [facility] MSE Discharge Disposition for Follow up and Recommendations: Based on my evaluation the patient does not appear to have an emergency medical condition and can be discharged with resources and follow up care in outpatient services for Medication Management and Individual Therapy  [facility].    Jackelyn Poling, NP 05/26/2021, 11:46 PM

## 2021-05-27 ENCOUNTER — Ambulatory Visit (HOSPITAL_COMMUNITY)
Admission: EM | Admit: 2021-05-27 | Discharge: 2021-05-27 | Disposition: A | Discharge status: Home / Self Care | Payer: No Payment, Other | Attending: Nurse Practitioner | Admitting: Nurse Practitioner

## 2021-05-27 ENCOUNTER — Encounter (HOSPITAL_COMMUNITY): Payer: Self-pay | Admitting: Nurse Practitioner

## 2021-05-27 DIAGNOSIS — F419 Anxiety disorder, unspecified: Secondary | ICD-10-CM | POA: Insufficient documentation

## 2021-05-27 DIAGNOSIS — E108 Type 1 diabetes mellitus with unspecified complications: Secondary | ICD-10-CM | POA: Insufficient documentation

## 2021-05-27 DIAGNOSIS — I10 Essential (primary) hypertension: Secondary | ICD-10-CM | POA: Insufficient documentation

## 2021-05-27 DIAGNOSIS — R45851 Suicidal ideations: Secondary | ICD-10-CM | POA: Insufficient documentation

## 2021-05-27 DIAGNOSIS — F558 Abuse of other non-psychoactive substances: Secondary | ICD-10-CM | POA: Insufficient documentation

## 2021-05-27 DIAGNOSIS — F603 Borderline personality disorder: Secondary | ICD-10-CM | POA: Insufficient documentation

## 2021-05-27 DIAGNOSIS — F1090 Alcohol use, unspecified, uncomplicated: Secondary | ICD-10-CM | POA: Insufficient documentation

## 2021-05-27 NOTE — Discharge Instructions (Addendum)
  Discharge recommendations:  Patient is to take medications as prescribed. Please see information for follow-up appointment with psychiatry and therapy. Please follow up with your primary care provider for all medical related needs.   Therapy: We recommend that patient participate in individual therapy to address mental health concerns.   Safety:  The patient should abstain from use of illicit substances/drugs and abuse of any medications. If symptoms worsen or do not continue to improve or if the patient becomes actively suicidal or homicidal then it is recommended that the patient return to the closest hospital emergency department, the [facility], or call 911 for further evaluation and treatment. National Suicide Prevention Lifeline 1-800-SUICIDE or 1-800-273-8255.  About 988 988 offers 24/7 access to trained crisis counselors who can help people experiencing mental health-related distress. People can call or text 988 or chat 988lifeline.org for themselves or if they are worried about a loved one who may need crisis support.  

## 2021-05-27 NOTE — Progress Notes (Signed)
°   05/26/21 2200  [facility] Triage Screening (Walk-ins at [facility] only)  How Did You Hear About [place]? Self  What Is the Reason for Your Visit/Call Today? Raymond [name] is a 36 year old male presenting voluntary as a walk-in to [facility] requesting detox from Over-the-counter cough/cold medicine. Patient history includes, depression, anxiety, borderline personality disorder, DM Type I, and HTN. Patient reported intermittent SI throughout his entire life. Patient denied SI with plan and states that he would not kill himself. Patient reported using 1-2 boxes of cough/cold boxes daily for the past 14 years. Patient reported worsening depressive symptoms. Patient reported attempted overdose on tylenol 4 or 5 years ago with 1 psych hospitalization 4 or 5 years ago. Patient reported currently seeing Littie Deeds and Lavinia Sharps at [facility] the [facility] for therapy and medication management. Patient reported that he is not taking his psych medications that he is prescribed. Patient was calm and cooperative during assessment. Patient contracted for safety.  How Long Has This Been Causing You Problems? > than 6 months  Have You Recently Had Any Thoughts About Hurting Yourself? No  Are You Planning to Commit Suicide/Harm Yourself At This time? No  Have you Recently Had Thoughts About Hurting Someone Karolee Ohs? No  Are You Planning To Harm Someone At This Time? No  Are you currently experiencing any auditory, visual or other hallucinations? No  Have You Used Any Alcohol or Drugs in the Past 24 Hours? Yes  How long ago did you use Drugs or Alcohol? today  What Did You Use and How Much? cough/cold tablets  Do you have any current medical co-morbidities that require immediate attention? No  Clinician description of patient physical appearance/behavior: casual / cooperative  What Do You Feel Would Help You the Most Today? Treatment for Depression or other mood problem  If access to [facility] was not available, would  you have sought care in the Emergency Department? Yes  Determination of Need Routine (7 days)  Options For Referral Outpatient Therapy;Medication Management;Chemical Dependency Intensive Outpatient Therapy (CDIOP)

## 2021-06-09 ENCOUNTER — Other Ambulatory Visit: Payer: Self-pay

## 2021-06-09 ENCOUNTER — Encounter (HOSPITAL_COMMUNITY): Payer: Self-pay | Admitting: Ophthalmology

## 2021-06-09 NOTE — Progress Notes (Signed)
Spoke with pt for pre-op call. I talked with him when his surgery was scheduled in late January. It was cancelled because his BP was elevated. He states that he has seen his PCP and they increased his Lisinopril to 20 mg daily and he states his BP is doing much better. He states his PCP called Dr Baird Cancer and gave him the ok for surgery.  ? ?Pt is a type 1 diabetic. Instructed pt to take 70% of his regular dose Novolin 70/30 insulin Wednesday PM, he will take 35 units that evening.  Day of surgery instructed him to take 50% of his regular dose of Novolin 70/30 insulin and he will take 25 units the morning of surgery. Instructed pt to check his blood sugar Thursday AM when he wakes up.  If blood sugar is 70 or below, treat with 1/2 cup of clear juice (apple or cranberry) and recheck blood sugar 15 minutes after drinking juice. If blood sugar continues to be 70 or below notify your nurse on arrival to Short Stay. ?

## 2021-06-10 NOTE — Anesthesia Preprocedure Evaluation (Addendum)
Anesthesia Evaluation  ?Patient identified by MRN, date of birth, ID band ?Patient awake ? ? ? ?Reviewed: ?Allergy & Precautions, NPO status , Patient's Chart, lab work & pertinent test results ? ?Airway ?Mallampati: II ? ?TM Distance: >3 FB ?Neck ROM: Full ? ? ? Dental ?no notable dental hx. ? ?  ?Pulmonary ? ?  ?Pulmonary exam normal ?breath sounds clear to auscultation ? ? ? ? ? ? Cardiovascular ?hypertension, Pt. on medications ?Normal cardiovascular exam ?Rhythm:Regular Rate:Normal ? ? ?  ?Neuro/Psych ? Headaches, PSYCHIATRIC DISORDERS Depression   ? GI/Hepatic ?(+)  ?  ? substance abuse ? alcohol use and methamphetamine use,   ?Endo/Other  ?diabetes, Poorly Controlled, Type 1, Insulin Dependent ? Renal/GU ?  ?negative genitourinary ?  ?Musculoskeletal ?negative musculoskeletal ROS ?(+)  ? Abdominal ?  ?Peds ?negative pediatric ROS ?(+)  Hematology ?negative hematology ROS ?(+)   ?Anesthesia Other Findings ? ? Reproductive/Obstetrics ?negative OB ROS ? ?  ? ? ? ? ? ? ? ? ? ? ? ? ? ?  ?  ? ? ? ? ? ? ? ?Anesthesia Physical ?Anesthesia Plan ? ?ASA: 3 ? ?Anesthesia Plan: MAC  ? ?Post-op Pain Management: Minimal or no pain anticipated and Tylenol PO (pre-op)*  ? ?Induction: Intravenous ? ?PONV Risk Score and Plan: 2 and Treatment may vary due to age or medical condition ? ?Airway Management Planned: Natural Airway ? ?Additional Equipment:  ? ?Intra-op Plan:  ? ?Post-operative Plan: Extubation in OR ? ?Informed Consent: I have reviewed the patients History and Physical, chart, labs and discussed the procedure including the risks, benefits and alternatives for the proposed anesthesia with the patient or authorized representative who has indicated his/her understanding and acceptance.  ? ? ? ?Dental advisory given ? ?Plan Discussed with: CRNA ? ?Anesthesia Plan Comments: (Case posted as MAC. Will discuss with surgeon. Backup plan. GA/LMA. Norton Blizzard, MD  ?)  ? ? ? ? ? ?Anesthesia  Quick Evaluation ? ?

## 2021-06-11 ENCOUNTER — Encounter (HOSPITAL_COMMUNITY): Payer: Self-pay | Admitting: Ophthalmology

## 2021-06-11 ENCOUNTER — Encounter (HOSPITAL_COMMUNITY)
Admission: RE | Disposition: A | Discharge status: Home / Self Care | Payer: Self-pay | Source: Home / Self Care | Attending: Ophthalmology

## 2021-06-11 ENCOUNTER — Ambulatory Visit (HOSPITAL_BASED_OUTPATIENT_CLINIC_OR_DEPARTMENT_OTHER): Payer: Self-pay | Admitting: Anesthesiology

## 2021-06-11 ENCOUNTER — Ambulatory Visit (HOSPITAL_COMMUNITY): Payer: Self-pay | Admitting: Anesthesiology

## 2021-06-11 ENCOUNTER — Ambulatory Visit (HOSPITAL_COMMUNITY)
Admission: RE | Admit: 2021-06-11 | Discharge: 2021-06-11 | Disposition: A | Discharge status: Home / Self Care | Payer: Self-pay | Attending: Ophthalmology | Admitting: Ophthalmology

## 2021-06-11 ENCOUNTER — Other Ambulatory Visit: Payer: Self-pay

## 2021-06-11 DIAGNOSIS — Z794 Long term (current) use of insulin: Secondary | ICD-10-CM | POA: Insufficient documentation

## 2021-06-11 DIAGNOSIS — E103521 Type 1 diabetes mellitus with proliferative diabetic retinopathy with traction retinal detachment involving the macula, right eye: Secondary | ICD-10-CM | POA: Insufficient documentation

## 2021-06-11 DIAGNOSIS — I10 Essential (primary) hypertension: Secondary | ICD-10-CM | POA: Insufficient documentation

## 2021-06-11 DIAGNOSIS — E10319 Type 1 diabetes mellitus with unspecified diabetic retinopathy without macular edema: Secondary | ICD-10-CM

## 2021-06-11 DIAGNOSIS — H4311 Vitreous hemorrhage, right eye: Secondary | ICD-10-CM

## 2021-06-11 DIAGNOSIS — F32A Depression, unspecified: Secondary | ICD-10-CM | POA: Insufficient documentation

## 2021-06-11 DIAGNOSIS — E1065 Type 1 diabetes mellitus with hyperglycemia: Secondary | ICD-10-CM

## 2021-06-11 HISTORY — PX: PARS PLANA VITRECTOMY: SHX2166

## 2021-06-11 HISTORY — PX: PHOTOCOAGULATION WITH LASER: SHX6027

## 2021-06-11 LAB — GLUCOSE, CAPILLARY
Glucose-Capillary: 223 mg/dL — ABNORMAL HIGH (ref 70–99)
Glucose-Capillary: 163 mg/dL — ABNORMAL HIGH (ref 70–99)
Glucose-Capillary: 228 mg/dL — ABNORMAL HIGH (ref 70–99)

## 2021-06-11 SURGERY — PARS PLANA VITRECTOMY WITH 25 GAUGE
Anesthesia: General | Laterality: Right

## 2021-06-11 MED ORDER — FENTANYL CITRATE (PF) 250 MCG/5ML IJ SOLN
INTRAMUSCULAR | Status: DC | PRN
  Administered 2021-06-11: 25 ug via INTRAVENOUS
  Administered 2021-06-11: 50 ug via INTRAVENOUS
  Administered 2021-06-11: 25 ug via INTRAVENOUS

## 2021-06-11 MED ORDER — PROPOFOL 500 MG/50ML IV EMUL
INTRAVENOUS | Status: DC | PRN
  Administered 2021-06-11: 80 ug/kg/min via INTRAVENOUS

## 2021-06-11 MED ORDER — BALANCED SALT IO SOLN
INTRAOCULAR | Status: DC | PRN
  Administered 2021-06-11: 15 mL via INTRAOCULAR

## 2021-06-11 MED ORDER — CHLORHEXIDINE GLUCONATE 0.12 % MT SOLN
15.0000 mL | Freq: Once | OROMUCOSAL | Status: AC
  Administered 2021-06-11: 15 mL via OROMUCOSAL
  Filled 2021-06-11: qty 15

## 2021-06-11 MED ORDER — PHENYLEPHRINE HCL 2.5 % OP SOLN
1.0000 [drp] | OPHTHALMIC | Status: AC | PRN
  Administered 2021-06-11 (×3): 1 [drp] via OPHTHALMIC
  Filled 2021-06-11: qty 2

## 2021-06-11 MED ORDER — MIDAZOLAM HCL 2 MG/2ML IJ SOLN
INTRAMUSCULAR | Status: DC | PRN
  Administered 2021-06-11: 2 mg via INTRAVENOUS

## 2021-06-11 MED ORDER — ATROPINE SULFATE 1 % OP SOLN
1.0000 [drp] | OPHTHALMIC | Status: AC | PRN
  Administered 2021-06-11 (×3): 1 [drp] via OPHTHALMIC
  Filled 2021-06-11: qty 5

## 2021-06-11 MED ORDER — DEXAMETHASONE SODIUM PHOSPHATE 10 MG/ML IJ SOLN
INTRAMUSCULAR | Status: DC | PRN
Start: 2021-06-11 — End: 2021-06-11
  Administered 2021-06-11: 10 mg via INTRAVENOUS

## 2021-06-11 MED ORDER — LACTATED RINGERS IV SOLN: INTRAVENOUS | Status: DC

## 2021-06-11 MED ORDER — ATROPINE SULFATE 1 % OP SOLN
OPHTHALMIC | Status: DC | PRN
  Administered 2021-06-11: 1 [drp] via OPHTHALMIC

## 2021-06-11 MED ORDER — LIDOCAINE HCL 2 % IJ SOLN
INTRAMUSCULAR | Status: AC
  Filled 2021-06-11: qty 20

## 2021-06-11 MED ORDER — CEFAZOLIN SUBCONJUNCTIVAL INJECTION 100 MG/0.5 ML
INJECTION | SUBCONJUNCTIVAL | Status: DC | PRN
  Administered 2021-06-11: 50 mg via SUBCONJUNCTIVAL

## 2021-06-11 MED ORDER — BSS PLUS IO SOLN
INTRAOCULAR | Status: AC
  Filled 2021-06-11: qty 500

## 2021-06-11 MED ORDER — ONDANSETRON HCL 4 MG/2ML IJ SOLN
INTRAMUSCULAR | Status: DC | PRN
  Administered 2021-06-11: 4 mg via INTRAVENOUS

## 2021-06-11 MED ORDER — VASOPRESSIN 20 UNIT/ML IV SOLN
INTRAVENOUS | Status: AC
  Filled 2021-06-11: qty 1

## 2021-06-11 MED ORDER — BUPIVACAINE HCL (PF) 0.75 % IJ SOLN
INTRAMUSCULAR | Status: AC
  Filled 2021-06-11: qty 10

## 2021-06-11 MED ORDER — LIDOCAINE HCL 2 % IJ SOLN
INTRAMUSCULAR | Status: DC | PRN
  Administered 2021-06-11: 7 mL via OPHTHALMIC

## 2021-06-11 MED ORDER — PROPOFOL 10 MG/ML IV BOLUS
INTRAVENOUS | Status: AC
  Filled 2021-06-11: qty 20

## 2021-06-11 MED ORDER — CEFAZOLIN SUBCONJUNCTIVAL INJECTION 100 MG/0.5 ML
100.0000 mg | INJECTION | SUBCONJUNCTIVAL | Status: DC
  Filled 2021-06-11 (×3): qty 5

## 2021-06-11 MED ORDER — DEXAMETHASONE SODIUM PHOSPHATE 10 MG/ML IJ SOLN
INTRAMUSCULAR | Status: DC | PRN
  Administered 2021-06-11: 10 mg

## 2021-06-11 MED ORDER — PROPOFOL 10 MG/ML IV BOLUS
INTRAVENOUS | Status: DC | PRN
  Administered 2021-06-11 (×2): 50 mg via INTRAVENOUS
  Administered 2021-06-11: 100 mg via INTRAVENOUS

## 2021-06-11 MED ORDER — TOBRAMYCIN-DEXAMETHASONE 0.3-0.1 % OP OINT
TOPICAL_OINTMENT | OPHTHALMIC | Status: AC
  Filled 2021-06-11: qty 3.5

## 2021-06-11 MED ORDER — FENTANYL CITRATE (PF) 250 MCG/5ML IJ SOLN
INTRAMUSCULAR | Status: AC
  Filled 2021-06-11: qty 5

## 2021-06-11 MED ORDER — DEXAMETHASONE SODIUM PHOSPHATE 10 MG/ML IJ SOLN
INTRAMUSCULAR | Status: AC
  Filled 2021-06-11: qty 1

## 2021-06-11 MED ORDER — ORAL CARE MOUTH RINSE: 15.0000 mL | Freq: Once | OROMUCOSAL | Status: AC

## 2021-06-11 MED ORDER — PHENYLEPHRINE 40 MCG/ML (10ML) SYRINGE FOR IV PUSH (FOR BLOOD PRESSURE SUPPORT)
PREFILLED_SYRINGE | INTRAVENOUS | Status: AC
  Filled 2021-06-11: qty 10

## 2021-06-11 MED ORDER — DEXMEDETOMIDINE (PRECEDEX) IN NS 20 MCG/5ML (4 MCG/ML) IV SYRINGE
PREFILLED_SYRINGE | INTRAVENOUS | Status: DC | PRN
  Administered 2021-06-11 (×2): 8 ug via INTRAVENOUS
  Administered 2021-06-11: 4 ug via INTRAVENOUS

## 2021-06-11 MED ORDER — TOBRAMYCIN-DEXAMETHASONE 0.3-0.1 % OP OINT
TOPICAL_OINTMENT | OPHTHALMIC | Status: DC | PRN
  Administered 2021-06-11: 1 via OPHTHALMIC

## 2021-06-11 MED ORDER — PHENYLEPHRINE 40 MCG/ML (10ML) SYRINGE FOR IV PUSH (FOR BLOOD PRESSURE SUPPORT)
PREFILLED_SYRINGE | INTRAVENOUS | Status: DC | PRN
  Administered 2021-06-11: 160 ug via INTRAVENOUS
  Administered 2021-06-11: 80 ug via INTRAVENOUS
  Administered 2021-06-11: 160 ug via INTRAVENOUS

## 2021-06-11 MED ORDER — ACETAMINOPHEN 500 MG PO TABS
1000.0000 mg | ORAL_TABLET | Freq: Once | ORAL | Status: AC
  Administered 2021-06-11: 1000 mg via ORAL
  Filled 2021-06-11: qty 2

## 2021-06-11 MED ORDER — MIDAZOLAM HCL 2 MG/2ML IJ SOLN
INTRAMUSCULAR | Status: AC
  Filled 2021-06-11: qty 2

## 2021-06-11 MED ORDER — PHENYLEPHRINE HCL-NACL 20-0.9 MG/250ML-% IV SOLN
INTRAVENOUS | Status: DC | PRN
  Administered 2021-06-11: 50 ug/min via INTRAVENOUS

## 2021-06-11 MED ORDER — EPINEPHRINE PF 1 MG/ML IJ SOLN
INTRAMUSCULAR | Status: AC
  Filled 2021-06-11: qty 1

## 2021-06-11 MED ORDER — VASOPRESSIN 20 UNIT/ML IV SOLN
INTRAVENOUS | Status: DC | PRN
  Administered 2021-06-11 (×4): 1 [IU] via INTRAVENOUS

## 2021-06-11 MED ORDER — INSULIN ASPART 100 UNIT/ML IJ SOLN
0.0000 [IU] | INTRAMUSCULAR | Status: DC | PRN
  Administered 2021-06-11: 3 [IU] via SUBCUTANEOUS

## 2021-06-11 MED ORDER — BEVACIZUMAB CHEMO INJECTION 1.25MG/0.05ML SYRINGE FOR KALEIDOSCOPE
1.2500 mg | Freq: Once | INTRAVITREAL | Status: AC
Start: 2021-06-11 — End: 2021-06-11
  Administered 2021-06-11: 1.25 mg via INTRAVITREAL
  Filled 2021-06-11 (×4): qty 0.1

## 2021-06-11 MED ORDER — BSS IO SOLN
INTRAOCULAR | Status: AC
  Filled 2021-06-11: qty 15

## 2021-06-11 MED ORDER — NA CHONDROIT SULF-NA HYALURON 40-30 MG/ML IO SOSY
INTRAOCULAR | Status: AC
  Filled 2021-06-11: qty 0.5

## 2021-06-11 MED ORDER — TRIAMCINOLONE ACETONIDE 40 MG/ML IJ SUSP
INTRAMUSCULAR | Status: AC
  Filled 2021-06-11: qty 5

## 2021-06-11 MED ORDER — ONDANSETRON HCL 4 MG/2ML IJ SOLN
INTRAMUSCULAR | Status: AC
  Filled 2021-06-11: qty 2

## 2021-06-11 MED ORDER — HYALURONIDASE HUMAN 150 UNIT/ML IJ SOLN
INTRAMUSCULAR | Status: AC
  Filled 2021-06-11: qty 1

## 2021-06-11 MED ORDER — BSS PLUS IO SOLN
INTRAOCULAR | Status: DC | PRN
  Administered 2021-06-11: 500 mL

## 2021-06-11 MED ORDER — SODIUM HYALURONATE 10 MG/ML IO SOLUTION
PREFILLED_SYRINGE | INTRAOCULAR | Status: AC
  Filled 2021-06-11: qty 0.85

## 2021-06-11 MED ORDER — TETRACAINE HCL 0.5 % OP SOLN
OPHTHALMIC | Status: AC
  Filled 2021-06-11: qty 4

## 2021-06-11 MED ORDER — INSULIN ASPART 100 UNIT/ML IJ SOLN
INTRAMUSCULAR | Status: AC
  Filled 2021-06-11: qty 1

## 2021-06-11 MED ORDER — ATROPINE SULFATE 1 % OP SOLN
OPHTHALMIC | Status: AC
  Filled 2021-06-11: qty 5

## 2021-06-11 SURGICAL SUPPLY — 27 items
CANNULA VLV SOFT TIP 25G (OPHTHALMIC) ×1 IMPLANT
CANNULA VLV SOFT TIP 25GA (OPHTHALMIC) ×2 IMPLANT
DRAPE HALF SHEET 40X57 (DRAPES) ×2 IMPLANT
DRAPE RETRACTOR (MISCELLANEOUS) ×2 IMPLANT
GLOVE SURG ENC MOIS LTX SZ7.5 (GLOVE) ×2 IMPLANT
GOWN STRL REUS W/ TWL LRG LVL3 (GOWN DISPOSABLE) ×2 IMPLANT
GOWN STRL REUS W/TWL LRG LVL3 (GOWN DISPOSABLE) ×4
KIT BASIN OR (CUSTOM PROCEDURE TRAY) ×2 IMPLANT
LENS BIOM SUPER VIEW SET DISP (MISCELLANEOUS) ×2 IMPLANT
MARKER SKIN DUAL TIP RULER LAB (MISCELLANEOUS) ×1 IMPLANT
NDL 18GX1X1/2 (RX/OR ONLY) (NEEDLE) ×1 IMPLANT
NDL 25GX 5/8IN NON SAFETY (NEEDLE) ×1 IMPLANT
NDL HYPO 30X.5 LL (NEEDLE) ×1 IMPLANT
NDL RETROBULBAR 25GX1.5 (NEEDLE) ×1 IMPLANT
NEEDLE 18GX1X1/2 (RX/OR ONLY) (NEEDLE) ×2 IMPLANT
NEEDLE 25GX 5/8IN NON SAFETY (NEEDLE) ×2 IMPLANT
NEEDLE HYPO 30X.5 LL (NEEDLE) ×2 IMPLANT
NEEDLE RETROBULBAR 25GX1.5 (NEEDLE) ×2 IMPLANT
NS IRRIG 1000ML POUR BTL (IV SOLUTION) ×2 IMPLANT
PAD ARMBOARD 7.5X6 YLW CONV (MISCELLANEOUS) ×4 IMPLANT
PAK PIK VITRECTOMY CVS 25GA (OPHTHALMIC) ×4 IMPLANT
PROBE LASER ILLUM FLEX CVD 25G (OPHTHALMIC) ×1 IMPLANT
STOCKINETTE IMPERVIOUS 9X36 MD (GAUZE/BANDAGES/DRESSINGS) ×4 IMPLANT
STRIP CLOSURE SKIN 1/4X4 (GAUZE/BANDAGES/DRESSINGS) ×1 IMPLANT
SYR 20ML LL LF (SYRINGE) ×2 IMPLANT
SYR 5ML LL (SYRINGE) ×2 IMPLANT
WATER STERILE IRR 1000ML POUR (IV SOLUTION) ×2 IMPLANT

## 2021-06-11 NOTE — Brief Op Note (Signed)
06/11/2021 ? ?9:00 AM ? ?PATIENT:  Raymond [name]  36 y.o. male ? ?PRE-OPERATIVE DIAGNOSIS:  Proliferative diabetic retinopathy, vitreous hemorrhage right eye ? ?POST-OPERATIVE DIAGNOSIS:  Proliferative diabetic retinopathy, vitreous hemorrhage right eye ? ?PROCEDURE:  Procedure(s) with comments: ?PARS PLANA VITRECTOMY WITH 25 GAUGE (Right) ?PHOTOCOAGULATION WITH LASER (Right) ?AVASTIN INJECTION (Right) - Intravitreal Bevacizumab (Avastin) injection ? ?SURGEON:  Surgeon(s) and Role: ?   Stephannie Li, MD - Primary ? ?PHYSICIAN ASSISTANT:  ? ?ASSISTANTS: none  ? ?ANESTHESIA:   general ? ?EBL: minimal ? ?BLOOD ADMINISTERED:none ? ?DRAINS: none  ? ?LOCAL MEDICATIONS USED:  BUPIVICAINE  ? ?SPECIMEN:  No Specimen ? ?DISPOSITION OF SPECIMEN:  N/A ? ?COUNTS:  YES ? ?TOURNIQUET:  * No tourniquets in log * ? ?DICTATION: .Other Dictation: Dictation Number [id number] ? ?PLAN OF CARE: Discharge to home after PACU ? ?PATIENT DISPOSITION:  PACU - hemodynamically stable. ?  ?Delay start of Pharmacological VTE agent (>24hrs) due to surgical blood loss or risk of bleeding: no ? ?

## 2021-06-11 NOTE — Op Note (Signed)
NAME: Raymond [name], Raymond A. ?MEDICAL RECORD NO: [id number] ?ACCOUNT NO: [id number] ?DATE OF BIRTH: Mar 24, 1986 ?FACILITY: [facility] ?LOCATION: [facility]-PERIOP ?PHYSICIAN: Barbara Cower B. Allyne Gee, MD ? ?Operative Report  ? ?DATE OF PROCEDURE: 06/11/2021 ? ?SURGEON:  Barbara Cower B. Allyne Gee, MD ? ?ANESTHESIA:  General with laryngeal mask inhalation.   ? ?PREOPERATIVE DIAGNOSIS:  Diabetic retinopathy with vitreous hemorrhage, right eye. ? ?POSTOPERATIVE DIAGNOSIS:  Diabetic retinopathy with vitreous hemorrhage, right eye. ? ?PROCEDURE:  A 25-gauge pars plana vitrectomy with endolaser photocoagulation, and intravitreal injection of 2 mg of Avastin. ? ?COMPLICATIONS:  None. ? ?FINDINGS:  There was dense vitreous hemorrhage and proliferative diabetic retinopathy and poor peripheral retinal circulation of the right eye. ? ?DESCRIPTION OF PROCEDURE:  The patient was identified in the preoperative holding area.  All questions were answered.  The surgical site was marked.  Signed consent was placed on the chart.  He was then taken to the operating room.  At that point in  ?time, he was sedated by anesthesia.  The right eye was anesthetized with retrobulbar block consisting of a 1:1 mixture of 0.75% bupivacaine and 1% lidocaine and 150 units of Hylenex.  Needle was removed.  Excellent akinesia and anesthesia was obtained.   ?While the patient was being prepped, it was noted the patient had difficulty holding still and the decision was made in the best interest of the patient to convert to general anesthesia.  This was performed without difficulty.  The site was then prepped  ?and draped after the patient was stabilized under general anesthesia by the Anesthesia team. After the site was prepped and draped in the usual sterile fashion for ocular surgery, a Lieberman speculum was placed between the right upper and lower eyelids  ?for exposure.  Transconjunctival cannulas were introduced in the superior temporal, superior nasal and inferior temporal quadrants.   The trocar was removed.  The cannula was remaining in place.  Infusion cannula was attached to the inferior temporal  ?cannula and confirmed to be in the vitreous cavity prior to its use.  The eye then underwent a core vitrectomy with the vitreous cutter and light pipe.  Core vitreous was removed.  The posterior hyaloid was then engaged and elevated using the vitrector.  ? The posterior hyaloid was then trimmed out to the periphery.  At that point in time, endolaser photocoagulation was placed using the endolaser probe.  A full panretinal pattern was placed without difficulty.  The eye was then inspected with scleral  ?depression.  No retinal tears or detachments were seen by careful scleral depressed exam.  Therefore, the eye was injected with intravitreal Avastin using a 25-gauge needle through one of the existing cannulas.  The needle was used and removed.  Next,  ?the cannulas were removed from the sclerotomies.  Sclerotomies were inspected and found to be watertight.  The eye was then treated with subconjunctival injections of 15 mg of Ancef and 1 mg of dexamethasone.  Eye was treated topically with 1 drop of 1%  ?atropine and TobraDex ointment.  Speculum was removed.  Eyelids were cleaned and closed.  The eye was then patched and shielded.  The patient awoke from general anesthesia without difficulty and was taken to recovery in excellent condition having  ?tolerated the procedure well.  ? ? ? ?SUJ ?D: 06/11/2021 9:05:33 am T: 06/11/2021 9:28:00 am  ?JOB: [id number]/ [id number]  ?

## 2021-06-11 NOTE — Transfer of Care (Signed)
Immediate Anesthesia Transfer of Care Note ? ?Patient: Raymond [name] ? ?Procedure(s) Performed: PARS PLANA VITRECTOMY WITH 25 GAUGE (Right) ?PHOTOCOAGULATION WITH LASER (Right) ?AVASTIN INJECTION (Right) ? ?Patient Location: PACU ? ?Anesthesia Type:General ? ?Level of Consciousness: drowsy ? ?Airway & Oxygen Therapy: Patient Spontanous Breathing and Patient connected to nasal cannula oxygen ? ?Post-op Assessment: Report given to RN and Post -op Vital signs reviewed and stable ? ?Post vital signs: Reviewed and stable ? ?Last Vitals:  ?Vitals Value Taken Time  ?BP 92/65 06/11/21 0906  ?Temp    ?Pulse 71 06/11/21 0907  ?Resp 13 06/11/21 0907  ?SpO2 98 % 06/11/21 0907  ?Vitals shown include unvalidated device data. ? ?Last Pain:  ?Vitals:  ? 06/11/21 0617  ?TempSrc:   ?PainSc: 2   ?   ? ?Patients Stated Pain Goal: 0 (06/11/21 0617) ? ?Complications: No notable events documented. ?

## 2021-06-11 NOTE — Anesthesia Postprocedure Evaluation (Signed)
Anesthesia Post Note ? ?Patient: Raymond [name] ? ?Procedure(s) Performed: PARS PLANA VITRECTOMY WITH 25 GAUGE (Right) ?PHOTOCOAGULATION WITH LASER (Right) ?AVASTIN INJECTION (Right) ? ?  ? ?Patient location during evaluation: PACU ?Anesthesia Type: General ?Level of consciousness: awake ?Pain management: pain level controlled ?Vital Signs Assessment: post-procedure vital signs reviewed and stable ?Respiratory status: spontaneous breathing and respiratory function stable ?Cardiovascular status: stable ?Postop Assessment: no apparent nausea or vomiting ?Anesthetic complications: no ? ? ?No notable events documented. ? ?Last Vitals:  ?Vitals:  ? 06/11/21 1006 06/11/21 1020  ?BP: 114/75 130/87  ?Pulse: 81 81  ?Resp: 14 15  ?Temp:  36.5 ?C  ?SpO2: 98% 99%  ?  ?Last Pain:  ?Vitals:  ? 06/11/21 1006  ?TempSrc:   ?PainSc: 0-No pain  ? ? ?  ?  ?  ?  ?  ?  ? ?Raymond [name] ? ? ? ? ?

## 2021-06-11 NOTE — Discharge Instructions (Signed)
Go to appointment with Dr. Allyne Gee tomorrow 3/17 at 9am. ?Leave eye dressing on, he will take it off at the appointment ?

## 2021-06-11 NOTE — Anesthesia Procedure Notes (Signed)
Procedure Name: LMA Insertion ?Date/Time: 06/11/2021 8:03 AM ?Performed by: Zollie Beckers, CRNA ?Pre-anesthesia Checklist: Patient identified, Emergency Drugs available, Suction available and Patient being monitored ?Patient Re-evaluated:Patient Re-evaluated prior to induction ?Oxygen Delivery Method: Circle System Utilized ?Preoxygenation: Pre-oxygenation with 100% oxygen ?Induction Type: IV induction ?Ventilation: Mask ventilation without difficulty ?LMA: LMA inserted ?LMA Size: 4.0 ?Number of attempts: 1 ?Placement Confirmation: positive ETCO2 ?Tube secured with: Tape ?Dental Injury: Teeth and Oropharynx as per pre-operative assessment  ? ? ? ? ?

## 2021-06-11 NOTE — H&P (Signed)
Raymond [name] is an 36 y.o. male.   ?Chief Complaint: vision loss OD ?HPI: dx with vit heme and PDR OD ? ?Past Medical History:  ?Diagnosis Date  ? Borderline personality disorder (HCC)   ? COVID   ? has had it twice, last time was in 2022, mild case 2nd time, worse on the first time  ? Diabetes mellitus without complication (HCC)   ? Type 1  ? Gallstones   ? Headache   ? Hypertension   ? Substance abuse (HCC)   ? drug and alcohol abuse since age 26  ? ? ?Past Surgical History:  ?Procedure Laterality Date  ? CHOLECYSTECTOMY    ? WISDOM TOOTH EXTRACTION    ? 2 bottom wisdom teeth taken out  ? ? ?History reviewed. No pertinent family history. ?Social History:  reports that he has never smoked. He has quit using smokeless tobacco.  His smokeless tobacco use included chew. He reports current alcohol use. He reports that he does not currently use drugs after having used the following drugs: Other-see comments, Methamphetamines, and Cocaine. ? ?Allergies:  ?Allergies  ?Allergen Reactions  ? Ibuprofen Anaphylaxis and Swelling  ?  Facial swelling  ? Tramadol Other (See Comments)  ?  Hypoglycemic Shock  ? ? ?Medications Prior to Admission  ?Medication Sig Dispense Refill  ? lisinopril (ZESTRIL) 10 MG tablet Take 1 tablet (10 mg total) by mouth daily. 30 tablet 0  ? Multiple Vitamins-Minerals (ADVANCED DIABETIC MULTIVITAMIN PO) Take 1 tablet by mouth daily.    ? NAPROSYN 500 MG tablet Take 500 mg by mouth 2 (two) times daily as needed for moderate pain, headache or mild pain.    ? NOVOLIN 70/30 RELION (70-30) 100 UNIT/ML injection Inject 20-40 Units into the skin See admin instructions. Inject 40 units into the skin in the morning, 20 units at noon, 30 units at bedtime (Patient taking differently: Inject 20-40 Units into the skin See admin instructions. Takes 50 units in the AM and 50 units at bedtime) 3 mL 12  ? atorvastatin (LIPITOR) 20 MG tablet Take 20 mg by mouth at bedtime.    ? ofloxacin (OCUFLOX) 0.3 % ophthalmic  solution Place 1 drop into the right eye 4 (four) times daily.    ? prednisoLONE acetate (PRED FORTE) 1 % ophthalmic suspension Place 1 drop into the right eye 4 (four) times daily. (Patient not taking: Reported on 05/14/2021)    ? ? ?Results for orders placed or performed during the hospital encounter of 06/11/21 (from the past 48 hour(s))  ?Glucose, capillary     Status: Abnormal  ? Collection Time: 06/11/21  5:48 AM  ?Result Value Ref Range  ? Glucose-Capillary 223 (H) 70 - 99 mg/dL  ?  Comment: Glucose reference range applies only to samples taken after fasting for at least 8 hours.  ? ?No results found. ? ?Review of Systems  ?Eyes:  Positive for visual disturbance.  ?All other systems reviewed and are negative. ? ?Blood pressure (!) 148/94, pulse 86, temperature 97.8 ?F (36.6 ?C), temperature source Oral, resp. rate 18, height 5\' 10"  (1.778 m), weight 81.6 kg, SpO2 100 %. ?Physical Exam ?Constitutional:   ?   Appearance: Normal appearance. He is normal weight.  ?Eyes:  ?   General: Lids are normal.  ?   Extraocular Movements: Extraocular movements intact.  ?   Conjunctiva/sclera: Conjunctivae normal.  ? ?Neurological:  ?   Mental Status: He is alert.  ?  ? ?Assessment/Plan ?PDR/VH OD: PPV/EC/EL/Avastin  OD ? ?Donnel Saxon, MD ?06/11/2021, 7:24 AM ? ? ? ?

## 2021-06-12 ENCOUNTER — Encounter (HOSPITAL_COMMUNITY): Payer: Self-pay | Admitting: Ophthalmology

## 2021-07-01 NOTE — Progress Notes (Signed)
(  4) traction retinal detachment involving the macula ?

## 2021-07-30 ENCOUNTER — Emergency Department (HOSPITAL_COMMUNITY)
Admission: EM | Admit: 2021-07-30 | Discharge: 2021-07-30 | Disposition: A | Discharge status: Home / Self Care | Payer: Self-pay | Attending: Emergency Medicine | Admitting: Emergency Medicine

## 2021-07-30 ENCOUNTER — Other Ambulatory Visit: Payer: Self-pay

## 2021-07-30 ENCOUNTER — Encounter (HOSPITAL_COMMUNITY): Payer: Self-pay | Admitting: Emergency Medicine

## 2021-07-30 ENCOUNTER — Emergency Department (HOSPITAL_COMMUNITY): Payer: Self-pay

## 2021-07-30 DIAGNOSIS — L089 Local infection of the skin and subcutaneous tissue, unspecified: Secondary | ICD-10-CM | POA: Insufficient documentation

## 2021-07-30 DIAGNOSIS — E119 Type 2 diabetes mellitus without complications: Secondary | ICD-10-CM | POA: Insufficient documentation

## 2021-07-30 DIAGNOSIS — M79644 Pain in right finger(s): Secondary | ICD-10-CM | POA: Insufficient documentation

## 2021-07-30 DIAGNOSIS — Z794 Long term (current) use of insulin: Secondary | ICD-10-CM | POA: Insufficient documentation

## 2021-07-30 MED ORDER — CEPHALEXIN 500 MG PO CAPS: 500.0000 mg | ORAL_CAPSULE | Freq: Four times a day (QID) | ORAL | 0 refills | Status: AC

## 2021-07-30 MED ORDER — LIDOCAINE HCL (PF) 1 % IJ SOLN
5.0000 mL | Freq: Once | INTRAMUSCULAR | Status: AC
  Administered 2021-07-30: 5 mL
  Filled 2021-07-30: qty 30

## 2021-07-30 MED ORDER — CEPHALEXIN 500 MG PO CAPS
500.0000 mg | ORAL_CAPSULE | Freq: Once | ORAL | Status: AC
  Administered 2021-07-30: 500 mg via ORAL
  Filled 2021-07-30: qty 1

## 2021-07-30 MED ORDER — BUPIVACAINE HCL (PF) 0.5 % IJ SOLN
10.0000 mL | Freq: Once | INTRAMUSCULAR | Status: AC
  Administered 2021-07-30: 10 mL
  Filled 2021-07-30: qty 30

## 2021-07-30 NOTE — Discharge Instructions (Addendum)
I prescribed you an antibiotic called Keflex.  Please take this 4 times per day for the next 7 days.  Please only take this as prescribed.  Do not stop taking this antibiotic early even if you feel that your symptoms have resolved. ? ?Please continue to apply warm compresses to the finger. ? ?If you develop any new or worsening symptoms please come back to the emergency department. ?

## 2021-07-30 NOTE — ED Provider Notes (Signed)
?[facility]-EMERGENCY DEPT ?Provider Note ? ? ?CSN: [id number] ?Arrival date & time: 07/30/21  1531 ? ?  ? ?History ? ?Chief Complaint  ?Patient presents with  ? Finger Injury  ? ? ?Raymond [name] is a 36 y.o. male. ? ?HPI ?Patient is a 36 year old male with a history of insulin-dependent diabetes mellitus, amphetamine abuse, who presents to the emergency department due to pain and swelling to the distal end of the right index finger.  Symptoms started about 2 days ago and have been worsening.  He states that he attempted to perform an I&D at home last night but was unable to produce any purulent drainage from the end of the finger.  He woke up this morning with worsening pain and swelling.  No fevers, chills, nausea, vomiting, numbness.  States that he has been compliant with his insulin for diabetes mellitus.   ?  ? ?Home Medications ?Prior to Admission medications   ?Medication Sig Start Date End Date Taking? Authorizing Provider  ?cephALEXin (KEFLEX) 500 MG capsule Take 1 capsule (500 mg total) by mouth 4 (four) times daily for 7 days. 07/30/21 08/06/21 Yes Placido Sou, PA-C  ?atorvastatin (LIPITOR) 20 MG tablet Take 20 mg by mouth at bedtime. 04/21/21   [provider]  ?lisinopril (ZESTRIL) 10 MG tablet Take 1 tablet (10 mg total) by mouth daily. 02/04/21 03/10/22  Linwood Dibbles, MD  ?Multiple Vitamins-Minerals (ADVANCED DIABETIC MULTIVITAMIN PO) Take 1 tablet by mouth daily.    [provider]  ?NAPROSYN 500 MG tablet Take 500 mg by mouth 2 (two) times daily as needed for moderate pain, headache or mild pain. 04/21/21   [provider]  ?NOVOLIN 70/30 RELION (70-30) 100 UNIT/ML injection Inject 20-40 Units into the skin See admin instructions. Inject 40 units into the skin in the morning, 20 units at noon, 30 units at bedtime ?Patient taking differently: Inject 20-40 Units into the skin See admin instructions. Takes 50 units in the AM and 50 units at bedtime 03/17/21    Canary Brim L, NP  ?ofloxacin (OCUFLOX) 0.3 % ophthalmic solution Place 1 drop into the right eye 4 (four) times daily. 05/08/21   [provider]  ?prednisoLONE acetate (PRED FORTE) 1 % ophthalmic suspension Place 1 drop into the right eye 4 (four) times daily. ?Patient not taking: Reported on 05/14/2021 05/08/21   [provider]  ?   ? ?Allergies    ?Ibuprofen and Tramadol   ? ?Review of Systems   ?Review of Systems  ?Constitutional:  Negative for chills and fever.  ?Gastrointestinal:  Negative for nausea and vomiting.  ?Musculoskeletal:  Positive for arthralgias, joint swelling and myalgias.  ?Skin:  Positive for color change and wound.  ?Neurological:  Negative for weakness and numbness.  ? ?Physical Exam ?Updated Vital Signs ?BP (!) 169/106 (BP Location: Right Arm)   Pulse 98   Temp 98.2 ?F (36.8 ?C) (Oral)   Resp 19   SpO2 100%  ?Physical Exam ?Vitals and nursing note reviewed.  ?Constitutional:   ?   General: He is not in acute distress. ?   Appearance: He is well-developed.  ?HENT:  ?   Head: Normocephalic and atraumatic.  ?   Right Ear: External ear normal.  ?   Left Ear: External ear normal.  ?Eyes:  ?   General: No scleral icterus.    ?   Right eye: No discharge.     ?   Left eye: No discharge.  ?  Conjunctiva/sclera: Conjunctivae normal.  ?Neck:  ?   Trachea: No tracheal deviation.  ?Cardiovascular:  ?   Rate and Rhythm: Normal rate.  ?Pulmonary:  ?   Effort: Pulmonary effort is normal. No respiratory distress.  ?   Breath sounds: No stridor.  ?Abdominal:  ?   General: There is no distension.  ?Musculoskeletal:     ?   General: Tenderness present. No swelling or deformity.  ?   Cervical back: Neck supple.  ?   Comments: Right index finger: moderate TTP noted along the padding and distal end of the finger with mild swelling.  Full range of motion of the affected finger.  Pain appears to improve with finger fully extended.  Distal sensation intact.  Good cap refill.  2+ radial  pulse.  ?Skin: ?   General: Skin is warm and dry.  ?   Findings: Erythema present. No rash.  ?Neurological:  ?   Mental Status: He is alert.  ?   Cranial Nerves: Cranial nerve deficit: no gross deficits.  ? ?ED Results / Procedures / Treatments   ?Labs ?(all labs ordered are listed, but only abnormal results are displayed) ?Labs Reviewed - No data to display ? ?EKG ?None ? ?Radiology ?DG Finger Index Right ? ?Result Date: 07/30/2021 ?CLINICAL DATA:  Distal right finger infection, possible foreign body EXAM: RIGHT INDEX FINGER 2+V COMPARISON:  None Available. FINDINGS: No fracture or dislocation is seen. The joint spaces are preserved. The visualized soft tissues are unremarkable. No radiopaque foreign body is seen. IMPRESSION: Negative. Electronically Signed   By: Charline Bills M.D.   On: 07/30/2021 16:48   ? ?Procedures ?Procedures  ? ?Medications Ordered in ED ?Medications  ?cephALEXin (KEFLEX) capsule 500 mg (has no administration in time range)  ?bupivacaine(PF) (MARCAINE) 0.5 % injection 10 mL (10 mLs Infiltration Given by Other 07/30/21 1646)  ?lidocaine (PF) (XYLOCAINE) 1 % injection 5 mL (5 mLs Infiltration Given by Other 07/30/21 1647)  ? ?ED Course/ Medical Decision Making/ A&P ?  ?                        ?Medical Decision Making ?Amount and/or Complexity of Data Reviewed ?Radiology: ordered. ? ?Risk ?Prescription drug management. ? ? ?Patient is a 36 year old male with a history of insulin-dependent diabetes mellitus who presents to the emergency department with pain and swelling to the distal end of the right index finger.  Started about 2 days ago and has been progressively worsening.  Denies any systemic symptoms such as fevers, chills, nausea, or vomiting. ? ?On my exam patient has mild swelling and moderate tenderness to the distal end of the right index finger.  Does not appear to track along the palmar aspect of the distal phalanx.  No involvement of the lateral nail folds.  Full range of motion of  the finger and pain improves with finger extension.  Neurovascularly intact.  Appears consistent with possible developing felon versus paronychia versus flexor tenosynovitis.  Appears most consistent with developing felon. ? ?I obtained an x-ray of the right index finger which is negative.  Finger cleaned extensively and needle aspiration was performed along the ulnar aspect of the index finger.  No purulent material was noted.  Mild bleeding.  Patient tolerated the procedure well.  Did not move forward with I&D. ? ?We will discharge patient on a course of Keflex.  He states that he is not insured and will not follow-up with hand surgery so he  was given very strict return precautions.  Patient understands to return to the emergency department with any new or worsening symptoms. ? ?Patient appears stable for discharge at this time and he is agreeable.  His questions were answered and he was amicable at the time of discharge. ?Final Clinical Impression(s) / ED Diagnoses ?Final diagnoses:  ?Finger infection  ? ?Rx / DC Orders ?ED Discharge Orders   ? ?      Ordered  ?  cephALEXin (KEFLEX) 500 MG capsule  4 times daily       ? 07/30/21 1719  ? ?  ?  ? ?  ? ? ?  ?Placido SouJoldersma, Pamelia Botto, PA-C ?07/30/21 1729 ? ?  ?Charlynne PanderYao, David Hsienta, MD ?07/30/21 2307 ? ?

## 2021-07-30 NOTE — ED Notes (Signed)
Medications left at bedside for provider to use  ?

## 2021-07-30 NOTE — ED Triage Notes (Addendum)
Pt arrives w/ c/o finger pain to right pointer finger. Cut on finger. Pain 6/10. Hx diabetes  ?

## 2022-01-16 ENCOUNTER — Other Ambulatory Visit: Payer: Self-pay

## 2022-01-16 ENCOUNTER — Emergency Department (HOSPITAL_COMMUNITY)
Admission: EM | Admit: 2022-01-16 | Discharge: 2022-01-16 | Discharge status: Against Medical Advice | Payer: Self-pay | Attending: Emergency Medicine | Admitting: Emergency Medicine

## 2022-01-16 DIAGNOSIS — Z4800 Encounter for change or removal of nonsurgical wound dressing: Secondary | ICD-10-CM | POA: Insufficient documentation

## 2022-01-16 DIAGNOSIS — E119 Type 2 diabetes mellitus without complications: Secondary | ICD-10-CM | POA: Insufficient documentation

## 2022-01-16 DIAGNOSIS — Z760 Encounter for issue of repeat prescription: Secondary | ICD-10-CM | POA: Insufficient documentation

## 2022-01-16 DIAGNOSIS — Z794 Long term (current) use of insulin: Secondary | ICD-10-CM | POA: Insufficient documentation

## 2022-01-16 DIAGNOSIS — Z5321 Procedure and treatment not carried out due to patient leaving prior to being seen by health care provider: Secondary | ICD-10-CM | POA: Insufficient documentation

## 2022-01-16 DIAGNOSIS — I10 Essential (primary) hypertension: Secondary | ICD-10-CM | POA: Insufficient documentation

## 2022-01-16 MED ORDER — TETANUS-DIPHTH-ACELL PERTUSSIS 5-2.5-18.5 LF-MCG/0.5 IM SUSY: 0.5000 mL | PREFILLED_SYRINGE | Freq: Once | INTRAMUSCULAR | Status: DC

## 2022-01-16 NOTE — ED Notes (Signed)
Pt stated that he will come later he needs to eat. Pt then walked out of the door. I will wait to see if he comes back in within an hour or so.

## 2022-01-16 NOTE — ED Triage Notes (Signed)
Patient w/ wound to the central chest for 2 days. Visualized it and can see redness w/ some pus to the area. Patient w/ DM, takes insulin has had a recurrence of these over the last 2 weeks since hes traveled to [place]. Patient w/ hx hypertension and hypertensive in triage. Per patient he ran out of his meds.

## 2022-01-16 NOTE — ED Provider Triage Note (Signed)
Emergency Medicine Provider Triage Evaluation Note  Raymond [name] , a 36 y.o. male  was evaluated in triage.  Pt complains of wound.  Patient noticed a blemish on his chest wall approximately 2 days ago.  He scratched the area and there was subsequent swelling.  Has noticed no drainage from site.  Denies fever, chills, night sweats.  He secondarily endorses being out of blood pressure medicine for the past 2 weeks and has not establish care with a primary care provider to refill his medication.  He is on lisinopril..  Last tetanus greater than 10 years.  Review of Systems  Positive: See above Negative:   Physical Exam  BP (!) 193/105 (BP Location: Right Arm)   Pulse 90   Temp 98.6 F (37 C) (Oral)   Resp 16   SpO2 100%  Gen:   Awake, no distress   Resp:  Normal effort  MSK:   Moves extremities without difficulty  Other:  1 to 1.5 cm in diameter area of induration noted on patient's left chest wall.  No obvious drainage or palpable fluctuance noted.  Minimal extending erythema.  Medical Decision Making  Medically screening exam initiated at 1:18 PM.  Appropriate orders placed.  Raymond [name] was informed that the remainder of the evaluation will be completed by another provider, this initial triage assessment does not replace that evaluation, and the importance of remaining in the ED until their evaluation is complete.     Wilnette Kales, [place] 01/16/22 1319

## 2022-01-16 NOTE — ED Notes (Signed)
Pt did not return to ed.

## 2022-01-19 ENCOUNTER — Encounter (HOSPITAL_COMMUNITY): Payer: Self-pay

## 2022-01-19 ENCOUNTER — Other Ambulatory Visit: Payer: Self-pay

## 2022-01-19 ENCOUNTER — Emergency Department (HOSPITAL_COMMUNITY): Admission: EM | Admit: 2022-01-19 | Discharge: 2022-01-19 | Discharge status: Against Medical Advice | Payer: Self-pay

## 2022-01-19 ENCOUNTER — Emergency Department (HOSPITAL_COMMUNITY)
Admission: EM | Admit: 2022-01-19 | Discharge: 2022-01-19 | Disposition: A | Discharge status: Home / Self Care | Payer: Self-pay | Attending: Emergency Medicine | Admitting: Emergency Medicine

## 2022-01-19 DIAGNOSIS — Z79899 Other long term (current) drug therapy: Secondary | ICD-10-CM | POA: Insufficient documentation

## 2022-01-19 DIAGNOSIS — I10 Essential (primary) hypertension: Secondary | ICD-10-CM | POA: Insufficient documentation

## 2022-01-19 DIAGNOSIS — R000 Tachycardia, unspecified: Secondary | ICD-10-CM | POA: Insufficient documentation

## 2022-01-19 DIAGNOSIS — L02213 Cutaneous abscess of chest wall: Secondary | ICD-10-CM | POA: Insufficient documentation

## 2022-01-19 DIAGNOSIS — E119 Type 2 diabetes mellitus without complications: Secondary | ICD-10-CM | POA: Insufficient documentation

## 2022-01-19 MED ORDER — LISINOPRIL 40 MG PO TABS: 40.0000 mg | ORAL_TABLET | Freq: Every day | ORAL | 2 refills | Status: DC

## 2022-01-19 MED ORDER — DOXYCYCLINE HYCLATE 100 MG PO CAPS: 100.0000 mg | ORAL_CAPSULE | Freq: Two times a day (BID) | ORAL | 0 refills | Status: DC

## 2022-01-19 MED ORDER — LIDOCAINE HCL (PF) 1 % IJ SOLN
10.0000 mL | Freq: Once | INTRAMUSCULAR | Status: AC
  Administered 2022-01-19: 10 mL
  Filled 2022-01-19: qty 30

## 2022-01-19 NOTE — Discharge Instructions (Addendum)
Please take the entire course of antibiotics that I prescribed, be cautious as they can be a little bit rough on the stomach, I recommend that you take them with a full stomach, as well as they can make you sun sensitive, please wear sunscreen while you are taking these antibiotics.  I have refilled your blood pressure medication as well, please follow-up to establish care with a primary care provider before you run out of your blood pressure refill, please return the emergency department if you are having uncontrolled blood pressure despite taking her medication, chest pain, shortness of breath, vision changes, weakness, numbness.  Please follow-up if your wound on the chest does not improve with antibiotics for further evaluation and management.

## 2022-01-19 NOTE — ED Triage Notes (Signed)
Patient has an abscess to the mid chest x 5 days.Raymond [name]

## 2022-01-19 NOTE — ED Provider Triage Note (Signed)
Emergency Medicine Provider Triage Evaluation Note  Raymond [name] , a 36 y.o. male  was evaluated in triage.  Pt complains of concern for infected wound on left chest.  Patient endorses history of infected wounds, MRSA, he denies any systemic fever, chills, his blood pressure is elevated on arrival, reports that he does have a history of high blood pressure but he just returned from [place], and he has not been able to reestablish with a primary care doctor at this time.  He denies any chest pain, shortness of breath, fever, chills, vision changes, weakness, numbness.  Review of Systems  Positive: Chest wound, drainage Negative: Fever, chills  Physical Exam  BP (!) 205/119 (BP Location: Left Arm) Comment: Pt states being out of BP meds  Pulse (!) 101   Temp 98.7 F (37.1 C) (Oral)   Resp 18   SpO2 98%  Gen:   Awake, no distress   Resp:  Normal effort  MSK:   Moves extremities without difficulty  Other:  Fluctuant, somewhat draining wound on left pectoralis, hard, indurated, with some fluctuance  Medical Decision Making  Medically screening exam initiated at 6:22 PM.  Appropriate orders placed.  Raymond [name] was informed that the remainder of the evaluation will be completed by another provider, this initial triage assessment does not replace that evaluation, and the importance of remaining in the ED until their evaluation is finished   Raymond [name], [place] 01/19/22 1824

## 2022-01-19 NOTE — ED Provider Notes (Signed)
[facility]-EMERGENCY DEPT Provider Note   CSN: [id number] Arrival date & time: 01/19/22  1724     History  Chief Complaint  Patient presents with   Abscess    Raymond [name] is a 36 y.o. male with past medical history significant for episodic substance abuse, diabetes, amphetamine abuse, IV drug use who presents with concern for infected wound on left chest.  Patient reports that started as a blemish a few days ago and then he began picking at it.  Patient endorses history of infected wounds, MRSA. He denies any systemic fever, chills. His blood pressure is elevated on arrival, reports that he does have a history of high blood pressure but he just returned from [place], and he has not been able to reestablish with a primary care doctor at this time.  He denies any chest pain, shortness of breath, fever, chills, vision changes, weakness, numbness.  Patient reports that he supposed be taking lisinopril 40 mg but he is reestablishing care with his primary care doctor after season in [place], and has not been able to refill it yet.   Abscess      Home Medications Prior to Admission medications   Medication Sig Start Date End Date Taking? Authorizing Provider  doxycycline (VIBRAMYCIN) 100 MG capsule Take 1 capsule (100 mg total) by mouth 2 (two) times daily. 01/19/22  Yes Alexande Sheerin H, PA-C  lisinopril (ZESTRIL) 40 MG tablet Take 1 tablet (40 mg total) by mouth daily. 01/19/22  Yes Osten Janek H, PA-C  atorvastatin (LIPITOR) 20 MG tablet Take 20 mg by mouth at bedtime. 04/21/21   [provider]  Multiple Vitamins-Minerals (ADVANCED DIABETIC MULTIVITAMIN PO) Take 1 tablet by mouth daily.    [provider]  NAPROSYN 500 MG tablet Take 500 mg by mouth 2 (two) times daily as needed for moderate pain, headache or mild pain. 04/21/21   [provider]  NOVOLIN 70/30 RELION (70-30) 100 UNIT/ML injection Inject 20-40 Units into the  skin See admin instructions. Inject 40 units into the skin in the morning, 20 units at noon, 30 units at bedtime Patient taking differently: Inject 20-40 Units into the skin See admin instructions. Takes 50 units in the AM and 50 units at bedtime 03/17/21   Canary Brim L, NP  ofloxacin (OCUFLOX) 0.3 % ophthalmic solution Place 1 drop into the right eye 4 (four) times daily. 05/08/21   [provider]  prednisoLONE acetate (PRED FORTE) 1 % ophthalmic suspension Place 1 drop into the right eye 4 (four) times daily. Patient not taking: Reported on 05/14/2021 05/08/21   [provider]      Allergies    Ibuprofen and Tramadol    Review of Systems   Review of Systems  Skin:  Positive for wound.  All other systems reviewed and are negative.   Physical Exam Updated Vital Signs BP (!) 188/117 (BP Location: Left Arm)   Pulse (!) 101   Temp 98.7 F (37.1 C) (Oral)   Resp 18   Ht 5\' 10"  (1.778 m)   Wt 86.2 kg   SpO2 98%   BMI 27.26 kg/m  Physical Exam Vitals and nursing note reviewed.  Constitutional:      General: He is not in acute distress.    Appearance: Normal appearance.  HENT:     Head: Normocephalic and atraumatic.  Eyes:     General:        Right eye: No discharge.  Left eye: No discharge.  Cardiovascular:     Rate and Rhythm: Regular rhythm. Tachycardia present.     Heart sounds: No murmur heard.    No friction rub. No gallop.  Pulmonary:     Effort: Pulmonary effort is normal.     Breath sounds: Normal breath sounds.  Abdominal:     General: Bowel sounds are normal.     Palpations: Abdomen is soft.  Skin:    General: Skin is warm and dry.     Capillary Refill: Capillary refill takes less than 2 seconds.     Comments: With approximately 3 cm fluctuant, red, tender mass on the left pectoralis with some surrounding cellulitis.  Neurological:     Mental Status: He is alert and oriented to person, place, and time.  Psychiatric:        Mood and  Affect: Mood normal.        Behavior: Behavior normal.     ED Results / Procedures / Treatments   Labs (all labs ordered are listed, but only abnormal results are displayed) Labs Reviewed - No data to display  EKG None  Radiology No results found.  Procedures .Marland KitchenIncision and Drainage  Date/Time: 01/19/2022 9:26 PM  Performed by: Olene Floss, PA-C Authorized by: Olene Floss, PA-C   Consent:    Consent obtained:  Verbal   Consent given by:  Patient   Risks, benefits, and alternatives were discussed: yes     Risks discussed:  Incomplete drainage, bleeding, infection, pain and damage to other organs   Alternatives discussed:  No treatment Universal protocol:    Procedure explained and questions answered to patient or proxy's satisfaction: yes     Patient identity confirmed:  Verbally with patient Location:    Type:  Abscess   Size:  3.5cm   Location:  Trunk   Trunk location:  Chest Pre-procedure details:    Skin preparation:  Povidone-iodine Sedation:    Sedation type:  None Anesthesia:    Anesthesia method:  Local infiltration   Local anesthetic:  Lidocaine 1% w/o epi Procedure type:    Complexity:  Simple Procedure details:    Incision types:  Single straight   Incision depth:  Dermal   Wound management:  Probed and deloculated   Drainage:  Bloody   Drainage amount:  Scant   Wound treatment:  Wound left open   Packing materials:  None Post-procedure details:    Procedure completion:  Tolerated     Medications Ordered in ED Medications  lidocaine (PF) (XYLOCAINE) 1 % injection 10 mL (10 mLs Infiltration Given by Other 01/19/22 2045)    ED Course/ Medical Decision Making/ A&P                           Medical Decision Making Risk Prescription drug management.   This is an overall well appearing patient with past medical history significant for previous skin infections, amphetamine use without IV drug use who presents with a wound  to the left chest wall, he also has some uncontrolled blood pressure on arrival but denies any chest pain, shortness of breath, vision changes, weakness, numbness, or other secondary symptoms.  Patient reports he has a known history of high blood pressure, but that it is not currently controlled because he has not been to refill his blood pressure medication.  He reports he takes lisinopril 40 mg.  On evaluation of the chest patient does have a  small abscess with surrounding cellulitis on the left pectoralis.  It is fluctuant to the touch.  I drained it above without significant complication, patient with some relief of pain after drainage.  We will refill his lisinopril.  Independently interpreted EKG which shows normal sinus rhythm, he is having some intermittent tachycardia, likely secondary to the pain in his abscess.  Blood pressure is slightly improved on recheck with BP 188/117.  As he is not having any symptoms at this time I think it is reasonable to refrain from any additional lab work, but do encourage him to follow-up with his PCP urgently for further evaluation and we will refill his lisinopril for uncontrolled hypertension. Final Clinical Impression(s) / ED Diagnoses Final diagnoses:  Abscess of chest wall  Uncontrolled hypertension    Rx / DC Orders ED Discharge Orders          Ordered    doxycycline (VIBRAMYCIN) 100 MG capsule  2 times daily        01/19/22 2119    lisinopril (ZESTRIL) 40 MG tablet  Daily        01/19/22 2119              Dorien Chihuahua 01/19/22 2128    Drenda Freeze, MD 01/19/22 [phone]

## 2022-02-07 IMAGING — CT CT ORBITS W/O CM
3 of 6 series · 13 of 47 positions shown, 15 images · non-contrast
Comparison: Maxillofacial CT 10/23/2020.

CLINICAL DATA: Awoke today with blurriness in the right eye.

EXAM:
CT ORBITS WITHOUT CONTRAST
TECHNIQUE: Multidetector CT imaging of the orbits was performed using the
standard protocol without intravenous contrast. Multiplanar CT image
reconstructions were also generated.
RADIATION DOSE REDUCTION: This exam was performed according to the
departmental dose-optimization program which includes automated
exposure control, adjustment of the mA and/or kV according to
patient size and/or use of iterative reconstruction technique.

[Series 3: orbits 2.0 hr40 3 st · axial · 0.32mm/px · z∈[-22,+56]mm · 8 of 47 slices shown, 10 images]
[im 4/47  brain]
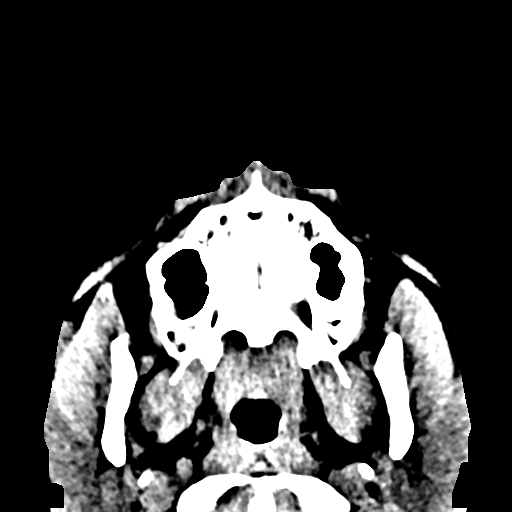
[im 4/47  bone]
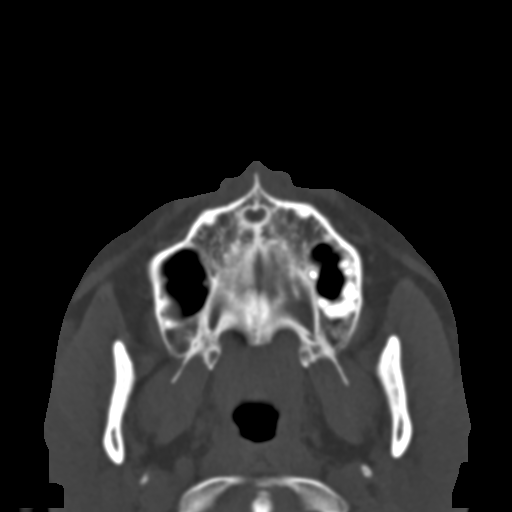
[im 10/47  bone]
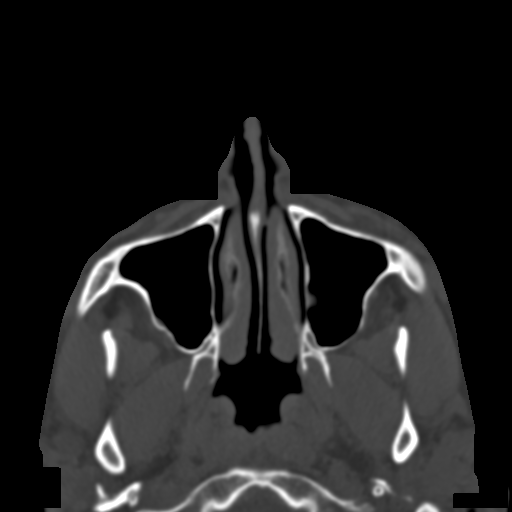
[im 17/47  bone]
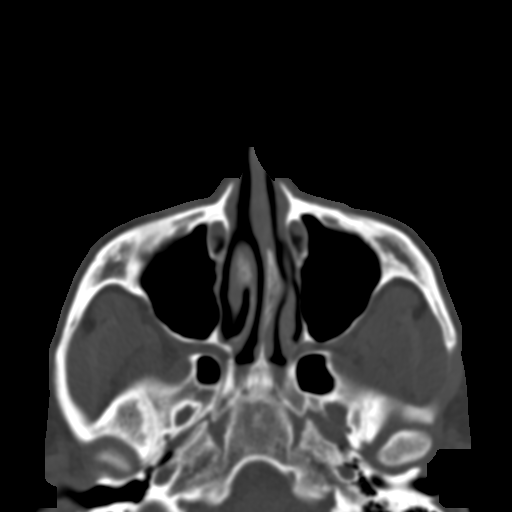
[im 20/47  bone]
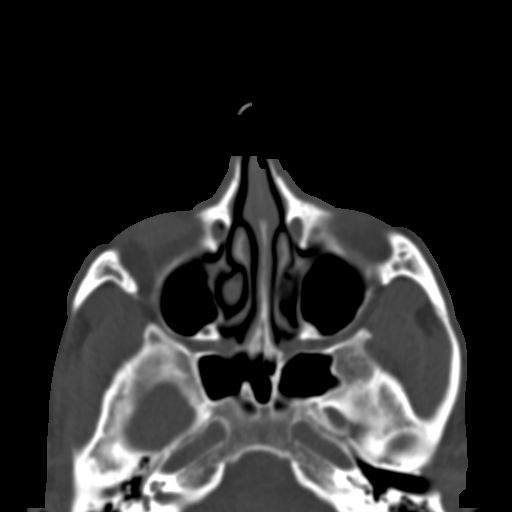
[im 27/47  brain]
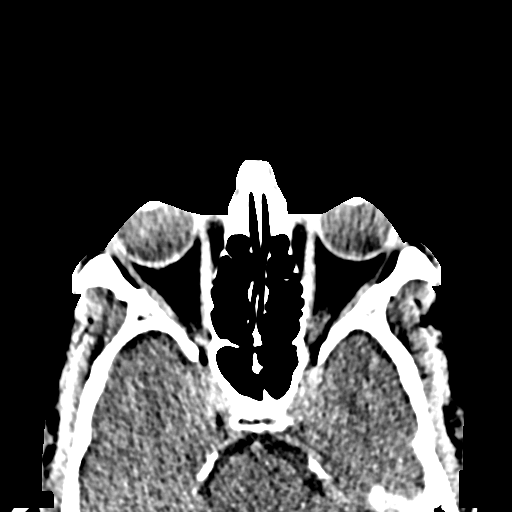
[im 27/47  bone]
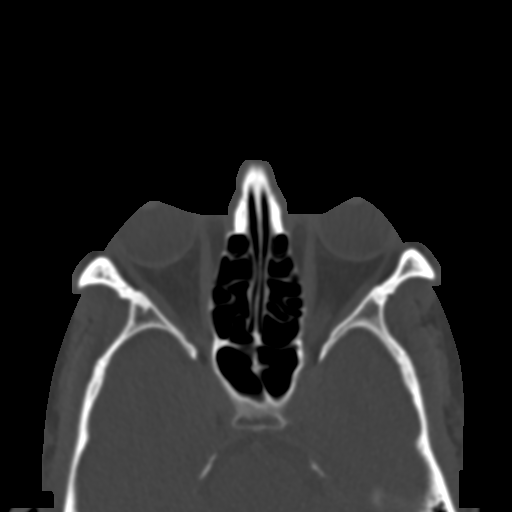
[im 30/47  bone]
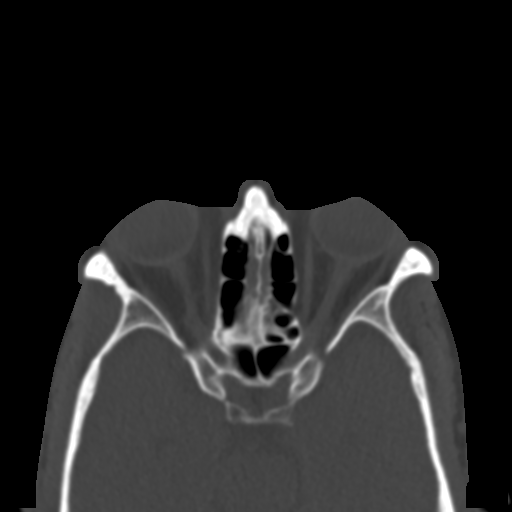
[im 37/47  bone]
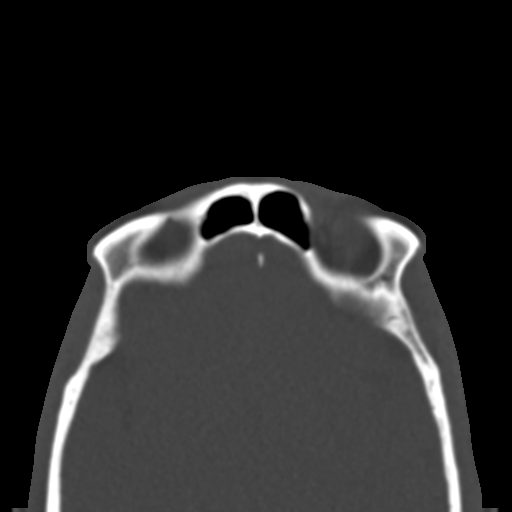
[im 43/47  bone]
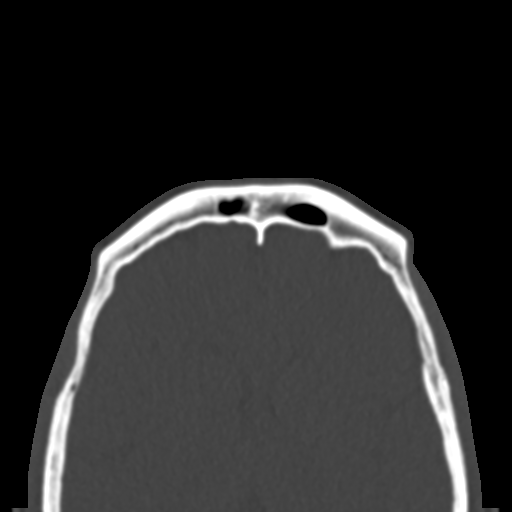

[Series 5: st cor · coronal · 0.20mm/px · 3 of 74 slices shown]
[im 19/74  bone]
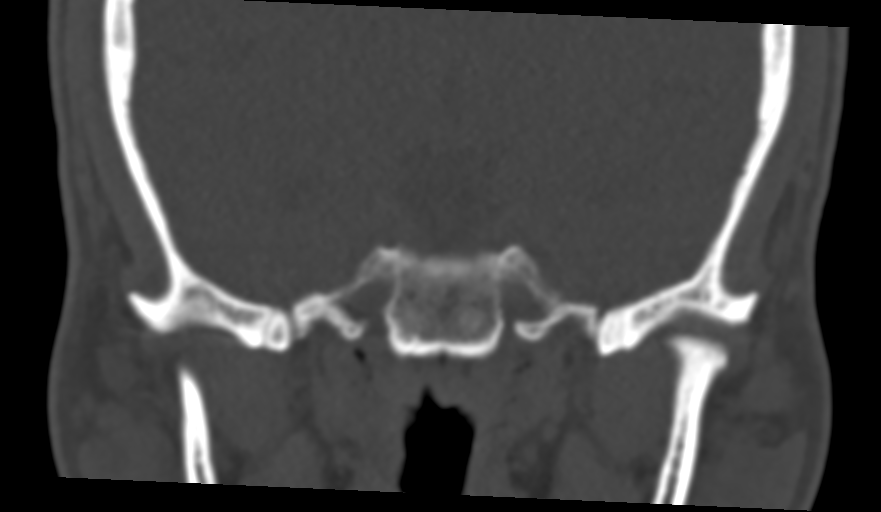
[im 37/74  bone]
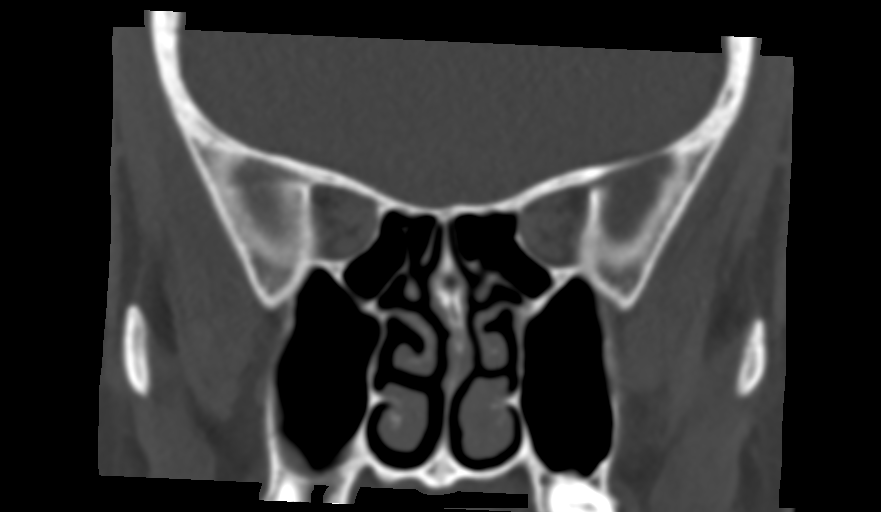
[im 55/74  bone]
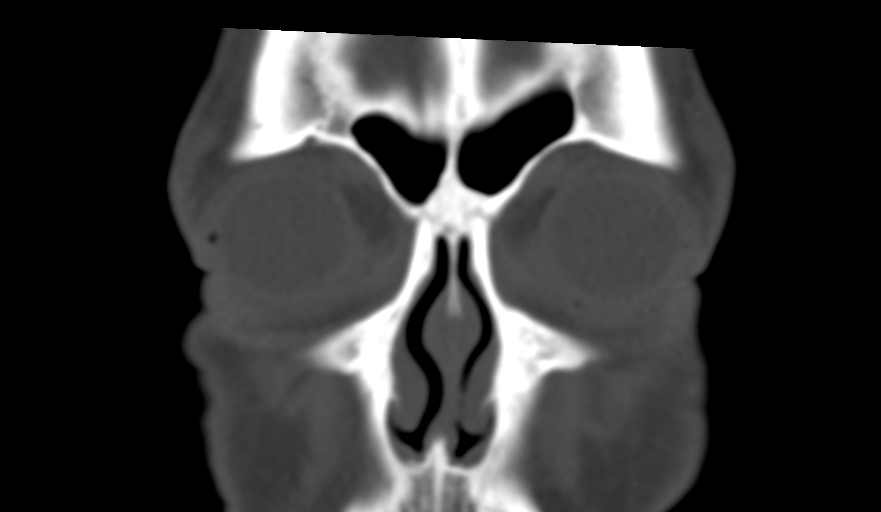

[Series 8: bone sag · sagittal · 0.20mm/px · 2 of 78 slices shown]
[im 26/78  bone]
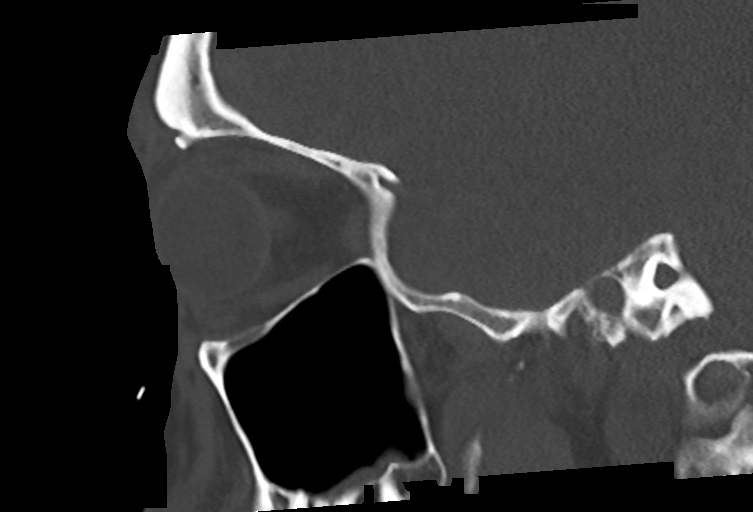
[im 52/78  bone]
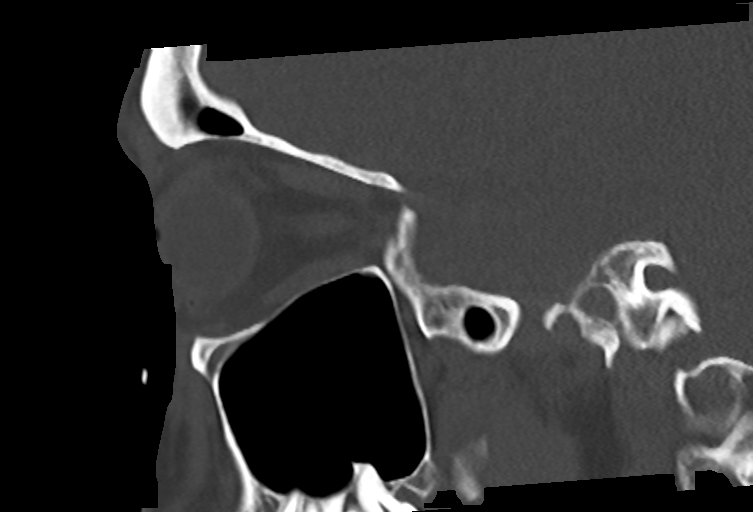

[13 of 47 positions shown; findings below may reference images not displayed]

FINDINGS: Orbits: Both globes appear normal. Both lenses appear normal. Optic
nerves are symmetric and normal. Orbital fat is normal. Extra-ocular
muscles are normal. Orbital apices are normal.

Visible paranasal sinuses: Clear. Few insignificant tiny retention
cysts.

Soft tissues: Other regional soft tissues of the face appear normal.

Osseous: Normal.

Limited intracranial: Normal.
IMPRESSION: Normal CT scan of the orbits. No abnormality seen to explain blurred
vision of the right eye.

## 2022-12-28 DIAGNOSIS — Z139 Encounter for screening, unspecified: Secondary | ICD-10-CM

## 2022-12-28 LAB — GLUCOSE, POCT (MANUAL RESULT ENTRY): POC Glucose: 398 mg/dL — AB (ref 70–99)

## 2022-12-28 NOTE — Congregational Nurse Program (Signed)
  Dept: [phone]   Congregational Nurse Program Note  Date of Encounter: 12/28/2022  Past Medical History: Past Medical History:  Diagnosis Date   Borderline personality disorder (HCC)    COVID    has had it twice, last time was in 2022, mild case 2nd time, worse on the first time   Diabetes mellitus without complication (HCC)    Type 1   Gallstones    Headache    Hypertension    Substance abuse (HCC)    drug and alcohol abuse since age 37    Encounter Details:  CNP Questionnaire - 12/28/22 1609       Questionnaire   Ask client: Do you give verbal consent for me to treat you today? Yes    Student Assistance [employer] Nurse    Location Patient Presenter, broadcasting    Visit Setting with Client Organization    Patient Status Unknown    Insurance Uninsured ([employer]s/Self-Pay/Medicaid Family Planning)    Insurance/Financial Assistance Referral N/A    Medication Have Medication Insecurities    Medical Provider Yes    Screening Referrals Made Annual Wellness Visit    Medical Referrals Made [facility] P[facility]    Medical Appointment Made Other    Recently w/o PCP, now 1st time PCP visit completed due to CNs referral or appointment made N/A    Food N/A    Transportation N/A    Housing/Utilities N/A    Interpersonal Safety N/A    Interventions Navigate Healthcare System    Abnormal to Normal Screening Since Last CN Visit N/A    Screenings CN Performed Blood Pressure;Blood Glucose    Sent Client to Lab for: N/A    Did client attend any of the following based off CNs referral or appointments made? N/A    ED Visit Averted Yes    Life-Saving Intervention Made Yes            Client here for screenings.      12/28/2022    4:08 PM 01/19/2022    6:28 PM 01/19/2022    5:35 PM  Vitals with BMI  Height  5\' 10"    Weight 191 lbs 190 lbs   BMI  27.26   Systolic 139 188 272  Diastolic 86 117 119  Pulse 76  101

## 2023-01-11 DIAGNOSIS — F1920 Other psychoactive substance dependence, uncomplicated: Secondary | ICD-10-CM | POA: Diagnosis not present

## 2023-01-18 DIAGNOSIS — F1920 Other psychoactive substance dependence, uncomplicated: Secondary | ICD-10-CM | POA: Diagnosis not present

## 2023-01-20 DIAGNOSIS — F1920 Other psychoactive substance dependence, uncomplicated: Secondary | ICD-10-CM | POA: Diagnosis not present

## 2023-01-25 DIAGNOSIS — F1920 Other psychoactive substance dependence, uncomplicated: Secondary | ICD-10-CM | POA: Diagnosis not present

## 2023-02-10 DIAGNOSIS — F1920 Other psychoactive substance dependence, uncomplicated: Secondary | ICD-10-CM | POA: Diagnosis not present

## 2023-03-08 DIAGNOSIS — F1920 Other psychoactive substance dependence, uncomplicated: Secondary | ICD-10-CM | POA: Diagnosis not present

## 2023-03-15 DIAGNOSIS — F1920 Other psychoactive substance dependence, uncomplicated: Secondary | ICD-10-CM | POA: Diagnosis not present

## 2023-03-16 ENCOUNTER — Ambulatory Visit: Payer: Medicaid Other | Admitting: Internal Medicine

## 2023-03-16 ENCOUNTER — Encounter: Payer: Self-pay | Admitting: Internal Medicine

## 2023-03-16 VITALS — BP 144/91 | HR 77 | Temp 97.8°F | Ht 70.0 in | Wt 203.3 lb

## 2023-03-16 DIAGNOSIS — F332 Major depressive disorder, recurrent severe without psychotic features: Secondary | ICD-10-CM | POA: Diagnosis not present

## 2023-03-16 DIAGNOSIS — E785 Hyperlipidemia, unspecified: Secondary | ICD-10-CM

## 2023-03-16 DIAGNOSIS — E1010 Type 1 diabetes mellitus with ketoacidosis without coma: Secondary | ICD-10-CM | POA: Diagnosis not present

## 2023-03-16 DIAGNOSIS — F603 Borderline personality disorder: Secondary | ICD-10-CM

## 2023-03-16 DIAGNOSIS — F1990 Other psychoactive substance use, unspecified, uncomplicated: Secondary | ICD-10-CM | POA: Diagnosis not present

## 2023-03-16 DIAGNOSIS — I10 Essential (primary) hypertension: Secondary | ICD-10-CM

## 2023-03-16 LAB — POCT GLYCOSYLATED HEMOGLOBIN (HGB A1C): Hemoglobin A1C: 10.1 % — AB (ref 4.0–5.6)

## 2023-03-16 LAB — GLUCOSE, CAPILLARY: Glucose-Capillary: 89 mg/dL (ref 70–99)

## 2023-03-16 MED ORDER — LISINOPRIL 40 MG PO TABS: 40.0000 mg | ORAL_TABLET | Freq: Every day | ORAL | 2 refills | Status: DC

## 2023-03-16 NOTE — Assessment & Plan Note (Signed)
BP today is 140/92, similar upon recheck. Patient takes lisinopril 40mg  though infrequently as he has not had regular provider.  - Refill lisinopril 40mg  - Check BMP today

## 2023-03-16 NOTE — Assessment & Plan Note (Signed)
History of T1DM with previous admission for DKA. A1c today is 10.1. He gets over the counter insulin 70/30, and takes usually 30 units in the morning and at night. He is infrequent about taking insulin, depending on how he is feeling that day and if he has the time.  - Increase to 40 units BID - Referral to diabetes coordinator

## 2023-03-16 NOTE — Patient Instructions (Signed)
Mr Book,   It was a pleasure meeting you today.   We will be checking your cholesterol levels and your blood electrolytes today. I will call you if there are any abnormalities.   For your blood pressure, I have refilled your Lisinopril 40mg .   For your diabetes, I recommend taking 40 units of the 70/30 in the morning and 40 units at night. I have also placed a referral to meet with our diabetes coordinator.   I have also placed a referral to meet with psychiatry.   To meet with a dentist, I recommend calling your insurance provider and finding out which dentists they cover in this area, then making an appointment with them.   We will plan to see you again in 3 months to recheck your A1c.   Thanks,  Dr Carlynn Purl

## 2023-03-16 NOTE — Assessment & Plan Note (Signed)
Patient has previously been prescribed atorvastatin, though is not currently taking. No recent lipid profile.  - Check lipid profile today

## 2023-03-16 NOTE — Assessment & Plan Note (Signed)
Patient reports history of borderline personality disorder, major depressive disorder with mania, previous suicide attempt. He has previously been taking Wellbutrin and Abilify though is not currently. He feels like his mental health is good currently. He meets with [facility] for group discussions.  - Psychiatry referral sent

## 2023-03-16 NOTE — Assessment & Plan Note (Signed)
Reports history of smoking and injecting meth and heroin, though none since 2022. He also reports using over the counter cough syrup to get high daily for approximately the past 14 years, though has quit in the past couple weeks. Currently does not use any drugs.

## 2023-03-16 NOTE — Progress Notes (Signed)
   CC: Establish care  HPI:  [name] is a 37 y.o. male living with a history stated below and presents today to establish care. Please see problem based assessment and plan for additional details.  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 Stays with girlfriend or mother while in [place] Alcohol: 4-5 drinks/week, less than while living in [place] Drugs: previously smoked and injected meth in 2022, previous heroin, longtime daily use of OTC cough medicine Tobacco: chewing tobacco, 1 can/week  Review of Systems: ROS negative except for what is noted on the assessment and plan.  Vitals:   03/16/23 1426 03/16/23 1525  BP: (!) 140/92 (!) 144/91  Pulse:  77  Temp: 97.8 F (36.6 C)   TempSrc: Oral   Weight: 203 lb 4.8 oz (92.2 kg)   Height: 5\' 10"  (1.778 m)     Physical Exam: Constitutional: well-appearing, in no acute distress HENT: normocephalic atraumatic, mucous membranes moist Eyes: conjunctiva non-erythematous Neck: supple Cardiovascular: regular rate and rhythm, no m/r/g Pulmonary/Chest: normal work of breathing on room air, lungs clear to auscultation bilaterally Abdominal: soft, non-tender, non-distended MSK: normal bulk and tone Neurological: alert & oriented x 3, 5/5 strength in bilateral upper and lower extremities Skin: warm and dry  Assessment & Plan:   Hypertension BP today is 140/92, similar upon recheck. Patient takes lisinopril 40mg  though infrequently as he has not had regular  provider.  - Refill lisinopril 40mg  - Check BMP today  Hyperlipidemia Patient has previously been prescribed atorvastatin, though is not currently taking. No recent lipid profile.  - Check lipid profile today  Borderline personality disorder (HCC) Patient reports history of borderline personality disorder, major depressive disorder with mania, previous suicide attempt. He has previously been taking Wellbutrin and Abilify though is not currently. He feels like his mental health is good currently. He meets with [facility] for group discussions.  - Psychiatry referral sent  Type 1 diabetes mellitus with ketoacidosis without coma (HCC) History of T1DM with previous admission for DKA. A1c today is 10.1. He gets over the counter insulin 70/30, and takes usually 30 units in the morning and at night. He is infrequent about taking insulin, depending on how he is feeling that day and if he has the time.  - Increase to 40 units BID - Referral to diabetes coordinator  Episodic substance abuse Reports history of smoking and injecting meth and heroin, though none since 2022. He also reports using over the counter cough syrup to get high daily for approximately the past 14 years, though has quit in the past couple weeks. Currently does not use any drugs.    Patient seen with Dr. Ernestene Mention, M.D. [facility] Internal Medicine, PGY-1 Pager: [phone] Date 03/16/2023 Time 3:51 PM

## 2023-03-17 LAB — BASIC METABOLIC PANEL
BUN/Creatinine Ratio: 19 (ref 9–20)
BUN: 24 mg/dL — ABNORMAL HIGH (ref 6–20)
CO2: 24 mmol/L (ref 20–29)
Calcium: 9.6 mg/dL (ref 8.7–10.2)
Chloride: 104 mmol/L (ref 96–106)
Creatinine, Ser: 1.26 mg/dL (ref 0.76–1.27)
Glucose: 83 mg/dL (ref 70–99)
Potassium: 5 mmol/L (ref 3.5–5.2)
Sodium: 141 mmol/L (ref 134–144)
eGFR: 75 mL/min/{1.73_m2} (ref >59)

## 2023-03-17 LAB — LIPID PANEL
Chol/HDL Ratio: 3.9 {ratio} (ref 0.0–5.0)
Cholesterol, Total: 311 mg/dL — ABNORMAL HIGH (ref 100–199)
HDL: 79 mg/dL (ref >39)
LDL Chol Calc (NIH): 217 mg/dL — ABNORMAL HIGH (ref 0–99)
Triglycerides: 92 mg/dL (ref 0–149)
VLDL Cholesterol Cal: 15 mg/dL (ref 5–40)

## 2023-03-21 NOTE — Progress Notes (Signed)
Internal Medicine Clinic Attending  I was physically present during the key portions of the resident provided service and participated in the medical decision making of patient's management care. I reviewed pertinent patient test results.  The assessment, diagnosis, and plan were formulated together and I agree with the documentation in the resident's note.  Gust Rung, DO

## 2023-04-04 ENCOUNTER — Other Ambulatory Visit: Payer: Self-pay | Admitting: Internal Medicine

## 2023-04-04 DIAGNOSIS — E1010 Type 1 diabetes mellitus with ketoacidosis without coma: Secondary | ICD-10-CM

## 2023-04-04 MED ORDER — ATORVASTATIN CALCIUM 20 MG PO TABS: 20.0000 mg | ORAL_TABLET | Freq: Every day | ORAL | 5 refills | Status: AC

## 2023-04-04 NOTE — Progress Notes (Signed)
 Called patient to discuss results of labwork from last visit including LDL 217 and creatinine 1.26. He is amenable to restarting his previous dose of atorvastatin  20mg . He will be in clinic tomorrow 04/05/23 to meet with the diabetes coordinator, so I ordered a repeat BMP and urine MCR for that time. Patient understands the plan.

## 2023-04-05 ENCOUNTER — Other Ambulatory Visit: Payer: Self-pay | Admitting: Dietician

## 2023-04-05 ENCOUNTER — Telehealth: Payer: Self-pay

## 2023-04-05 ENCOUNTER — Ambulatory Visit: Payer: Medicaid Other | Admitting: Dietician

## 2023-04-05 ENCOUNTER — Encounter: Payer: Self-pay | Admitting: Dietician

## 2023-04-05 VITALS — Wt 210.3 lb

## 2023-04-05 DIAGNOSIS — E1010 Type 1 diabetes mellitus with ketoacidosis without coma: Secondary | ICD-10-CM

## 2023-04-05 MED ORDER — DEXCOM G7 SENSOR MISC: 3 refills | Status: AC

## 2023-04-05 MED ORDER — "INSULIN SYRINGE-NEEDLE U-100 31G X 15/64"" 0.5 ML MISC": 3 refills | Status: DC

## 2023-04-05 NOTE — Telephone Encounter (Signed)
 Prior Authorization for patient (Dexcom G7 Sensor) came through on cover my meds was submitted with last office notes and labs awaiting approval or denial.   GUY:[id number]

## 2023-04-05 NOTE — Patient Instructions (Signed)
 Thank you for your visit today!  We talked about:   Having prescriptions for testing supplies (meter, strips and lancets and syringes) Continuous glucose monitoring  Goals to work on:   Learning how to use the data your new Continuous glucose monitoring is providing for you!  Please feel free to call me anytime.  [name] [phone]

## 2023-04-05 NOTE — Telephone Encounter (Signed)
Requested prescriptions.

## 2023-04-05 NOTE — Progress Notes (Signed)
 Diabetes Self-Management Education  Visit Type: First/Initial  Appt. Start Time: 115 PM Appt. End Time: 205 PM  04/05/2023  [name], identified by name and date of birth, is a 38 y.o. male with a diagnosis of Diabetes: Type 1.   ASSESSMENT  Weight 210 lb 4.8 oz (95.4 kg). Body mass index is 30.17 kg/m.  Says he has gained since being back from [place] .  220# April 2024, October 2024 181# 13 years on current insulin  was on lantus  for a while  Syringe 6 mm,  180 with 3 refills Sample Dexcom was provided to him today. He downloaded the app and started it after placing it on arm. Results are 174 to start to 129 1 hours ago. Does not check blood sugar , doe snot like finger sticks  Lab Results  Component Value Date   HGBA1C 10.1 (A) 03/16/2023   HGBA1C 10.8 (H) 03/10/2021   HGBA1C 12.1 (H) 10/05/2018   HGBA1C 12.2 (H) 10/04/2018   HGBA1C 11.3 (H) 12/30/2016      Diabetes Self-Management Education - 04/05/23 1400       Visit Information   Visit Type First/Initial      Initial Visit   Diabetes Type Type 1    Date Diagnosed 1994    Are you currently following a meal plan? No   interested and asked for a carb couting book that was provided today   Are you taking your medications as prescribed? Yes      Health Coping   How would you rate your overall health? --   need to assess at follow up     Psychosocial Assessment   Patient Belief/Attitude about Diabetes Motivated to manage diabetes   newly insured   What is the hardest part about your diabetes right now, causing you the most concern, or is the most worrisome to you about your diabetes?   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 2 - Rarely    What is the last grade level you completed in school? need to assess at follow up   need to assess at follow up     Pre-Education Assessment   Patient understands the diabetes disease and treatment process. Comprehends key points    Patient understands incorporating nutritional management into lifestyle. Needs Review    Patient undertands incorporating physical activity into lifestyle. Comprehends key points    Patient understands using medications safely. Needs Review    Patient understands monitoring blood glucose, interpreting and using results Needs Instruction    Patient understands prevention, detection, and treatment of acute complications. Needs Review    Patient understands prevention, detection, and treatment of chronic complications. Compreheands key points    Patient understands how to develop strategies to address psychosocial issues. Comprehends key points    Patient understands how to develop strategies to promote health/change behavior. Comprehends key points      Complications   Last HgB  A1C per patient/outside source 10.1 %    How often do you check your blood sugar? 0 times/day (not testing)    Fasting Blood glucose range (mg/dL) --   need to assess at follow up   Postprandial Blood glucose range (mg/dL) --   need to assess at follow up   Number of hypoglycemic episodes per month 2    Can you tell when your blood sugar is low? --   having a more difficult time to pick it up early   Number of hyperglycemic episodes ( >200mg /dL): --   need to assess at follow up   Have you had a dilated eye exam in the past 12 months? No   see sticky notes   Have you had a dental exam in the past 12 months?  --   need to assess at follow up   Are you checking your feet? --   need to assess at follow up     Dietary Intake   Breakfast need to assess at follow up      Activity / Exercise   Activity / Exercise Type ADL's;Light (walking / raking leaves)    How many days per week do you exercise? --   need to assess at follow up     Patient Education   Previous Diabetes Education Yes (please comment)    Medications Reviewed patients medication for diabetes, action, purpose, timing of dose and side effects.    Monitoring Taught/evaluated CGM (comment)    Acute complications Discussed and identified patients' prevention, symptoms, and treatment of hyperglycemia.;Taught prevention, symptoms, and  treatment of hypoglycemia - the 15 rule.      Individualized Goals (developed by patient)   Monitoring  Consistenly use CGM      Post-Education Assessment   Patient understands incorporating nutritional management into lifestyle. Needs Review    Patient understands using medications safely. Needs Review    Patient understands monitoring blood glucose, interpreting and using results Comprehends key points    Patient understands prevention, detection, and treatment of acute complications. Comprehends key points      Outcomes   Expected Outcomes Demonstrated interest in learning. Expect positive outcomes    Future DMSE Other (comment)   3 weeks   Program Status Not Completed             Individualized Plan for Diabetes Self-Management Training:   Learning Objective:  Patient will have a greater understanding of diabetes self-management. Patient education plan is to attend individual and/or group sessions per assessed needs and concerns.   Plan:   Patient Instructions  Thank you for your visit today!  We talked about:   Having prescriptions for testing supplies (meter, strips and lancets and syringes) Continuous glucose monitoring  Goals to work on:   Learning how to use the data your new  Continuous glucose monitoring is providing for you!  Please feel free to call me anytime.  [name] [phone]   Expected Outcomes:  Demonstrated interest in learning. Expect positive outcomes  Education material provided: Carbohydrate counting sheet and Diabetes Resources  If problems or questions, patient to contact team via:  Phone and Email  Future DSME appointment: Other (comment) (3 weeks) [name] Hole, RD 04/05/2023 3:16 PM.

## 2023-04-06 NOTE — Telephone Encounter (Signed)
 Decision:Approved  Jaysion Ramseyer (Key: [id number]) PA Case ID #: [id number] Rx #: [id number] Need Help? Call us  at [phone] Outcome Approved on January 7 by [employer] Medicaid 2017 Approved. DEXCOM G7 SENSOR Misc is approved from 03/31/2023 to 10/03/2023. Authorization Expiration Date: 10/03/2023 Drug Dexcom G7 Sensor ePA cloud logo Form [employer] Electronic Prior Authorization Form Original Claim Info 75

## 2023-04-13 ENCOUNTER — Telehealth: Payer: Self-pay | Admitting: Dietician

## 2023-04-13 NOTE — Telephone Encounter (Signed)
 sensor fell and could not get refill. He was advised that PA has been approved and to contact Dexcom for a replacement sensor. Pharmacy called problem resolved and prescription cleared for pickup

## 2023-04-21 ENCOUNTER — Encounter: Payer: Self-pay | Admitting: Student

## 2023-04-25 ENCOUNTER — Ambulatory Visit (INDEPENDENT_AMBULATORY_CARE_PROVIDER_SITE_OTHER): Payer: Medicaid Other | Admitting: Dietician

## 2023-04-25 ENCOUNTER — Encounter: Payer: Self-pay | Admitting: Dietician

## 2023-04-25 VITALS — Wt 216.3 lb

## 2023-04-25 DIAGNOSIS — E1010 Type 1 diabetes mellitus with ketoacidosis without coma: Secondary | ICD-10-CM | POA: Diagnosis not present

## 2023-04-25 NOTE — Patient Instructions (Addendum)
Thank you for your visit!  Step 1- get rid of lows as much possible- you said you will lower your evening of insulin to 30 units and continue the morning insulin of 40 units, until you switch over to new therapy assuming your doctors approve it.    Step 2- work on highs- consider learning how t correct your high blood sugars  Steps 3- talk about dosing meal time based on carbs being eaten.  Lower second dose of insulin to prevent low blood sugars  Take 40/30 instead of 40/40 U  Can start taking [place] as long as its been 12-18 hrs since his last dose of insulin Take [name] once a day as discussed Take rapid acting (Novolog) 12 units, 15 min before breakfast and dinner  We'll also ask for prescriptions for meter and supplies, Baqsimi ( glucagon) , may update prescriptions for syringes.  Follow up in February  Raymond [name] [phone]

## 2023-04-25 NOTE — Progress Notes (Unsigned)
Has Doctor appointment- March 18 216.3#, increased 6#, may be due to hypoglycemia treatment Rx for insulins, increase syringes,  Needs meter and supplies    CGM Results from download:   % Time CGM active:   94.2 %   (Goal >70%)  Average glucose:   192 mg/dL for 12 days  Glucose management indicator:   7.9 %  Time in range (70-180 mg/dL):   40 %   (Goal >16%)  Time High (181-250 mg/dL):   17 %   (Goal < 10%)  Time Very High (>250 mg/dL):    30 %   (Goal < 5%)  Time Low (54-69 mg/dL):   7 %   (Goal <9%)  Time Very Low (<54 mg/dL):   6 %   (Goal <6%)  Coefficient of variation:   54.1 %   (Goal <36%)     Diabetes Self-Management Education  Visit Type: Follow-up (1st after initial)  Appt. Start Time: 300 Appt. End Time: 420  04/25/2023  [name], identified by name and date of birth, is a 38 y.o. male with a diagnosis of Diabetes:  .   ASSESSMENT  Weight 216 lb 4.8 oz (98.1 kg). Body mass index is 31.04 kg/m.   Diabetes Self-Management Education - 04/25/23 1500       Visit Information   Visit Type Follow-up   1st after initial     Health Coping   How would you rate your overall health? --   did not assess today     Complications   How often do you check your blood sugar? > 4 times/day   continuously   Fasting Blood glucose range (mg/dL) <04;54-098    Postprandial Blood glucose range (mg/dL) >119;147-829;562-130    Number of hypoglycemic episodes per month --   too many - see CGM data   Can you tell when your blood sugar is low? No   says he is starting to not know when his blood sugar in the 40s   Number of hyperglycemic episodes ( >200mg /dL): Daily    Can you tell when your blood sugar is high? --   did not assess today   Have you had a dilated eye exam in the past 12 months? Yes    Have you had a dental exam in the past 12 months? --   did not assess today     Dietary Intake   Breakfast 10 am weekdats large # c cereal rice krispies or honey nut cheerios,  whole milk   weekedns goes to fast food and gets biscuit with hash browns and water   Lunch 2 Pm snack of either trai mix, fruit or , chips protein pack    Dinner 7 PM- 1- 2 packs of ramen with fish like oysters or sardines or salmon and veggies      Activity / Exercise   Activity / Exercise Type ADL's;Light (walking / raking leaves)      Patient Education   Previous Diabetes Education Yes (please comment)   as a child and here   Disease Pathophysiology Explored patient's options for treatment of their diabetes    Healthy Eating Food label reading, portion sizes and measuring food.;Role of diet in the treatment of diabetes and the relationship between the three main macronutrients and blood glucose level   rec carb counting   Medications Reviewed patients medication for diabetes, action, purpose, timing of dose and side effects.    Monitoring Taught/evaluated CGM (comment)  Acute complications Taught prevention, symptoms, and  treatment of hypoglycemia - the 15 rule.      Individualized Goals (developed by patient)   Medications take my medication as prescribed    Monitoring  Consistenly use CGM      Patient Self-Evaluation of Goals - Patient rates self as meeting previously set goals (% of time)   Monitoring >75% (most of the time)      Outcomes   Expected Outcomes Demonstrated interest in learning but significant barriers to change    Future DMSE 4-6 wks    Program Status Not Completed      Subsequent Visit   Since your last visit have you continued or begun to take your medications as prescribed? Yes    Since your last visit have you had your blood pressure checked? No    Since your last visit have you experienced any weight changes? Gain    Weight Gain (lbs) 6    Since your last visit, are you checking your blood glucose at least once a day? Yes   sensor malfunctioned or fell off, but he got a new one and placd and has 10 consecutaves days of blood sugar data             Individualized Plan for Diabetes Self-Management Training:   Learning Objective:  Patient will have a greater understanding of diabetes self-management. Patient education plan is to attend individual and/or group sessions per assessed needs and concerns.   Plan:   Patient Instructions  Thank you for your visit!  Step 1- get rid of lows as much possible- you said you will lower your evening of insulin to 30 units and continue the morning insulin of 40 units, until you switch over to new therapy assuming your doctors approve it.    Step 2- work on highs- consider learning how t correct your high blood sugars  Steps 3- talk about dosing meal time based on carbs being eaten.  Lower second dose of insulin to prevent low blood sugars  Take 40/30 instead of 40/40 U  Can start taking [place] as long as its been 12-18 hrs since his last dose of insulin Take Raymond [name] once a day as discussed Take rapid acting (Novolog) 12 units, 15 min before breakfast and dinner  We'll also ask for prescriptions for meter and supplies, Baqsimi ( glucagon) , may update prescriptions for syringes.  Follow up in February  Raymond [name] [phone]     Expected Outcomes:  Demonstrated interest in learning but significant barriers to change  Education material provided: Diabetes Resources  If problems or questions, patient to contact team via:  Phone and Email  Future DSME appointment: 4-6 wks Raymond [name], RD 04/25/2023 4:37 PM.

## 2023-04-26 ENCOUNTER — Other Ambulatory Visit: Payer: Self-pay | Admitting: Dietician

## 2023-04-26 DIAGNOSIS — E1010 Type 1 diabetes mellitus with ketoacidosis without coma: Secondary | ICD-10-CM

## 2023-04-26 MED ORDER — ACCU-CHEK GUIDE W/DEVICE KIT: PACK | 1 refills | Status: AC

## 2023-04-26 MED ORDER — INSULIN ASPART 100 UNIT/ML IJ SOLN: 12.0000 [IU] | Freq: Two times a day (BID) | INTRAMUSCULAR | 11 refills | Status: DC

## 2023-04-26 MED ORDER — ACCU-CHEK SOFTCLIX LANCETS MISC: 12 refills | Status: AC

## 2023-04-26 MED ORDER — INSULIN DEGLUDEC 100 UNIT/ML ~~LOC~~ SOLN: 36.0000 [IU] | Freq: Every day | SUBCUTANEOUS | 12 refills | Status: DC

## 2023-04-26 MED ORDER — "INSULIN SYRINGE-NEEDLE U-100 31G X 15/64"" 0.5 ML MISC": 3 refills | Status: AC

## 2023-04-26 MED ORDER — ACCU-CHEK GUIDE TEST VI STRP: ORAL_STRIP | 12 refills | Status: AC

## 2023-04-26 MED ORDER — BAQSIMI TWO PACK 3 MG/DOSE NA POWD: NASAL | 6 refills | Status: AC

## 2023-04-26 NOTE — Telephone Encounter (Signed)
He would like to change his insulin therapy to basal and bolus insulins due to both excessive hypoglycemia and hyperglycemia with current regimen.( See Diabetes Self Management Education & Support note from 04/25/23 for CGM data)  Suggest 36 units of Tresiba once daily and 12 units of Novolog 15 minutes before breakfast and dinner.  He also requests vials for his insulin and more syringes as well as a meter and supplies.

## 2023-04-27 ENCOUNTER — Telehealth: Payer: Self-pay

## 2023-04-27 NOTE — Telephone Encounter (Signed)
Prior Authorization for patient Raymond [name] 100UNIT/ML solution) came through on cover my meds was submitted with last office notes and labs awaiting approval or denial.  KEY: [id number]

## 2023-04-27 NOTE — Telephone Encounter (Signed)
Decision:Approved  [name] (Key: [id number]) PA Case ID #: [id number] Rx #: [id number] Need Help? Call us at [phone] Outcome Approved today by [employer] 2017 Approved. TRESIBA 100UNIT/ML Solution is approved from 04/27/2023 to 04/26/2024. Authorization Expiration Date: 04/26/2024 Drug Raymond [name] 100UNIT/ML solution ePA cloud logo Form [employer] Electronic Prior Authorization Form Original Claim Info 75

## 2023-05-03 DIAGNOSIS — F1920 Other psychoactive substance dependence, uncomplicated: Secondary | ICD-10-CM | POA: Diagnosis not present

## 2023-05-05 ENCOUNTER — Telehealth: Payer: Self-pay | Admitting: Dietician

## 2023-05-05 DIAGNOSIS — F1920 Other psychoactive substance dependence, uncomplicated: Secondary | ICD-10-CM | POA: Diagnosis not present

## 2023-05-05 NOTE — Telephone Encounter (Signed)
 Call to patient to assess how he doing with new insulin  regimen Left voicemail for return call

## 2023-05-12 DIAGNOSIS — F1920 Other psychoactive substance dependence, uncomplicated: Secondary | ICD-10-CM | POA: Diagnosis not present

## 2023-05-17 ENCOUNTER — Ambulatory Visit (INDEPENDENT_AMBULATORY_CARE_PROVIDER_SITE_OTHER): Payer: Medicaid Other | Admitting: Dietician

## 2023-05-17 ENCOUNTER — Encounter: Payer: Self-pay | Admitting: Dietician

## 2023-05-17 VITALS — Wt 221.3 lb

## 2023-05-17 DIAGNOSIS — E1010 Type 1 diabetes mellitus with ketoacidosis without coma: Secondary | ICD-10-CM

## 2023-05-17 DIAGNOSIS — E109 Type 1 diabetes mellitus without complications: Secondary | ICD-10-CM

## 2023-05-17 DIAGNOSIS — F1920 Other psychoactive substance dependence, uncomplicated: Secondary | ICD-10-CM | POA: Diagnosis not present

## 2023-05-17 NOTE — Patient Instructions (Addendum)
It looks like your blood sugar are improving.  We need to work on decreasing the low blood sugars.  Please only take Novolog 12 units about 20-30 minutes before eating a meal twice daily  Please pick up the [place] (Long acting insulin) today and begin taking 36 units daily.  We can talk about learning how to adjust your Novolog to fix a high blood sugar at a follow visit and if approved by your doctor.  Lupita Leash [phone]

## 2023-05-17 NOTE — Progress Notes (Signed)
Asking about using Metformin  Wt Readings from Last 10 Encounters:  05/17/23 221 lb 4.8 oz (100.4 kg)  04/25/23 216 lb 4.8 oz (98.1 kg)  04/05/23 210 lb 4.8 oz (95.4 kg)  03/16/23 203 lb 4.8 oz (92.2 kg)  12/28/22 191 lb (86.6 kg)  01/19/22 190 lb (86.2 kg)  06/11/21 180 lb (81.6 kg)  05/14/21 180 lb (81.6 kg)  05/12/21 183 lb (83 kg)  04/23/21 183 lb (83 kg)   Diabetes Self-Management Education  Visit Type: Follow-up (2nd after initial)  Appt. Start Time: 1515 Appt. End Time: 1415  05/17/2023  [name], identified by name and date of birth, is a 38 y.o. male with a diagnosis of Diabetes:  .   ASSESSMENT  Weight 221 lb 4.8 oz (100.4 kg). Body mass index is 31.75 kg/m.  It appears overall blood sugar is improving. Hypoglycemia is above goal, work on taking Novolog before eating only. He has not been taking any basal insulin. CGM Results from download:   % Time CGM active:   82.4 %   (Goal >70%)  Average glucose:   231 mg/dL for 14 days  Glucose management indicator:   8.8 %  Time in range (70-180 mg/dL):   31 %   (Goal >57%)  Time High (181-250 mg/dL):   19 %   (Goal < 84%)  Time Very High (>250 mg/dL):    44 %   (Goal < 5%)  Time Low (54-69 mg/dL):   2 %   (Goal <6%)  Time Very Low (<54 mg/dL):   4 %   (Goal <9%)  Coefficient of variation:   47.3 %   (Goal <36%)      Diabetes Self-Management Education - 05/17/23 1600       Visit Information   Visit Type Follow-up   2nd after initial     Health Coping   How would you rate your overall health? Fair   has been using cough syrup regulalry and states it affects ow he feels and his blood sugar as well     Dietary Intake   Breakfast discussed eating about 100-120 g carb per two meals a day      Activity / Exercise   Activity / Exercise Type ADL's;Light (walking / raking leaves)    How many days per week do you exercise? 7    How many minutes per day do you exercise? 60    Total minutes per week of exercise  420      Patient Education   Previous Diabetes Education Yes (please comment)    Healthy Eating Carbohydrate counting    Medications Reviewed medication adjustment guidelines for hyperglycemia and sick days.;Reviewed patients medication for diabetes, action, purpose, timing of dose and side effects.;Other (comment)   stressed importance of reducing episodes of hypoglycemia by only tkaing Novolog before meals   Monitoring Taught/evaluated CGM (comment)   educated about interpreting CGM data/report   Acute complications Taught prevention, symptoms, and  treatment of hypoglycemia - the 15 rule.      Individualized Goals (developed by patient)   Medications take my medication as prescribed    Monitoring  Consistenly use CGM      Patient Self-Evaluation of Goals - Patient rates self as meeting previously set goals (% of time)   Medications 25 - 50% (sometimes)    Monitoring >75% (most of the time)      Post-Education Assessment   Patient understands incorporating nutritional management into lifestyle. Needs  Review    Patient understands using medications safely. Needs Review    Patient understands monitoring blood glucose, interpreting and using results Comprehends key points    Patient understands prevention, detection, and treatment of acute complications. Comprehends key points      Outcomes   Expected Outcomes Demonstrated interest in learning but significant barriers to change    Future DMSE 4-6 wks    Program Status Completed      Subsequent Visit   Since your last visit have you continued or begun to take your medications as prescribed? No   he did not pick up [place], has only been using Novolog 12 units twice daily and often took it to lower a high blood sugar and then did not eat or ae 2 hours later   Since your last visit have you had your blood pressure checked? No    Since your last visit have you experienced any weight changes? Gain    Weight Gain (lbs) 5    Since your last  visit, are you checking your blood glucose at least once a day? Yes   using dexcom CGM            Individualized Plan for Diabetes Self-Management Training:   Learning Objective:  Patient will have a greater understanding of diabetes self-management. Patient education plan is to attend individual and/or group sessions per assessed needs and concerns.   Plan:   Patient Instructions  It looks like your blood sugar are improving.  We need to work on decreasing the low blood sugars.  Please only take Novolog 12 units about 20-30 minutes before eating a meal twice daily  Please pick up the [place] (Long acting insulin) today and begin taking 36 units daily.  We can talk about learning how to adjust your Novolog to fix a high blood sugar at a follow visit and if approved by your doctor.  Lupita Leash [phone]     Expected Outcomes:  Demonstrated interest in learning but significant barriers to change  Education material provided: Diabetes Resources  If problems or questions, patient to contact team via:  Phone and Email  Future DSME appointment: 4-6 wks Norm Parcel, RD 05/17/2023 4:40 PM.

## 2023-05-23 ENCOUNTER — Telehealth: Payer: Self-pay | Admitting: Dietician

## 2023-05-26 DIAGNOSIS — F1920 Other psychoactive substance dependence, uncomplicated: Secondary | ICD-10-CM | POA: Diagnosis not present

## 2023-05-26 NOTE — Telephone Encounter (Signed)
 I left a message for the patient to return my call.

## 2023-06-02 DIAGNOSIS — F1920 Other psychoactive substance dependence, uncomplicated: Secondary | ICD-10-CM | POA: Diagnosis not present

## 2023-06-07 DIAGNOSIS — F1920 Other psychoactive substance dependence, uncomplicated: Secondary | ICD-10-CM | POA: Diagnosis not present

## 2023-06-09 DIAGNOSIS — F1920 Other psychoactive substance dependence, uncomplicated: Secondary | ICD-10-CM | POA: Diagnosis not present

## 2023-06-14 DIAGNOSIS — F1920 Other psychoactive substance dependence, uncomplicated: Secondary | ICD-10-CM | POA: Diagnosis not present

## 2023-06-15 ENCOUNTER — Ambulatory Visit (INDEPENDENT_AMBULATORY_CARE_PROVIDER_SITE_OTHER): Payer: Self-pay | Admitting: Dietician

## 2023-06-15 ENCOUNTER — Ambulatory Visit: Payer: Medicaid Other | Admitting: Student

## 2023-06-15 ENCOUNTER — Encounter: Payer: Self-pay | Admitting: Student

## 2023-06-15 VITALS — BP 159/107 | HR 89 | Temp 97.8°F | Ht 70.0 in | Wt 216.4 lb

## 2023-06-15 DIAGNOSIS — I10 Essential (primary) hypertension: Secondary | ICD-10-CM

## 2023-06-15 DIAGNOSIS — E1010 Type 1 diabetes mellitus with ketoacidosis without coma: Secondary | ICD-10-CM | POA: Diagnosis not present

## 2023-06-15 DIAGNOSIS — E785 Hyperlipidemia, unspecified: Secondary | ICD-10-CM

## 2023-06-15 LAB — POCT GLYCOSYLATED HEMOGLOBIN (HGB A1C): Hemoglobin A1C: 7.8 % — AB (ref 4.0–5.6)

## 2023-06-15 LAB — GLUCOSE, CAPILLARY: Glucose-Capillary: 131 mg/dL — ABNORMAL HIGH (ref 70–99)

## 2023-06-15 MED ORDER — INSULIN DEGLUDEC 100 UNIT/ML ~~LOC~~ SOLN: 24.0000 [IU] | Freq: Every day | SUBCUTANEOUS | 12 refills | Status: AC

## 2023-06-15 MED ORDER — AMLODIPINE BESY-BENAZEPRIL HCL 5-40 MG PO CAPS
1.0000 | ORAL_CAPSULE | Freq: Every day | ORAL | 3 refills | Status: AC
Start: 2023-06-15 — End: ?

## 2023-06-15 MED ORDER — INSULIN ASPART 100 UNIT/ML IJ SOLN: 10.0000 [IU] | Freq: Two times a day (BID) | INTRAMUSCULAR | 11 refills | Status: DC

## 2023-06-15 NOTE — Patient Instructions (Addendum)
 Thank you for your visit today!  We talked about:   See chart for Duration of action on insulin   Goals to work on:  You said you are going to stop taking the 70/30 insulin   You are going to take the 25 units Tresiba daily and Novolog 10 units twice daily 15-20 minutes before your largest meals.   Please feel free to call me anytime.  Lupita Leash [phone] [email or web address]

## 2023-06-15 NOTE — Assessment & Plan Note (Signed)
 Not at goal. BP 159/107. He takes his lisinopril 40 daily. Will add a CCB and change his ACE to enable a combo pill. - Change lisinopril to lotrel (benazepril + amlodipine) 40-5 daily

## 2023-06-15 NOTE — Progress Notes (Addendum)
 Wt Readings from Last 10 Encounters:  06/15/23 216 lb 6.4 oz (98.2 kg)  05/17/23 221 lb 4.8 oz (100.4 kg)  04/25/23 216 lb 4.8 oz (98.1 kg)  04/05/23 210 lb 4.8 oz (95.4 kg)  03/16/23 203 lb 4.8 oz (92.2 kg)  12/28/22 191 lb (86.6 kg)  01/19/22 190 lb (86.2 kg)  06/11/21 180 lb (81.6 kg)  05/14/21 180 lb (81.6 kg)  05/12/21 183 lb (83 kg)    Lab Results  Component Value Date   HGBA1C 7.8 (A) 06/15/2023   HGBA1C 10.1 (A) 03/16/2023   HGBA1C 10.8 (H) 03/10/2021   HGBA1C 12.1 (H) 10/05/2018   HGBA1C 12.2 (H) 10/04/2018    Took 20 units 70/30 today at noon and only ate a pack of chips because he didn't want his blood sugar to be high when he came to see the doctor. Last night he took 30 units Tresiba and 20 units of 70/30 insulin. We discussed duration of action of different insulins and a chart was given to him with pictures of action of different insulins.patient's care was discussed with Dr. Ninfa Meeker today. Patient stated he was not ready for education about correction insulin. However, information was provided as he is currently kaing extra insulin to lower high blood sugars by guessing.  Novolog insulin wants to use pens.  Discussed dental and foot care today.   Diabetes Self-Management Education  Visit Type: Follow-up (3rd after initial)  Appt. Start Time: 1545 Appt. End Time: 1645  06/15/2023  [name], identified by name and date of birth, is a 38 y.o. male with a diagnosis of Diabetes:  .Type 1 diabetes   ASSESSMENT  Has had 175 hypoglycemia for the past 2 weeks. He states he was not aware he could get more Novolog so he bought a bottle of 70/30 and has been taking that for the past week. He also states that he takes extra insulin when his blood sugar is high or he is using cough medicine. We looked up the affects of his cough meicne on his blood sugars and determined that it most likely would not increase them as much as he thought it would or at all.    Diabetes  Self-Management Education - 06/15/23 1600       Health Coping   How would you rate your overall health? Fair   had low blood sugar in office of 42, treated with 4 glucose tablets and an Ensure. Blood sugar 90 w/ trend arrow up when leaving the office. agreed to stop taking 70/30 insulin  that he bought to use in place of Novolog. took 20 units 70/30 at noon today     Complications   Have you had a dental exam in the past 12 months? No   says he has rotting wisdon teeth   Are you checking your feet? Yes    How many days per week are you checking your feet? 5      Subsequent Visit   Since your last visit have you had your blood pressure checked? No    Since your last visit, are you checking your blood glucose at least once a day? Yes             Individualized Plan for Diabetes Self-Management Training:   Learning Objective:  Patient will have a greater understanding of diabetes self-management. Patient education plan is to attend individual and/or group sessions per assessed needs and concerns.   Plan:   Patient Instructions  Thank  you for your visit today!  We talked about:   See chart for Duration of action on insulin   Goals to work on:  You said you are going to stop taking the 70/30 insulin   You are going to take the 25 units Tresiba daily and Novolog 10 units twice daily 15-20 minutes before your largest meals.   Please feel free to call me anytime.  Raymond [name] [phone] [name]   Expected Outcomes:   fair  Education material provided: Diabetes Resources  If problems or questions, patient to contact team via:  Phone and Email  Future DSME appointment:  yearly [employer], RD 06/15/2023 5:08 PM.

## 2023-06-15 NOTE — Progress Notes (Signed)
   CC: T1DM and HTN followup  HPI:  [name] is a 38 y.o. male with a PMH stated below who presents today for checkup.  Please see problem based assessment and plan for additional details.  Past Medical History:  Diagnosis Date   Borderline personality disorder (HCC)    COVID    has had it twice, last time was in 2022, mild case 2nd time, worse on the first time   Diabetes mellitus without complication (HCC) 2014   Type 1   Gallstones    Headache    Hypertension    Substance abuse (HCC)    drug and alcohol abuse since age 19    Review of Systems: ROS negative except for what is noted on the assessment and plan.  Vitals:   06/15/23 1504 06/15/23 1528  BP: (!) 151/97 (!) 159/107  Pulse: 98 89  Temp: 97.8 F (36.6 C)   TempSrc: Oral   SpO2: 100%   Weight: 216 lb 6.4 oz (98.2 kg)   Height: 5\' 10"  (1.778 m)     Physical Exam: Constitutional: well-appearing man in no acute distress HENT: normocephalic atraumatic, mucous membranes moist Eyes: conjunctiva non-erythematous Cardiovascular: regular rate and rhythm, no m/r/g Pulmonary/Chest: normal work of breathing on room air, lungs clear to auscultation bilaterally MSK: normal bulk and tone Neurological: alert & oriented x 3, no focal deficit Skin: warm and dry Psych: normal mood and behavior  Assessment & Plan:   Patient discussed with Dr. Cleda Daub  Type 1 diabetes mellitus with ketoacidosis without coma (HCC) He is without complaint but notes some lower blood sugar over the past few months. On review, it looks like upwards of 20% of his measurements are hypoglycemic. There have been no accidents. No symptoms. However this is too much. A1c is lower today, 7.8 down from 10.1. Given this, will adjust his insulin as follows. - Change Tresiba 36 to 24 units daily - Change Novolog 12 BID to 10-12 units BID - ACR today, eye exam ref today  Hypertension Not at goal. BP 159/107. He takes his lisinopril 40  daily. Will add a CCB and change his ACE to enable a combo pill. - Change lisinopril to lotrel (benazepril + amlodipine) 40-5 daily  Hyperlipidemia Recent lipid profile was elevated and at that time he was not taking his statin. He is now adherent with his atorvastatin 20 daily. Can consider repeat lipids in future visit given LDL above 200.  RTC 1 month for Blood pressure check.  Katheran James, D.O. [facility] Internal Medicine, PGY-1 Phone: [phone] Date 06/15/2023 Time 4:34 PM

## 2023-06-15 NOTE — Assessment & Plan Note (Signed)
 He is without complaint but notes some lower blood sugar over the past few months. On review, it looks like upwards of 20% of his measurements are hypoglycemic. There have been no accidents. No symptoms. However this is too much. A1c is lower today, 7.8 down from 10.1. Given this, will adjust his insulin as follows. - Change Tresiba 36 to 24 units daily - Change Novolog 12 BID to 10-12 units BID - ACR today, eye exam ref today

## 2023-06-15 NOTE — Assessment & Plan Note (Addendum)
 Recent lipid profile was elevated and at that time he was not taking his statin. He is now adherent with his atorvastatin 20 daily. Can consider repeat lipids in future visit given LDL above 200.

## 2023-06-15 NOTE — Patient Instructions (Addendum)
 Change Tresiba to 24 units daily Change Novolog to 10-12 units twice daily Change lisinopril to lotrel (benazepril + amlodipine) 40-5 daily

## 2023-06-16 DIAGNOSIS — F1920 Other psychoactive substance dependence, uncomplicated: Secondary | ICD-10-CM | POA: Diagnosis not present

## 2023-06-16 LAB — MICROALBUMIN / CREATININE URINE RATIO
Creatinine, Urine: 51.2 mg/dL
Microalb/Creat Ratio: 2970 mg/g{creat} — ABNORMAL HIGH (ref 0–29)
Microalbumin, Urine: 1520.7 ug/mL

## 2023-06-16 MED ORDER — NOVOLOG FLEXPEN 100 UNIT/ML ~~LOC~~ SOPN: 10.0000 [IU] | PEN_INJECTOR | Freq: Two times a day (BID) | SUBCUTANEOUS | 11 refills | Status: AC

## 2023-06-16 MED ORDER — PEN NEEDLES 32G X 4 MM MISC: 5 refills | Status: AC

## 2023-06-16 NOTE — Addendum Note (Signed)
 Addended by: Katheran James on: 06/16/2023 09:11 AM   Modules accepted: Orders

## 2023-06-21 DIAGNOSIS — F1920 Other psychoactive substance dependence, uncomplicated: Secondary | ICD-10-CM | POA: Diagnosis not present

## 2023-06-24 NOTE — Progress Notes (Signed)
 Internal Medicine Clinic Attending  Case discussed with the resident at the time of the visit.  We reviewed the resident's history and exam and pertinent patient test results.  I agree with the assessment, diagnosis, and plan of care documented in the resident's note.

## 2023-06-24 NOTE — Addendum Note (Signed)
 Addended by: Gust Rung on: 06/24/2023 09:53 PM   Modules accepted: Level of Service

## 2023-06-28 DIAGNOSIS — F1920 Other psychoactive substance dependence, uncomplicated: Secondary | ICD-10-CM | POA: Diagnosis not present

## 2023-06-30 DIAGNOSIS — F1920 Other psychoactive substance dependence, uncomplicated: Secondary | ICD-10-CM | POA: Diagnosis not present

## 2023-07-05 DIAGNOSIS — F1920 Other psychoactive substance dependence, uncomplicated: Secondary | ICD-10-CM | POA: Diagnosis not present

## 2023-07-07 DIAGNOSIS — F1920 Other psychoactive substance dependence, uncomplicated: Secondary | ICD-10-CM | POA: Diagnosis not present

## 2023-12-12 ENCOUNTER — Telehealth: Payer: Self-pay

## 2023-12-12 NOTE — Telephone Encounter (Signed)
 Prior Authorization for patient (Dexcom G7 Sensor) came through on cover my meds was submitted with last office notes and labs awaiting approval or denial.  XZB:[id number]

## 2023-12-12 NOTE — Telephone Encounter (Signed)
 Raymond [name] (Key: [id number]) PA Case ID #: [id number] Rx #: [id number] Need Help? Call us  at [phone] Outcome Approved today by [employer] 2017 Approved. DEXCOM G7 SENSOR Misc is approved from 12/12/2023 to 12/11/2024. Effective Date: 12/12/2023 Authorization Expiration Date: 12/11/2024 Drug Dexcom G7 Sensor ePA cloud logo Form [employer] Electronic Prior Authorization Form Original Claim Info 75

## 2024-02-13 ENCOUNTER — Ambulatory Visit: Admitting: Student

## 2024-04-16 ENCOUNTER — Telehealth: Payer: Self-pay

## 2024-04-16 NOTE — Telephone Encounter (Signed)
 Received a request from the pharmacy requesting a prior authorization for Tresiba  100UNIT/ML solution.   Patient last seen 06/15/23 I called the patient to schedule a appointment. I was unable to reach the patient. I lvm for him to give us  a call back.

## 2024-04-20 ENCOUNTER — Other Ambulatory Visit (HOSPITAL_COMMUNITY): Payer: Self-pay

## 2024-04-20 NOTE — Telephone Encounter (Signed)
 No active coverage found.   Will need new insurance card AND patient will need appt.
